# Patient Record
Sex: Female | Born: 1950
Health system: Southern US, Community
[De-identification: ages and names within clinical notes are randomized; demographics above are authoritative.]

## PROBLEM LIST (undated history)

## (undated) DIAGNOSIS — I10 Essential (primary) hypertension: Secondary | ICD-10-CM

## (undated) DIAGNOSIS — G5603 Carpal tunnel syndrome, bilateral upper limbs: Secondary | ICD-10-CM

## (undated) DIAGNOSIS — R112 Nausea with vomiting, unspecified: Secondary | ICD-10-CM

## (undated) DIAGNOSIS — I480 Paroxysmal atrial fibrillation: Secondary | ICD-10-CM

## (undated) DIAGNOSIS — Z95818 Presence of other cardiac implants and grafts: Secondary | ICD-10-CM

## (undated) DIAGNOSIS — K219 Gastro-esophageal reflux disease without esophagitis: Secondary | ICD-10-CM

## (undated) DIAGNOSIS — C801 Malignant (primary) neoplasm, unspecified: Secondary | ICD-10-CM

## (undated) DIAGNOSIS — M1990 Unspecified osteoarthritis, unspecified site: Secondary | ICD-10-CM

## (undated) DIAGNOSIS — I493 Ventricular premature depolarization: Secondary | ICD-10-CM

## (undated) DIAGNOSIS — H409 Unspecified glaucoma: Secondary | ICD-10-CM

## (undated) DIAGNOSIS — T4145XA Adverse effect of unspecified anesthetic, initial encounter: Secondary | ICD-10-CM

## (undated) DIAGNOSIS — E119 Type 2 diabetes mellitus without complications: Secondary | ICD-10-CM

## (undated) DIAGNOSIS — Z9889 Other specified postprocedural states: Secondary | ICD-10-CM

## (undated) HISTORY — PX: BREAST LUMPECTOMY: SHX2

## (undated) HISTORY — PX: RETINAL DETACHMENT SURGERY: SHX105

## (undated) HISTORY — PX: OTHER SURGICAL HISTORY: SHX169

## (undated) HISTORY — DX: Ventricular premature depolarization: I49.3

## (undated) HISTORY — DX: Gastro-esophageal reflux disease without esophagitis: K21.9

## (undated) HISTORY — PX: CATARACT EXTRACTION: SUR2

## (undated) HISTORY — PX: EYE SURGERY: SHX253

## (undated) HISTORY — DX: Paroxysmal atrial fibrillation: I48.0

## (undated) HISTORY — DX: Essential (primary) hypertension: I10

## (undated) HISTORY — PX: TUBAL LIGATION: SHX77

---

## 1991-12-18 HISTORY — PX: BREAST EXCISIONAL BIOPSY: SUR124

## 1994-12-17 HISTORY — PX: BREAST EXCISIONAL BIOPSY: SUR124

## 2003-02-25 ENCOUNTER — Encounter: Admission: RE | Admit: 2003-02-25 | Discharge: 2003-02-25 | Payer: Self-pay | Admitting: *Deleted

## 2003-09-15 ENCOUNTER — Encounter: Admission: RE | Admit: 2003-09-15 | Discharge: 2003-09-15 | Payer: Self-pay | Admitting: Family Medicine

## 2003-09-15 ENCOUNTER — Encounter: Payer: Self-pay | Admitting: Family Medicine

## 2004-02-28 ENCOUNTER — Encounter: Admission: RE | Admit: 2004-02-28 | Discharge: 2004-02-28 | Payer: Self-pay | Admitting: Family Medicine

## 2005-09-12 ENCOUNTER — Encounter: Admission: RE | Admit: 2005-09-12 | Discharge: 2005-09-12 | Payer: Self-pay | Admitting: Family Medicine

## 2006-06-26 ENCOUNTER — Encounter: Admission: RE | Admit: 2006-06-26 | Discharge: 2006-06-26 | Payer: Self-pay | Admitting: Family Medicine

## 2007-07-16 ENCOUNTER — Encounter: Admission: RE | Admit: 2007-07-16 | Discharge: 2007-07-16 | Payer: Self-pay | Admitting: Family Medicine

## 2007-07-25 ENCOUNTER — Encounter: Admission: RE | Admit: 2007-07-25 | Discharge: 2007-07-25 | Payer: Self-pay | Admitting: Family Medicine

## 2007-08-28 ENCOUNTER — Ambulatory Visit: Payer: Self-pay | Admitting: Cardiology

## 2007-09-11 ENCOUNTER — Ambulatory Visit: Payer: Self-pay | Admitting: Cardiology

## 2007-12-15 ENCOUNTER — Ambulatory Visit: Payer: Self-pay | Admitting: Cardiology

## 2008-02-27 ENCOUNTER — Encounter: Payer: Self-pay | Admitting: Cardiology

## 2008-03-16 ENCOUNTER — Ambulatory Visit: Payer: Self-pay | Admitting: Cardiology

## 2008-08-27 ENCOUNTER — Ambulatory Visit: Payer: Self-pay | Admitting: Cardiology

## 2008-08-30 ENCOUNTER — Encounter: Payer: Self-pay | Admitting: Cardiology

## 2008-09-03 ENCOUNTER — Encounter: Payer: Self-pay | Admitting: Cardiology

## 2008-09-30 ENCOUNTER — Ambulatory Visit: Payer: Self-pay | Admitting: Cardiology

## 2008-09-30 ENCOUNTER — Encounter: Payer: Self-pay | Admitting: Cardiology

## 2008-10-25 ENCOUNTER — Encounter: Payer: Self-pay | Admitting: Cardiology

## 2008-10-27 ENCOUNTER — Encounter: Admission: RE | Admit: 2008-10-27 | Discharge: 2008-10-27 | Payer: Self-pay | Admitting: Family Medicine

## 2009-04-20 ENCOUNTER — Encounter: Payer: Self-pay | Admitting: Cardiology

## 2009-05-17 ENCOUNTER — Ambulatory Visit: Payer: Self-pay | Admitting: Cardiology

## 2009-09-29 DIAGNOSIS — I10 Essential (primary) hypertension: Secondary | ICD-10-CM | POA: Insufficient documentation

## 2009-09-29 DIAGNOSIS — I2510 Atherosclerotic heart disease of native coronary artery without angina pectoris: Secondary | ICD-10-CM | POA: Insufficient documentation

## 2009-09-29 DIAGNOSIS — I4891 Unspecified atrial fibrillation: Secondary | ICD-10-CM | POA: Insufficient documentation

## 2009-11-01 ENCOUNTER — Encounter: Admission: RE | Admit: 2009-11-01 | Discharge: 2009-11-01 | Payer: Self-pay | Admitting: Family Medicine

## 2010-01-09 ENCOUNTER — Ambulatory Visit: Payer: Self-pay | Admitting: Cardiology

## 2010-05-01 ENCOUNTER — Encounter: Payer: Self-pay | Admitting: Cardiology

## 2010-08-10 ENCOUNTER — Encounter: Admission: RE | Admit: 2010-08-10 | Discharge: 2010-11-08 | Payer: Self-pay | Admitting: Orthopedic Surgery

## 2010-11-15 ENCOUNTER — Encounter: Admission: RE | Admit: 2010-11-15 | Discharge: 2010-11-15 | Payer: Self-pay | Admitting: Family Medicine

## 2011-01-16 NOTE — Medication Information (Signed)
Summary: RX Folder/ DILTIAZEM  RX Folder/ DILTIAZEM   Imported By: Dorise Hiss 05/02/2010 08:54:33  _____________________________________________________________________  External Attachment:    Type:   Image     Comment:   External Document

## 2011-01-16 NOTE — Assessment & Plan Note (Signed)
Summary: 6 MONTH FU RECV LETTER VS   Visit Type:  Follow-up Primary Provider:  Dimas Aguas  CC:  follow-up visit.  History of Present Illness: the patient is a 60 year old female is referred to split fibrillation.  The patient reports no recurrence.  She did do one occasion have to take Cardizem due to palpitations.  This was under significant stress which had to fly to [place] become her stepson with a myocardial infarction.  She denies otherwise any shortness of breath orthopnea PND shows no palpitations or syncope.  She previously had a normal stress echocardiogram.  Clinical Review Panels:  CXR CXR results Normal heart size and vascularity.  Mild hyperinflation         without focal pneumonia, edema, effusion or pneumothorax.  Trachea         is midline.  Atherosclerosis of the aorta.                   IMPRESSION:         Stable hyperinflation.  No acute chest process. (10/25/2008)  Stress Echocardiogram Stress Echocardiogram Conclusions:         1. With stress there was no chest pain. There was no EKG change. There         was no ectopy.         2. The rest echo images reveal normal global LV function. There is EF         is 60-65% with no focal wall abnormalities. With stress there is         increase in contractility and thickening in all segements. There is         decrease in cavity size in in all views. There is increased EF.         3. This is a normal stress echo. There is no ischemia. (09/30/2008)    Preventive Screening-Counseling & Management  Alcohol-Tobacco     Smoking Status: quit     Year Quit: 1978  Current Medications (verified): 1)  Diltiazem Hcl 30 Mg Tabs (Diltiazem Hcl) .... Take 1 Tablet By Mouth As Needed Palpitations 2)  Metoprolol Tartrate 25 Mg Tabs (Metoprolol Tartrate) .... Take 1 Tablet By Mouth Two Times A Day 3)  Aspir-Trin 325 Mg Tbec (Aspirin) .... Take 1 Tablet By Mouth Once A Day 4)  Centrum Silver  Tabs (Multiple Vitamins-Minerals) ....  Take 1 Tablet By Mouth Once A Day 5)  Caltrate 600+d 600-400 Mg-Unit Tabs (Calcium Carbonate-Vitamin D) .... Take 1 Tablet By Mouth Two Times A Day 6)  Fish Oil 1000 Mg Caps (Omega-3 Fatty Acids) .... Take 2 Tablet By Mouth Two Times A Day 7)  Omeprazole 40 Mg Cpdr (Omeprazole) .... Take 1 Tablet By Mouth Once A Day 8)  Simvastatin 20 Mg Tabs (Simvastatin) .... Take 1 Tablet By Mouth Once A Day 9)  Lisinopril-Hydrochlorothiazide 20-25 Mg Tabs (Lisinopril-Hydrochlorothiazide) .... Take 1 Tablet By Mouth Once A Day 10)  Istalol 0.5 % Soln (Timolol Maleate) .... One Gtt Ou Every Am 11)  Proventil Hfa 108 (90 Base) Mcg/act Aers (Albuterol Sulfate) .... As Needed 12)  Lorazepam 1 Mg Tabs (Lorazepam) .... As Needed 13)  Zolpidem Tartrate 5 Mg Tabs (Zolpidem Tartrate) .... As Needed  Allergies (verified): No Known Drug Allergies  Comments:  Nurse/Medical Assistant: The patient's medications and allergies were reviewed with the patient and were updated in the Medication and Allergy Lists. List reviewed.  Past History:  Past Medical History: Last updated: 09/29/2009 HYPERTENSION, UNSPECIFIED (  ICD-401.9) CAD, NATIVE VESSEL (ICD-414.01) ATRIAL FIBRILLATION (ICD-427.31) GERD Asthma  Past Surgical History: Last updated: 09/29/2009 tubal maxiofacial eye Cataract Extraction Lumpectomy  Family History: Last updated: 09/29/2009 Family History of Cancer:  CHF  Social History: Last updated: 09/29/2009 Retired  Married  Tobacco Use - Former.  Alcohol Use - no Regular Exercise - yes Drug Use - no  Risk Factors: Exercise: yes (09/29/2009)  Risk Factors: Smoking Status: quit (01/09/2010)  Review of Systems       The patient complains of palpitations.  The patient denies fatigue, malaise, fever, weight gain/loss, vision loss, decreased hearing, hoarseness, chest pain, shortness of breath, prolonged cough, wheezing, sleep apnea, coughing up blood, abdominal pain, blood in stool,  nausea, vomiting, diarrhea, heartburn, incontinence, blood in urine, muscle weakness, joint pain, leg swelling, rash, skin lesions, headache, fainting, dizziness, depression, anxiety, enlarged lymph nodes, easy bruising or bleeding, and environmental allergies.    Vital Signs:  Patient profile:   60 year old female Height:      64 inches Weight:      162 pounds BMI:     27.91 Pulse rate:   69 / minute BP sitting:   109 / 72  (left arm) Cuff size:   regular  Vitals Entered By: Carlye Grippe (January 09, 2010 3:02 PM)  Nutrition Counseling: Patient's BMI is greater than 25 and therefore counseled on weight management options. CC: follow-up visit   Physical Exam  Additional Exam:  General: Well-developed, well-nourished in no distress head: Normocephalic and atraumatic eyes PERRLA/EOMI intact, conjunctiva and lids normal nose: No deformity or lesions mouth normal dentition, normal posterior pharynx neck: Supple, no JVD.  No masses, thyromegaly or abnormal cervical nodes lungs: Normal breath sounds bilaterally without wheezing.  Normal percussion heart: regular rate and rhythm with normal S1 and S2, no S3 or S4.  PMI is normal.  No pathological murmurs abdomen: Normal bowel sounds, abdomen is soft and nontender without masses, organomegaly or hernias noted.  No hepatosplenomegaly musculoskeletal: Back normal, normal gait muscle strength and tone normal pulsus: Pulse is normal in all 4 extremities Extremities: No peripheral pitting edema neurologic: Alert and oriented x 3 skin: Intact without lesions or rashes cervical nodes: No significant adenopathy psychologic: Normal affect    Impression & Recommendations:  Problem # 1:  ATRIAL FIBRILLATION (ICD-427.31) patient's EKG today's within normal limits.  She may have had a single recurrence of atrial fibrillation but the p.r.n. Cardizem with resolution.  I made no further medical adjustments. Her updated medication list for this  problem includes:    Metoprolol Tartrate 25 Mg Tabs (Metoprolol tartrate) .Marland Kitchen... Take 1 tablet by mouth two times a day    Aspir-trin 325 Mg Tbec (Aspirin) .Marland Kitchen... Take 1 tablet by mouth once a day  Orders: EKG w/ Interpretation (93000)  Problem # 2:  HYPERTENSION, UNSPECIFIED (ICD-401.9) blood pressure well controlled on her current medical regimen. Her updated medication list for this problem includes:    Diltiazem Hcl 30 Mg Tabs (Diltiazem hcl) .Marland Kitchen... Take 1 tablet by mouth as needed palpitations    Metoprolol Tartrate 25 Mg Tabs (Metoprolol tartrate) .Marland Kitchen... Take 1 tablet by mouth two times a day    Aspir-trin 325 Mg Tbec (Aspirin) .Marland Kitchen... Take 1 tablet by mouth once a day    Lisinopril-hydrochlorothiazide 20-25 Mg Tabs (Lisinopril-hydrochlorothiazide) .Marland Kitchen... Take 1 tablet by mouth once a day  Problem # 3:  CAD, NATIVE VESSEL (ICD-414.01) the patient has a history of nonobstructive coronary artery disease diagnosed previously by  cardiac CT scan in 2007. Her updated medication list for this problem includes:    Diltiazem Hcl 30 Mg Tabs (Diltiazem hcl) .Marland Kitchen... Take 1 tablet by mouth as needed palpitations    Metoprolol Tartrate 25 Mg Tabs (Metoprolol tartrate) .Marland Kitchen... Take 1 tablet by mouth two times a day    Aspir-trin 325 Mg Tbec (Aspirin) .Marland Kitchen... Take 1 tablet by mouth once a day    Lisinopril-hydrochlorothiazide 20-25 Mg Tabs (Lisinopril-hydrochlorothiazide) .Marland Kitchen... Take 1 tablet by mouth once a day  Patient Instructions: 1)  Your physician recommends that you continue on your current medications as directed. Please refer to the Current Medication list given to you today. 2)  Your physician wants you to follow-up in: 1year. You will receive a reminder letter in the mail about two months in advance. If you don't receive a letter, please call our office to schedule the follow-up appointment.

## 2011-01-16 NOTE — Procedures (Signed)
Summary: Holter and Event/ [facility]ARD[facility] END OF SERVICE SUMMARY REPORT  Holter and Event/ CARDIONET END OF SERVICE SUMMARY REPORT   Imported By: Dorise Hiss 01/09/2010 09:24:29  _____________________________________________________________________  External Attachment:    Type:   Image     Comment:   External Document

## 2011-01-16 NOTE — Miscellaneous (Signed)
Summary: Refill:diltiazem  Clinical Lists Changes  Medications: Changed medication from DILTIAZEM HCL 30 MG TABS (DILTIAZEM HCL) Take 1 tablet by mouth to DILTIAZEM HCL 30 MG TABS (DILTIAZEM HCL) Take 1 tablet by mouth as needed palpitations - Signed Rx of DILTIAZEM HCL 30 MG TABS (DILTIAZEM HCL) Take 1 tablet by mouth as needed palpitations;  #30 x 3;  Signed;  Entered by: Cyril Loosen, RN, BSN;  Authorized by: Lewayne Bunting, MD, [facility];  Method used: Electronically to [facility] [address], [place], [place], [place]  [address], Ph: [phone], Fax: [phone]    Prescriptions: DILTIAZEM HCL 30 MG TABS (DILTIAZEM HCL) Take 1 tablet by mouth as needed palpitations  #30 x 3   Entered by:   Cyril Loosen, RN, BSN   Authorized by:   Lewayne Bunting, MD, [facility]   Signed by:   Cyril Loosen, RN, BSN on 05/17/2009   Method used:   Electronically to        [facility] (retail)       [address]       [place], [place]  [address]       Ph: [phone]       Fax: [phone]   RxID:   [id number]

## 2011-05-01 NOTE — Assessment & Plan Note (Signed)
[facility]                          [facility] NOTE   [facility], [facility]                           MRN:          [id number]  DATE:12/15/2007                            DOB:          1951-05-05    REFERRING PHYSICIAN:  Selinda Flavin   HISTORY OF PRESENT ILLNESS:  The patient is a pleasant 60 year old  female with a history of minimal coronary plaquing by cardiac CT in  October of 2007.  The patient was admitted on September 11 with  paroxysmal atrial fibrillation.  She has seen [place] in followup.  In the  meantime, she was placed on metoprolol ER 12.5 mg p.o. daily.  Since  this last office visit, the patient reports on October 7, episodes of  atrial fibrillation, which lasted 2 to 3 months.  Dr. Dimas Aguas saw the  patient in followup on October 8 and increased her metoprolol ER to 25  mg p.o. daily.  She also had a gallbladder ultrasound done in the  meanwhile, which showed a liver lesion, for which she is receiving  further workup at the present time.  However, she has minimal symptoms  as it relates to atrial fibrillation with very rare palpitations.  The  patient states that she is concerned about this.  She, however, has no  chest pain with this or shortness of breath, and it appears that she is  somewhat anxious about her arrhythmia.   MEDICATIONS:  1. Zocor 20 mg p.o. daily.  2. Aspirin 81 mg p.o. daily.  3. Centrum Silver.  4. Calcium.  5. Vitamin D.  6. Fish oil 2000 mg p.o. b.i.d.  7. Metoprolol 25 mg p.o. daily.  8. Omeprazole 40 mg p.o. daily.   PHYSICAL EXAMINATION:  VITAL SIGNS:  Blood pressure 129/76, heart rate  68, weight 155 pounds.  NECK:  Normal carotid upstroke.  No carotid bruits.  LUNGS:  Clear breath sounds bilaterally.  HEART:  Regular rate and rhythm.  Normal S1, S2.  No murmurs, rubs, or  gallops.  ABDOMEN:  Soft and nontender.  No rebound or guarding.  Good bowel  sounds.  EXTREMITIES:  No cyanosis, clubbing,  or edema.  NEURO:  The patient is alert and oriented, grossly nonfocal.   PROBLEM LIST:  1. Paroxysmal atrial fibrillation in normal sinus rhythm.  2. [place] score is 0.  3. Left ventricular function, ejection fraction 55-65%.  4. Treated dyslipidemia.  5. Mild coronary plaquing by cardiac CT.  6. Gastroesophageal reflux disease.  7. Glaucoma.  8. Asthma.  9. Palpitations.  10.Atypical chest pain.   PLAN:  1. The patient's arrhythmia is reasonably well controlled.  She has      very rare palpitations.  2. I told the patient that we could potentially escalate her medical      therapy, particularly if her symptoms are more nocturnal, for which      we can add low-dose Cardizem at night.  If her symptoms are more      frequent and symptomatic, consideration can be given to      antiarrhythmic  drug therapy or catheter radiofrequency ablation.      On the latter issue, I have given the patient some information,      although I told her she is clearly not a candidate for this      procedure yet.  3. The patient can follow up with [place] in the next 6 months to 1 year.     Learta Codding, MD,FACC  Electronically Signed    GED/MedQ  DD: 12/15/2007  DT: 12/15/2007  Job #: [id number]

## 2011-05-01 NOTE — Assessment & Plan Note (Signed)
[facility]                          [facility] NOTE   [facility], [facility]                           MRN:          [id number]  DATE:08/27/2008                            DOB:          [birth year]    PRIMARY CARDIOLOGIST:  Learta Codding, MD, [facility]   REASON FOR VISIT:  Scheduled followup.   [facility] continues to do extremely well, since last seen here in the  clinic in March 2009.  She has not had any interim development of tachy  palpitations.  She continues to exercise on a fairly regular basis, with  no associated chest discomfort or significant dyspnea.   [facility] has been formally evaluated for possible diabetes mellitus and  informs me that this workup was negative.  Thus, her CHADS2 score  remains at 1, secondary to hypertension.   MEDICATIONS:  1. Aspirin 325 daily.  2. Metoprolol 25 mg q.a.m./12.5 mg q.p.m.  3. Diltiazem 30 mg p.r.n.  4. Fish oil 2000 mg b.i.d.  5. Zocor 20 mg nightly.   PHYSICAL EXAMINATION:  VITAL SIGNS:  Blood pressure 122/69, pulse 69 and  regular, and weight 160.  GENERAL:  A 60 year old female, sitting upright, in no distress.  HEENT:  Normocephalic and atraumatic.  NECK:  Palpable bilateral carotid pulses without bruits; no JVD.  LUNGS:  Clear to auscultation in all fields.  HEART:  Regular rate and rhythm.  No significant murmurs.  ABDOMEN:  Soft, nontender.  EXTREMITIES:  Palpable pulses without edema.  NEUROLOGIC:  No focal deficits.   IMPRESSION:  1. History of paroxysmal atrial fibrillation.      a.     CHAD2 score: 1.  2. Normal left ventricular function.  3. Hypertension.  4. History of nonobstructive coronary artery disease.      a.     By cardiac CT scan, October 2007.  5. Gastroesophageal reflux disease.  6. Asthma.   PLAN:  1. Adjust metoprolol to a dosing regimen of 25 mg b.i.d.  I do not      feel that her current manner of taking this as 12.5 mg in the      evening is providing very  much benefit.  Moreover, I am concerned      as to the reliability of such a small dose, given that she breaks      these down from an initial tablet of 50 mg.  2. Return clinic followup with myself and Dr. Andee Lineman in 6 months, or      sooner as needed.      Gene Serpe, PA-C  Electronically Signed      Learta Codding, MD,FACC  Electronically Signed   GS/MedQ  DD: 08/27/2008  DT: 08/28/2008  Job #: [id number]   cc:   [facility]

## 2011-05-01 NOTE — Assessment & Plan Note (Signed)
[facility]                          [facility] NOTE   PARMINDER, TRAPANI                           MRN:          [id number]  DATE:09/11/2007                            DOB:          03-03-1951    CARDIOLOGIST:  Dr. Lewayne Bunting.   PRIMARY CARE PHYSICIAN:  Dr. Selinda Flavin.   REASON FOR VISIT:  Post-hospitalization follow up.   HISTORY OF PRESENT ILLNESS:  [name] is a very pleasant 60 year old  female patient with history of minimal coronary plaquing by cardiac CT  done in October 2007, with less than 25% LAD stenosis and a calcium  score of 57, as well as a negative adequate exercise Cardiolite study in  July 2005 who presented to [facility] recently with complaints  of chest pain and tachy-palpitations. She was found in atrial  fibrillation with RVR of new onset. She converted to normal sinus rhythm  on a Diltiazem drip. She ruled out for myocardial infarction by enzymes.  She returns today for follow up.   The patient notes that shortly after discharge, she was taking Diltiazem  and developed a yellowish skin discoloration. Her primary care physician  told her to stop the medication and she was sent to the hospital for  blood work. Of note, her complete metabolic panel was normal, as well as  her CBC.   Today, she notes that she is doing well. She has not had any recurrent  tachy-palpitations like she did when she presented to the hospital. She  has had occasional palpitations that she describes as a flip-flopping.  She also notes atypical that she has had for years. Dr. Dimas Aguas has  actually set her up for a gallbladder ultrasound which was done today.  He has also placed her on omeprazole. She has noticed some association  with meals and has also noted some increased belching. She denies any  exertional chest heaviness or tightness. She denies any syncope. She  denies any significant dyspnea on exertion. She describes NYHA  class 1  to 2 symptoms.   CURRENT MEDICATIONS:  1. Zocor 20 mg daily.  2. Aspirin 81 mg daily.  3. Centrum Silver.  4. Calcium.  5. Fish oil.  6. Azopt eye drops.  7. Tretinoin cream.  8. Omeprazole 20 mg daily.  9. Ambien p.r.n.  10.Lorazepam p.r.n.  11.Albuterol p.r.n.   ALLERGIES:  Questionable allergy to DILTIAZEM.   PHYSICAL EXAMINATION:  GENERAL:  She is a well developed, well nourished  female in no acute distress.  VITAL SIGNS:  Blood pressure 127/80, pulse 67, weight 153 pounds.  HEENT:  Normal.  NECK:  Without JVD.  CARDIAC:  S1, S2. Regular rate and rhythm without murmurs.  LUNGS:  Clear to auscultation bilaterally without wheezing, rhonchi, or  rales.  ABDOMEN:  Soft and nontender with normal active bowel sounds. No  organomegaly.  EXTREMITIES:  Without edema.   Electrocardiogram reveals sinus rhythm with a heart rate of 65, normal  axis, no acute changes.   IMPRESSION:  1. Paroxysmal atrial fibrillation.      a.  Maintaining normal sinus rhythm.  2. CHADS2 score is 0.  3. Good LV function with an EF of 55% to 65%.  4. Treated dyslipidemia.  5. Minimal coronary plaquing by cardiac CT done in October 2007.      a.     Less than 25% LAD stenosis; calcium score 57; otherwise       normal coronary arteries.  6. GERD.  7. Glaucoma.  8. Asthma.  9. Palpitations.  10.Chest pain, etiology unclear.   PLAN:  The patient presents to the office today for follow up. She is  maintaining normal sinus rhythm. She continues to have atypical chest  pain. Dr. Dimas Aguas has set her up for a gallbladder ultrasound, as well as  placed her on omeprazole. She did have problems with Diltiazem. At this  point in time, we plan to:  1. Initiate Toprol XL 25 mg 1/2 tablet daily to try to maintain normal      sinus rhythm, as well as to treat her palpitations. If she cannot      tolerate this low dose of beta blocker than we will discontinue it.  2. Continue follow up with Dr.  Dimas Aguas for other causes of her chest      pain and risk factor      modification.  3. Follow up with Dr. Andee Lineman in three months or sooner p.r.n.      Tereso Newcomer, PA-C  Electronically Signed      Learta Codding, MD,FACC  Electronically Signed   SW/MedQ  DD: 09/11/2007  DT: 09/12/2007  Job #: [id number]   cc:   Selinda Flavin

## 2011-05-01 NOTE — Assessment & Plan Note (Signed)
[facility]                          [facility] NOTE   Heather [facility], Heather Cunningham                           MRN:          [id number]  DATE:03/16/2008                            DOB:          1951/05/09    PRIMARY CARDIOLOGIST:  Dr. Lewayne Bunting.   REASON FOR VISIT:  Post-hospital followup.   Heather Cunningham is a very pleasant 60 year old female, with history of  nonobstructive coronary artery disease and paroxysmal atrial  fibrillation, who recently presented to [facility] with  recurrent PAF.  This is her second documented episode of PAF, the first  occurring in September 2008 at which time she was referred to [place] in  consultation here at [facility].  Her CHAD2 score at that time  was assessed as 0.  More recently, however, she has been diagnosed with  mild hypertension and is also currently undergoing evaluation for  possible type 2 diabetes mellitus.  She otherwise has no prior history  of congestive heart failure or stroke.   On February 10, she presented to the emergency room with complaint of  sustained palpitations.  She was found to be in PAF at a rate of 130  bpm, which successfully converted to NSR after treatment with a one-time  dose of 5 mg IV Lopressor.  She has since had up titration of her  maintenance metoprolol with an additional 25 milligrams nightly, per Dr.  Dimas Aguas.  This seems to have significantly reduced the number of  palpitations, but not completely eliminated them.   The patient was also referred for an outpatient CardioNet monitor, which  was completed, and which revealed no evidence of recurrent PAF with only  rare PVCs.   Clinically, the patient feels much improved and has not had any  sustained tachy palpitations similar to her recent presentation.   CURRENT MEDICATIONS:  1. Aspirin 81 mg daily.  2. Zocor 20 nightly.  3. Fish oil 2000 b.i.d.  4. Metoprolol 50 mg q. a.m. 50 mg nightly.  5. Omeprazole 40  mg daily.   PHYSICAL EXAM:  Blood pressure 143/87, pulse 62, regular weight 157.  GENERAL:  A 60 year old female sitting upright in no distress.  HEENT:  Normocephalic, atraumatic.  NECK:  Palpable bilateral carotid pulses without bruits; no JVD.  LUNGS:  Clear to auscultation in all fields.  HEART:  Regular rate and rhythm (S1, S2).  No significant murmurs.  No  rubs.  ABDOMEN:  Benign.  EXTREMITIES:  Palpable pulses without edema.  NEURO:  No focal deficit.   IMPRESSION:  1. Recurrent paroxysmal atrial fibrillation with rapid ventricular      response.      a.     Successfully converted to normal sinus rhythm with IV       Lopressor.      b.     CHADS2 score:  at least 1, with possible type 2 diabetes       mellitus.  2. Normal ventricular function.      a.     Ejection fraction 55-60% by 2-D echo, September 2008.  3. Hypertension, new onset.  4. Nonobstructive coronary artery disease.      a.     By cardiac CT, October 2007.  5. Gastroesophageal reflux disease.  6. Asthma.   PLAN:  1. Increase aspirin to full-dose.  2. Add Diltiazem 30 mg p.r.n. for recurrent, sustained a tachy      palpitations.  3. Closely monitor for recurrent paroxysmal atrial fibrillation and,      if the patient is formally diagnosed with type 2 diabetes mellitus,      consider Coumadin anticoagulation.  Her CHAD2 score at that point      would be 2.  4. Schedule return clinic followup with myself and Dr. Andee Lineman in 2      months.      Gene Serpe, PA-C  Electronically Signed      Learta Codding, MD,FACC  Electronically Signed   GS/MedQ  DD: 03/16/2008  DT: 03/16/2008  Job #: [id number]   cc:   Selinda Flavin

## 2011-05-01 NOTE — Assessment & Plan Note (Signed)
[facility]                          [facility]ICE NOTE   Heather Cunningham, Heather Cunningham                           MRN:          [id number]  DATE:05/17/2009                            DOB:          1951/09/17    REFERRING PHYSICIAN:  Selinda Flavin   HISTORY OF PRESENT ILLNESS:  The patient is a 60 year old female with a  history of paroxysmal atrial fibrillation without any recent recurrence.  The patient did have episodes of severe hypertension associated with  headache last year.  She was referred for a CT scan which was normal.  Dr. Dimas Aguas also scheduled her for a stress echocardiogram which  demonstrated normal LV function and no segmental wall motion  abnormalities as the study was negative for ischemia.  The patient has  now been doing well.  She has been placed on combination therapy with  lisinopril and hydrochlorothiazide.  She does state that she feels  fatigued at times and contributes this to metoprolol.  She has had no  recurrent palpitations, syncope, orthopnea, PND, or chest pain.   MEDICATIONS:  1. Aspirin 325 mg p.o. daily.  2. Centrum Silver daily.  3. Calcium and vitamin D 5 mg p.o. b.i.d.  4. Fish oil 2000 mg p.o. b.i.d.  5. Eye drops.  6. Omeprazole 40 mg p.o. daily.  7. Simvastatin 20 mg p.o. nightly.  8. Metoprolol 50 mg p.o. b.i.d.  9. Lisinopril hydrochlorothiazide 20/25 mg p.o. daily.   PHYSICAL EXAMINATION:  VITAL SIGNS:  Blood pressure is 109/69, heart  rate is 56, weight 159 pounds.  NECK:  Normal carotid upstroke and no carotid bruits.  LUNGS:  Clear breath sounds bilaterally.  HEART:  Regular rate and rhythm.  Normal S1 and S2.  No murmurs, rubs,  or gallops.  ABDOMEN:  Soft and nontender.  No rebound or guarding.  Good bowel  sounds.  EXTREMITIES:  No cyanosis, clubbing, or edema.   PROBLEM LIST:  1. History of paroxysmal atrial fibrillation.      a.     CHADS2 score of 1.  2. Normal left ventricular function  with normal stress      echocardiographic study October 2009.  3. Hypertension controlled.  4. History of nonobstructive coronary artery disease by cardiac CT      scan, October 2007.  5. Gastroesophageal reflux disease.  6. Asthma.   PLAN:  1. The patient does complain of some fatigue and we will cut back her      metoprolol to 25 mg p.o. b.i.d.  2. I gave the patient a refill of Cardizem at 30 mg.  I told the      patient that if she feels better with lower dose of metoprolol      and/or she has recurrence of atrial fibrillation, we might switch      her to Cardizem altogether.  3. EKG was reviewed and is essentially within normal limits.  4. The patient to follow up with [place] in 6 months.     Learta Codding, MD,FACC  Electronically Signed    GED/MedQ  DD: 05/17/2009  DT: 05/18/2009  Job #: [id number]   cc:   Selinda Flavin

## 2011-05-31 ENCOUNTER — Encounter: Payer: Self-pay | Admitting: Cardiology

## 2011-07-09 ENCOUNTER — Encounter: Payer: Self-pay | Admitting: Cardiology

## 2011-07-13 ENCOUNTER — Ambulatory Visit: Payer: Self-pay | Admitting: Cardiology

## 2011-09-07 ENCOUNTER — Encounter: Payer: Self-pay | Admitting: Cardiology

## 2011-09-07 ENCOUNTER — Ambulatory Visit (INDEPENDENT_AMBULATORY_CARE_PROVIDER_SITE_OTHER): Payer: 59 | Admitting: Cardiology

## 2011-09-07 VITALS — BP 115/76 | HR 62 | Ht 64.0 in | Wt 165.1 lb

## 2011-09-07 DIAGNOSIS — I10 Essential (primary) hypertension: Secondary | ICD-10-CM

## 2011-09-07 DIAGNOSIS — I4891 Unspecified atrial fibrillation: Secondary | ICD-10-CM

## 2011-09-07 MED ORDER — DILTIAZEM HCL 30 MG PO TABS: 30.0000 mg | ORAL_TABLET | ORAL | Status: DC | PRN

## 2011-09-07 NOTE — Patient Instructions (Signed)
Your physician you to follow up in 1 year. You will receive a reminder letter in the mail one-two months in advance. If you don't receive a letter, please call our office to schedule the follow-up appointment. Your physician recommends that you continue on your current medications as directed. Please refer to the Current Medication list given to you today. 

## 2011-09-10 NOTE — Progress Notes (Signed)
HPI The patient is a 60 year old female with a history of paroxysmal atrial fibrillation. EKG today demonstrates normal sinus rhythm. The patient reports a single episode a month ago of rapid tachycardia palpitations and she took 2 short-acting Cardizem. Symptoms resolved and 45 minutes. Short of her risk factor of hypertension and female sex the patient has no other increased risk factors for thromboembolic disease. She has been screened for coronary arteries the cardiac CTA 2007 which was within normal limits. She also had an echo in the past which showed normal LV function. In addition a stress echocardiogram in 2009 was also within normal limits. From a cardiovascular standpoint the patient is otherwise stable. She denies any chest pain shortness of breath orthopnea or PND.  Not on File  Current Outpatient Prescriptions on File Prior to Visit  Medication Sig Dispense Refill  . albuterol (PROVENTIL HFA) 108 (90 BASE) MCG/ACT inhaler Inhale 2 puffs into the lungs as needed.        Marland Kitchen aspirin 325 MG EC tablet Take 325 mg by mouth daily.        . Calcium Carbonate-Vitamin D (CALTRATE 600+D) 600-400 MG-UNIT per tablet Take 1 tablet by mouth 2 (two) times daily.        . fish oil-omega-3 fatty acids 1000 MG capsule Take 2 g by mouth 2 (two) times daily.        Marland Kitchen lisinopril-hydrochlorothiazide (PRINZIDE,ZESTORETIC) 20-25 MG per tablet Take 1 tablet by mouth daily.        Marland Kitchen LORazepam (ATIVAN) 1 MG tablet Take 1 mg by mouth as needed.        . metoprolol tartrate (LOPRESSOR) 25 MG tablet Take 25 mg by mouth 2 (two) times daily.        . Multiple Vitamins-Minerals (CENTRUM SILVER) tablet Take 1 tablet by mouth daily.        Marland Kitchen omeprazole (PRILOSEC) 40 MG capsule Take 40 mg by mouth daily.        . simvastatin (ZOCOR) 20 MG tablet Take 20 mg by mouth daily.        . Timolol Maleate (ISTALOL) 0.5 % (DAILY) SOLN Apply 1 drop to eye every morning. In both eyes.       Marland Kitchen zolpidem (AMBIEN) 10 MG tablet Take 5  mg by mouth as needed.          Past Medical History  Diagnosis Date  . Hypertension     Unspecified  . Coronary artery disease     Native vessel  . Arrhythmia     Atrial fibrillation  . Asthma   . GERD (gastroesophageal reflux disease)     Past Surgical History  Procedure Date  . Tubal ligation   . Maxiofacial   . Eye surgery   . Cataract extraction   . Breast lumpectomy     Family History  Problem Relation Age of Onset  . Cancer Other   . Heart failure Other     History   Social History  . Marital Status: Married    Spouse Name: [name]    Number of Children: 1  . Years of Education: N/A   Occupational History  . [employer] Other   Social History Main Topics  . Smoking status: Former Smoker    Quit date: 12/17/1976  . Smokeless tobacco: Not on file   Comment: QUIT 35 YEARS AGO  . Alcohol Use: No  . Drug Use: No  . Sexually Active: Not on file   Other  Topics Concern  . Not on file   Social History Narrative   Pt gets regular exercise.   ZOX:[id number] positives as outlined above. The remainder of the 18  point review of systems is negative  PHYSICAL EXAM BP 115/76  Pulse 62  Ht 5\' 4"  (1.626 m)  Wt 165 lb 1.9 oz (74.898 kg)  BMI 28.34 kg/m2  General: Well-developed, well-nourished in no distress Head: Normocephalic and atraumatic Eyes:PERRLA/EOMI intact, conjunctiva and lids normal Ears: No deformity or lesions Mouth:normal dentition, normal posterior pharynx Neck: Supple, no JVD.  No masses, thyromegaly or abnormal cervical nodes Lungs: Normal breath sounds bilaterally without wheezing.  Normal percussion Cardiac: regular rate and rhythm with normal S1 and S2, no S3 or S4.  PMI is normal.  No pathological murmurs Abdomen: Normal bowel sounds, abdomen is soft and nontender without masses, organomegaly or hernias noted.  No hepatosplenomegaly MSK: Back normal, normal gait muscle strength and tone normal Vascular: Pulse is  normal in all 4 extremities Extremities: No peripheral pitting edema Neurologic: Alert and oriented x 3 Skin: Intact without lesions or rashes Lymphatics: No significant adenopathy Psychologic: Normal affect    ECG: Normal sinus rhythm.  ASSESSMENT AND PLAN

## 2011-09-10 NOTE — Assessment & Plan Note (Signed)
Paroxysmal atrial fibrillation: Rare episodes of atrial fibrillation controlled with short-acting Cardizem. We are giving refill of her short-acting Cardizem. At this point the patient has no clear indication for Coumadin anticoagulation or one of the newer anticoagulants. I told the patient however that when she reaches a 50 we may revise his recommendation particular she continues to have documented paroxysmal atrial fibrillation

## 2011-09-10 NOTE — Assessment & Plan Note (Signed)
Hypertension: Blood pressure well controlled on medical therapy

## 2011-11-06 ENCOUNTER — Other Ambulatory Visit: Payer: Self-pay | Admitting: Family Medicine

## 2011-11-06 DIAGNOSIS — Z1231 Encounter for screening mammogram for malignant neoplasm of breast: Secondary | ICD-10-CM

## 2011-11-23 ENCOUNTER — Ambulatory Visit
Admission: RE | Admit: 2011-11-23 | Discharge: 2011-11-23 | Disposition: A | Discharge status: Home / Self Care | Payer: 59 | Source: Ambulatory Visit | Attending: Family Medicine | Admitting: Family Medicine

## 2011-11-23 DIAGNOSIS — Z1231 Encounter for screening mammogram for malignant neoplasm of breast: Secondary | ICD-10-CM

## 2012-08-19 ENCOUNTER — Ambulatory Visit (INDEPENDENT_AMBULATORY_CARE_PROVIDER_SITE_OTHER): Payer: 59 | Admitting: Cardiology

## 2012-08-19 ENCOUNTER — Encounter: Payer: Self-pay | Admitting: Cardiology

## 2012-08-19 VITALS — BP 103/68 | HR 71 | Ht 64.0 in | Wt 165.0 lb

## 2012-08-19 DIAGNOSIS — I2510 Atherosclerotic heart disease of native coronary artery without angina pectoris: Secondary | ICD-10-CM

## 2012-08-19 DIAGNOSIS — I10 Essential (primary) hypertension: Secondary | ICD-10-CM

## 2012-08-19 DIAGNOSIS — I4891 Unspecified atrial fibrillation: Secondary | ICD-10-CM

## 2012-08-19 NOTE — Patient Instructions (Signed)
Continue all current medications

## 2012-08-23 NOTE — Assessment & Plan Note (Signed)
Blood pressure well-controlled.  No further adjustments in medications required.  LV function is also normal.

## 2012-08-23 NOTE — Assessment & Plan Note (Signed)
History of paroxysmal atrial fibrillation.  Patient remains in normal sinus rhythm.  She reports one episode of palpitations and Cardizem did not stop her symptoms.  She went to the emergency room and converted there spontaneously.  she has no other recurrences.risks benefits does not favor Coumadin therapy at this point in time

## 2012-08-23 NOTE — Progress Notes (Signed)
Heather Bottoms, MD, [facility] ABIM Board Certified in Adult Cardiovascular Medicine,Internal Medicine and Critical Care Medicine    CC:   Follow up patient with paroxysmal atrial fibrillation.                                                                               HPI:        The patient is a 61 year old female that is approximately fibrillation.  Since her last visit she reports one episode of palpitations which didn't stop with her usual dose of short-acting Cardizem.  The patient went to the emergency room  However by the time she got there her a defibrillation was resolved.  She has noticed some decrease in exercise tolerance but attributes to weight gain.  Otherwise she is asymptomatic and reports no chest pain or shortness of breath. Patient had prior ischemia workup with cardiac CEA 2007 as well as an echo which showed normal LV function stress echocardiogram in 2009 was also within normal limits.  PMH: reviewed and listed in Problem List in Electronic Records (and see below) Past Medical History  Diagnosis Date  . Hypertension     Unspecified  . Coronary artery disease     Native vessel  . Arrhythmia     Atrial fibrillation  . Asthma   . GERD (gastroesophageal reflux disease)    Past Surgical History  Procedure Date  . Tubal ligation   . Maxiofacial   . Eye surgery   . Cataract extraction   . Breast lumpectomy     Allergies/SH/FHX : available in Electronic Records for review  No Known Allergies History   Social History  . Marital Status: Married    Spouse Name: [name]    Number of Children: 1  . Years of Education: N/A   Occupational History  . [employer] Other   Social History Main Topics  . Smoking status: Former Smoker    Types: Cigarettes    Quit date: 12/17/1976  . Smokeless tobacco: Never Used   Comment: QUIT 35 YEARS AGO  . Alcohol Use: No  . Drug Use: No  . Sexually Active: Not on file   Other Topics Concern  . Not on file     Social History Narrative   Pt gets regular exercise.   Family History  Problem Relation Age of Onset  . Cancer Other   . Heart failure Other     Medications: Current Outpatient Prescriptions  Medication Sig Dispense Refill  . albuterol (PROAIR HFA) 108 (90 BASE) MCG/ACT inhaler Inhale 2 puffs into the lungs every 6 (six) hours as needed.      Marland Kitchen aspirin 325 MG EC tablet Take 325 mg by mouth daily.        . Calcium Carbonate-Vitamin D (CALTRATE 600+D) 600-400 MG-UNIT per tablet Take 1 tablet by mouth 2 (two) times daily.        Marland Kitchen diltiazem (CARDIZEM) 30 MG tablet Take 1 tablet (30 mg total) by mouth as needed. Take as needed for palpitations.  30 tablet  3  . fish oil-omega-3 fatty acids 1000 MG capsule Take 2 g by mouth 2 (two) times daily.        Marland Kitchen  lisinopril-hydrochlorothiazide (PRINZIDE,ZESTORETIC) 20-25 MG per tablet Take 1 tablet by mouth daily.        Marland Kitchen LORazepam (ATIVAN) 1 MG tablet Take 1 mg by mouth as needed.        . metoprolol tartrate (LOPRESSOR) 25 MG tablet Take 25 mg by mouth 2 (two) times daily.        . Multiple Vitamins-Minerals (CENTRUM SILVER) tablet Take 1 tablet by mouth daily.        Marland Kitchen omeprazole (PRILOSEC) 40 MG capsule Take 40 mg by mouth daily.        . simvastatin (ZOCOR) 20 MG tablet Take 20 mg by mouth daily.        . Timolol Maleate (ISTALOL) 0.5 % (DAILY) SOLN Apply 1 drop to eye every morning. In both eyes.       Marland Kitchen zolpidem (AMBIEN) 10 MG tablet Take 5 mg by mouth as needed.          ROS: No nausea or vomiting. No fever or chills.No melena or hematochezia.No bleeding.No claudication  Physical Exam: BP 103/68  Pulse 71  Ht 5\' 4"  (1.626 m)  Wt 165 lb (74.844 kg)  BMI 28.32 kg/m2 General:well-nourished white female in no distress Neck:normal carotid upstroke and no carotid bruits. Lungs:clear breath sounds bilaterally, no wheezing Cardiac:regular rate and rhythm with normal S1-S2, no pathological murmurs Vascular:no edema.  Normal distal  pulses Skin:warm and dry Physcologic:normal affect  12lead WUJ:WJXBJY sinus rhythm no acute ischemic changes. Limited bedside ECHO:N/A No images are attached to the encounter.   I reviewed and summarized the old records. I reviewed ECG and prior blood work.  Assessment and Plan  ATRIAL FIBRILLATION History of paroxysmal atrial fibrillation.  Patient remains in normal sinus rhythm.  She reports one episode of palpitations and Cardizem did not stop her symptoms.  She went to the emergency room and converted there spontaneously.  she has no other recurrences.risks benefits does not favor Coumadin therapy at this point in time  CAD, NATIVE VESSEL Patient reports no chest pain.  She has no orthopnea PND no symptoms to suggest ischemic heart disease and no further workup is required.  HYPERTENSION, UNSPECIFIED Blood pressure well-controlled.  No further adjustments in medications required.  LV function is also normal.    Patient Active Problem List  Diagnosis  . HYPERTENSION, UNSPECIFIED  . CAD, NATIVE VESSEL  . ATRIAL FIBRILLATION

## 2012-08-23 NOTE — Assessment & Plan Note (Signed)
Patient reports no chest pain.  She has no orthopnea PND no symptoms to suggest ischemic heart disease and no further workup is required.

## 2012-10-20 ENCOUNTER — Other Ambulatory Visit: Payer: Self-pay | Admitting: Family Medicine

## 2012-10-20 DIAGNOSIS — Z1231 Encounter for screening mammogram for malignant neoplasm of breast: Secondary | ICD-10-CM

## 2012-11-24 ENCOUNTER — Ambulatory Visit
Admission: RE | Admit: 2012-11-24 | Discharge: 2012-11-24 | Disposition: A | Discharge status: Home / Self Care | Payer: 59 | Source: Ambulatory Visit | Attending: Family Medicine | Admitting: Family Medicine

## 2012-11-24 DIAGNOSIS — Z1231 Encounter for screening mammogram for malignant neoplasm of breast: Secondary | ICD-10-CM

## 2013-07-05 DIAGNOSIS — I499 Cardiac arrhythmia, unspecified: Secondary | ICD-10-CM

## 2013-07-28 ENCOUNTER — Encounter: Payer: Self-pay | Admitting: Cardiovascular Disease

## 2013-07-28 DIAGNOSIS — I499 Cardiac arrhythmia, unspecified: Secondary | ICD-10-CM

## 2013-07-29 ENCOUNTER — Encounter: Payer: Self-pay | Admitting: Cardiovascular Disease

## 2013-08-14 ENCOUNTER — Ambulatory Visit (INDEPENDENT_AMBULATORY_CARE_PROVIDER_SITE_OTHER): Payer: 59 | Admitting: Cardiovascular Disease

## 2013-08-14 ENCOUNTER — Encounter: Payer: Self-pay | Admitting: Cardiovascular Disease

## 2013-08-14 VITALS — BP 123/80 | HR 60 | Ht 64.0 in | Wt 169.0 lb

## 2013-08-14 DIAGNOSIS — I10 Essential (primary) hypertension: Secondary | ICD-10-CM

## 2013-08-14 DIAGNOSIS — M47812 Spondylosis without myelopathy or radiculopathy, cervical region: Secondary | ICD-10-CM

## 2013-08-14 DIAGNOSIS — E876 Hypokalemia: Secondary | ICD-10-CM

## 2013-08-14 DIAGNOSIS — M722 Plantar fascial fibromatosis: Secondary | ICD-10-CM

## 2013-08-14 DIAGNOSIS — I480 Paroxysmal atrial fibrillation: Secondary | ICD-10-CM

## 2013-08-14 DIAGNOSIS — R7309 Other abnormal glucose: Secondary | ICD-10-CM

## 2013-08-14 DIAGNOSIS — I4891 Unspecified atrial fibrillation: Secondary | ICD-10-CM

## 2013-08-14 DIAGNOSIS — R079 Chest pain, unspecified: Secondary | ICD-10-CM

## 2013-08-14 MED ORDER — DILTIAZEM HCL 30 MG PO TABS: 30.0000 mg | ORAL_TABLET | ORAL | Status: DC | PRN

## 2013-08-14 NOTE — Patient Instructions (Addendum)
   Labs for Potassium & HgA1c - do prior to going out of town Office will contact with results via phone or letter.   21 day event heart monitor - call when return from [place] so order can be placed for this, monitor will be mailed to your home Refill sent to pharm for your Cardizem  Continue all current medications. Follow up appointment can be given at time of ordering monitor

## 2013-08-14 NOTE — Progress Notes (Signed)
Patient ID: Heather Cunningham, female   DOB: 10-Oct-1951, 62 y.o.   MRN: [id number]   SUBJECTIVE: Mrs. Jalloh has a h/o paroxysmal atrial fibrillation and HTN. She had a prior ischemia workup with a cardiac CEA in 2007 as well as an echo which showed normal LV function and stress echocardiogram in 2009 which was also within normal limits.   She's been having concomitant chest and back pain, which is made worse by holding her arms up when she's cooking or ironing. She has noticed that one arm may occasionally feel stronger than the other. She's not had xrays up to this point. She cannot have an MRI due to a metal band in her head after retinal surgery.  When she's in atrial fibrillation, she feels it in her throat. It used to occur once a year, but this month it happened three times. She's been instructed to take an extra Diltiazem tablet or two should it occur. She was recently hospitalized for this and her potassium level was 3.2. She eats tomatoes, bananas, and other potassium-rich foods.  She's been bothered by plantar fasciitis and has been unable to exercise like she would like to.   No Known Allergies  Current Outpatient Prescriptions  Medication Sig Dispense Refill  . albuterol (PROAIR HFA) 108 (90 BASE) MCG/ACT inhaler Inhale 2 puffs into the lungs every 6 (six) hours as needed.      Marland Kitchen aspirin 325 MG EC tablet Take 325 mg by mouth daily.        . Calcium Carbonate-Vitamin D (CALTRATE 600+D) 600-400 MG-UNIT per tablet Take 1 tablet by mouth 2 (two) times daily.        Marland Kitchen diltiazem (CARDIZEM) 30 MG tablet Take 1 tablet (30 mg total) by mouth as needed. Take as needed for palpitations.  30 tablet  3  . fish oil-omega-3 fatty acids 1000 MG capsule Take 2 g by mouth 2 (two) times daily.        Marland Kitchen lisinopril-hydrochlorothiazide (PRINZIDE,ZESTORETIC) 20-25 MG per tablet Take 1 tablet by mouth daily.        Marland Kitchen LORazepam (ATIVAN) 1 MG tablet Take 1 mg by mouth as needed.        . metoprolol tartrate  (LOPRESSOR) 25 MG tablet Take 50 mg by mouth 2 (two) times daily.       Marland Kitchen omeprazole (PRILOSEC) 40 MG capsule Take 40 mg by mouth daily.        . simvastatin (ZOCOR) 20 MG tablet Take 20 mg by mouth daily.        . Timolol Maleate (ISTALOL) 0.5 % (DAILY) SOLN Apply 1 drop to eye every morning. In both eyes.       Marland Kitchen zolpidem (AMBIEN) 10 MG tablet Take 5 mg by mouth as needed.         No current facility-administered medications for this visit.    Past Medical History  Diagnosis Date  . Hypertension     Unspecified  . Coronary artery disease     Native vessel  . Arrhythmia     Atrial fibrillation  . Asthma   . GERD (gastroesophageal reflux disease)     Past Surgical History  Procedure Laterality Date  . Tubal ligation    . Maxiofacial    . Eye surgery    . Cataract extraction    . Breast lumpectomy      History   Social History  . Marital Status: Married    Spouse Name: [name]  Number of Children: 1  . Years of Education: N/A   Occupational History  . [employer] Other   Social History Main Topics  . Smoking status: Former Smoker    Types: Cigarettes    Quit date: 12/17/1976  . Smokeless tobacco: Never Used     Comment: QUIT 35 YEARS AGO  . Alcohol Use: No  . Drug Use: No  . Sexual Activity: Not on file   Other Topics Concern  . Not on file   Social History Narrative   Pt gets regular exercise.    @FAMX @  Filed Vitals:   08/14/13 1343  BP: 123/80  Pulse: 60  Height: 5\' 4"  (1.626 m)  Weight: 169 lb (76.658 kg)    PHYSICAL EXAM General: NAD Neck: No JVD, no thyromegaly or thyroid nodule.  Lungs: Clear to auscultation bilaterally with normal respiratory effort. CV: Nondisplaced PMI.  Heart regular S1/S2, no S3/S4, no murmur.  No peripheral edema.  No carotid bruit.  Normal pedal pulses.  Abdomen: Soft, nontender, no hepatosplenomegaly, no distention.  Neurologic: Alert and oriented x 3.  Psych: Normal affect. Extremities: No  clubbing or cyanosis.   ECG: reviewed and available in electronic records.      ASSESSMENT AND PLAN: 1. Paroxysmal atrial fibrillation: it has occurred more frequently in the past month, but she was found to be hypokalemic. She had been on 25 mg bid of Metoprolol but it was recently increased to 50 mg bid. She does get somewhat fatigued with this. She is going to [place] from Sept 15-Oct 1. Afterwards, I will have her wear a 3-week event monitor and then have her f/u with me. I will also check a potassium level shortly before her trip, along with an HbA1C. She tells me that non-fasting blood sugars have been elevated. If she has proven diabetes, anticoagulation will be need to be considered, and she is aware of this.  2. HTN: controlled on current therapy. 3. Chest pain: this appears to be musculoskeletal in etiology, as it is clearly exacerbated by the use of her arms, and she may very well have cervical and thoracic spine arthritis/degenerative joint disease. I would recommend x-rays. I do not recommend stress testing at this time, and will continue to monitor this. 4. Cervical and thoracic spine arthritis: I recommend xrays for further evaluation. 5. Plantar fasciitis: she sees a podiatrist for this.    Prentice Docker, M.D., F.A.C.C.

## 2013-08-18 ENCOUNTER — Ambulatory Visit: Payer: 59 | Admitting: Cardiovascular Disease

## 2013-08-24 ENCOUNTER — Encounter: Payer: Self-pay | Admitting: *Deleted

## 2013-09-23 ENCOUNTER — Other Ambulatory Visit: Payer: 59 | Admitting: *Deleted

## 2013-09-23 DIAGNOSIS — I480 Paroxysmal atrial fibrillation: Secondary | ICD-10-CM

## 2013-09-24 DIAGNOSIS — I4891 Unspecified atrial fibrillation: Secondary | ICD-10-CM

## 2013-10-21 ENCOUNTER — Other Ambulatory Visit: Payer: Self-pay

## 2013-10-21 DIAGNOSIS — Z1231 Encounter for screening mammogram for malignant neoplasm of breast: Secondary | ICD-10-CM

## 2013-10-26 ENCOUNTER — Ambulatory Visit (INDEPENDENT_AMBULATORY_CARE_PROVIDER_SITE_OTHER): Payer: 59 | Admitting: Cardiovascular Disease

## 2013-10-26 ENCOUNTER — Encounter: Payer: Self-pay | Admitting: Cardiovascular Disease

## 2013-10-26 VITALS — BP 102/60 | HR 61 | Ht 64.0 in | Wt 166.0 lb

## 2013-10-26 DIAGNOSIS — I4891 Unspecified atrial fibrillation: Secondary | ICD-10-CM

## 2013-10-26 DIAGNOSIS — I480 Paroxysmal atrial fibrillation: Secondary | ICD-10-CM

## 2013-10-26 DIAGNOSIS — M47812 Spondylosis without myelopathy or radiculopathy, cervical region: Secondary | ICD-10-CM

## 2013-10-26 DIAGNOSIS — M722 Plantar fascial fibromatosis: Secondary | ICD-10-CM

## 2013-10-26 DIAGNOSIS — R079 Chest pain, unspecified: Secondary | ICD-10-CM

## 2013-10-26 DIAGNOSIS — I10 Essential (primary) hypertension: Secondary | ICD-10-CM

## 2013-10-26 NOTE — Patient Instructions (Signed)
Your physician recommends that you schedule a follow-up appointment in: 1 year with Dr. Purvis Sheffield. You should receive a letter in the mail in 10 months. If you do not receive this letter by September 2015 call our office to schedule this appointment.   Your physician recommends that you continue on your current medications as directed. Please refer to the Current Medication list given to you today.

## 2013-10-26 NOTE — Progress Notes (Signed)
Patient ID: Heather Cunningham, female   DOB: 1951-01-28, 62 y.o.   MRN: [id number]      SUBJECTIVE: Heather Cunningham has a h/o paroxysmal atrial fibrillation and HTN. She had a prior ischemia workup with a cardiac CEA in 2007 as well as an echo which showed normal LV function and stress echocardiogram in 2009 which was also within normal limits.  She had been having concomitant chest and back pain, which was made worse by holding her arms up when she's cooking or ironing, which I felt was musculoskeletal in etiology. She had C-spine xrays which I had suggested on 08-21-13 which revealed mild degenerative changes and spurring.  When she's in atrial fibrillation, she feels it in her throat. It used to occur once a year, but over the summer, it happened three times. She's been instructed to take an extra Diltiazem tablet or two should it occur. She was recently hospitalized for this and her potassium level was 3.2. She eats tomatoes, bananas, and other potassium-rich foods.   She's been bothered by plantar fasciitis and has been unable to exercise like she would like to.  I checked her K and it was 3.8 and an HbA1C was 5.9%.  She's recently not noticed any atrial fibrillation per se, and believes it's due to the increase in her metoprolol. She occasionally feels "skipped beats". She's not had to use any extra diltiazem tablets.  She denies lightheadedness.   No Known Allergies  Current Outpatient Prescriptions  Medication Sig Dispense Refill  . albuterol (PROAIR HFA) 108 (90 BASE) MCG/ACT inhaler Inhale 2 puffs into the lungs every 6 (six) hours as needed.      Marland Kitchen aspirin 325 MG EC tablet Take 325 mg by mouth daily.        . Calcium Carbonate-Vitamin D (CALTRATE 600+D) 600-400 MG-UNIT per tablet Take 1 tablet by mouth 2 (two) times daily.        Marland Kitchen diltiazem (CARDIZEM) 30 MG tablet Take 1 tablet (30 mg total) by mouth as needed. Take as needed for palpitations.  30 tablet  3  . fish oil-omega-3 fatty acids 1000  MG capsule Take 2 g by mouth 2 (two) times daily.        Marland Kitchen lisinopril-hydrochlorothiazide (PRINZIDE,ZESTORETIC) 20-25 MG per tablet Take 1 tablet by mouth daily.        Marland Kitchen LORazepam (ATIVAN) 1 MG tablet Take 1 mg by mouth as needed.        . metoprolol tartrate (LOPRESSOR) 25 MG tablet Take 50 mg by mouth 2 (two) times daily.       Marland Kitchen omeprazole (PRILOSEC) 40 MG capsule Take 40 mg by mouth daily.        . simvastatin (ZOCOR) 20 MG tablet Take 20 mg by mouth daily.        . Timolol Maleate (ISTALOL) 0.5 % (DAILY) SOLN Apply 1 drop to eye every morning. In both eyes.       Marland Kitchen zolpidem (AMBIEN) 10 MG tablet Take 5 mg by mouth as needed.         No current facility-administered medications for this visit.    Past Medical History  Diagnosis Date  . Hypertension     Unspecified  . Coronary artery disease     Native vessel  . Arrhythmia     Atrial fibrillation  . Asthma   . GERD (gastroesophageal reflux disease)     Past Surgical History  Procedure Laterality Date  . Tubal ligation    .  Maxiofacial    . Eye surgery    . Cataract extraction    . Breast lumpectomy      History   Social History  . Marital Status: Married    Spouse Name: [name]    Number of Children: 1  . Years of Education: N/A   Occupational History  . [employer] Other   Social History Main Topics  . Smoking status: Former Smoker    Types: Cigarettes    Quit date: 12/17/1976  . Smokeless tobacco: Never Used     Comment: QUIT 35 YEARS AGO  . Alcohol Use: No  . Drug Use: No  . Sexual Activity: Not on file   Other Topics Concern  . Not on file   Social History Narrative   Pt gets regular exercise.     Filed Vitals:   10/26/13 0852  BP: 102/60  Pulse: 61  Height: 5\' 4"  (1.626 m)  Weight: 166 lb (75.297 kg)  SpO2: 96%    PHYSICAL EXAM General: NAD Neck: No JVD, no thyromegaly or thyroid nodule.  Lungs: Clear to auscultation bilaterally with normal respiratory effort. CV:  Nondisplaced PMI.  Heart regular S1/S2, no S3/S4, no murmur.  No peripheral edema.  No carotid bruit.  Normal pedal pulses.  Abdomen: Soft, nontender, no hepatosplenomegaly, no distention.  Neurologic: Alert and oriented x 3.  Psych: Normal affect. Extremities: No clubbing or cyanosis.   ECG: reviewed and available in electronic records.      ASSESSMENT AND PLAN: 1. Paroxysmal atrial fibrillation: her 3-week event monitor showed sinus rhythm with PVC's. She is currently asymptomatic. No changes in metoprolol dose at this time. 2. HTN: controlled on current therapy.  3. Chest pain: this appears to be musculoskeletal in etiology, as it is clearly exacerbated by the use of her arms, and is likely due to cervical and thoracic spine arthritis/degenerative joint disease. It has been stable.  Dispo: f/u one year.   Prentice Docker, M.D., F.A.C.C.

## 2013-11-25 ENCOUNTER — Ambulatory Visit: Payer: 59

## 2013-12-25 ENCOUNTER — Ambulatory Visit
Admission: RE | Admit: 2013-12-25 | Discharge: 2013-12-25 | Disposition: A | Discharge status: Home / Self Care | Payer: Self-pay | Source: Ambulatory Visit

## 2013-12-25 DIAGNOSIS — Z1231 Encounter for screening mammogram for malignant neoplasm of breast: Secondary | ICD-10-CM

## 2014-03-23 DIAGNOSIS — H1045 Other chronic allergic conjunctivitis: Secondary | ICD-10-CM | POA: Diagnosis not present

## 2014-06-17 DIAGNOSIS — E782 Mixed hyperlipidemia: Secondary | ICD-10-CM | POA: Diagnosis not present

## 2014-06-17 DIAGNOSIS — I4891 Unspecified atrial fibrillation: Secondary | ICD-10-CM | POA: Diagnosis not present

## 2014-06-17 DIAGNOSIS — I10 Essential (primary) hypertension: Secondary | ICD-10-CM | POA: Diagnosis not present

## 2014-06-17 DIAGNOSIS — F411 Generalized anxiety disorder: Secondary | ICD-10-CM | POA: Diagnosis not present

## 2014-06-17 DIAGNOSIS — Z23 Encounter for immunization: Secondary | ICD-10-CM | POA: Diagnosis not present

## 2014-06-17 DIAGNOSIS — K219 Gastro-esophageal reflux disease without esophagitis: Secondary | ICD-10-CM | POA: Diagnosis not present

## 2014-07-05 DIAGNOSIS — J3489 Other specified disorders of nose and nasal sinuses: Secondary | ICD-10-CM | POA: Diagnosis not present

## 2014-07-22 DIAGNOSIS — J3489 Other specified disorders of nose and nasal sinuses: Secondary | ICD-10-CM | POA: Diagnosis not present

## 2014-10-19 DIAGNOSIS — H43813 Vitreous degeneration, bilateral: Secondary | ICD-10-CM | POA: Diagnosis not present

## 2014-10-19 DIAGNOSIS — H029 Unspecified disorder of eyelid: Secondary | ICD-10-CM | POA: Diagnosis not present

## 2014-10-19 DIAGNOSIS — H02839 Dermatochalasis of unspecified eye, unspecified eyelid: Secondary | ICD-10-CM | POA: Diagnosis not present

## 2014-10-19 DIAGNOSIS — H31003 Unspecified chorioretinal scars, bilateral: Secondary | ICD-10-CM | POA: Diagnosis not present

## 2014-10-19 DIAGNOSIS — H40053 Ocular hypertension, bilateral: Secondary | ICD-10-CM | POA: Diagnosis not present

## 2014-10-19 DIAGNOSIS — H02834 Dermatochalasis of left upper eyelid: Secondary | ICD-10-CM | POA: Diagnosis not present

## 2014-10-19 DIAGNOSIS — H02831 Dermatochalasis of right upper eyelid: Secondary | ICD-10-CM | POA: Diagnosis not present

## 2014-10-19 DIAGNOSIS — Z961 Presence of intraocular lens: Secondary | ICD-10-CM | POA: Diagnosis not present

## 2014-10-19 DIAGNOSIS — H5213 Myopia, bilateral: Secondary | ICD-10-CM | POA: Diagnosis not present

## 2014-10-27 ENCOUNTER — Encounter: Payer: Self-pay | Admitting: Cardiovascular Disease

## 2014-10-27 ENCOUNTER — Ambulatory Visit (INDEPENDENT_AMBULATORY_CARE_PROVIDER_SITE_OTHER): Payer: Medicare Other | Admitting: Cardiovascular Disease

## 2014-10-27 VITALS — HR 59 | Ht 64.0 in | Wt 166.0 lb

## 2014-10-27 DIAGNOSIS — I4891 Unspecified atrial fibrillation: Secondary | ICD-10-CM

## 2014-10-27 DIAGNOSIS — I10 Essential (primary) hypertension: Secondary | ICD-10-CM | POA: Diagnosis not present

## 2014-10-27 DIAGNOSIS — I480 Paroxysmal atrial fibrillation: Secondary | ICD-10-CM

## 2014-10-27 DIAGNOSIS — M47812 Spondylosis without myelopathy or radiculopathy, cervical region: Secondary | ICD-10-CM

## 2014-10-27 DIAGNOSIS — M4692 Unspecified inflammatory spondylopathy, cervical region: Secondary | ICD-10-CM

## 2014-10-27 MED ORDER — METOPROLOL TARTRATE 25 MG PO TABS: 25.0000 mg | ORAL_TABLET | Freq: Two times a day (BID) | ORAL | Status: DC

## 2014-10-27 NOTE — Patient Instructions (Signed)
Your physician recommends that you schedule a follow-up appointment in: 1 year. You will receive a reminder letter in the mail in about 10 months reminding you to call and schedule your appointment. If you don't receive this letter, please contact our office. Your physician has recommended you make the following change in your medication:  Decrease your metoprolol tartrate to 25 mg twice daily. You may break your 50 mg tablet in half twice daily until they are finished. Continue all other medications the same.

## 2014-10-27 NOTE — Progress Notes (Signed)
Patient ID: Heather Cunningham, female   DOB: 05-29-1951, 63 y.o.   MRN: [id number]      SUBJECTIVE: The patient presents for follow-up of paroxysmal atrial fibrillation and hypertension. She has not had any palpitations similar to prior episodes of atrial fibrillation, which occurred in 2009 and 2014. She has degenerative joint disease of the spine, and experiences bilateral arm discomfort when she is cooking and chopping vegetables for long periods of time. She has been doing water aerobics at the [facility] and has noticed increased energy levels. She denies chest pain, shortness of breath, and leg swelling, as well as dizziness and lightheadedness. Her 1 daughter-in-Agustin who lives in [place], [place], was recently diagnosed with infiltrating ductal carcinoma of the breast at the age of 56.   ECG performed in the office today demonstrates sinus bradycardia, heart rate 58 bpm, with a mild nonspecific ST segment and T wave abnormality.  Review of Systems: As per "subjective", otherwise negative.  No Known Allergies  Current Outpatient Prescriptions  Medication Sig Dispense Refill  . albuterol (PROAIR HFA) 108 (90 BASE) MCG/ACT inhaler Inhale 2 puffs into the lungs every 6 (six) hours as needed.    Marland Kitchen aspirin 325 MG EC tablet Take 325 mg by mouth daily.      . Calcium Carbonate-Vitamin D (CALTRATE 600+D) 600-400 MG-UNIT per tablet Take 1 tablet by mouth 2 (two) times daily.      Marland Kitchen diltiazem (CARDIZEM) 30 MG tablet Take 1 tablet (30 mg total) by mouth as needed. Take as needed for palpitations. 30 tablet 3  . lisinopril-hydrochlorothiazide (PRINZIDE,ZESTORETIC) 20-25 MG per tablet Take 1 tablet by mouth daily.      . metoprolol (LOPRESSOR) 50 MG tablet Take 50 mg by mouth 2 (two) times daily.    Marland Kitchen omeprazole (PRILOSEC) 40 MG capsule Take 40 mg by mouth daily.      . simvastatin (ZOCOR) 20 MG tablet Take 20 mg by mouth daily.      . Timolol Maleate (ISTALOL) 0.5 % (DAILY) SOLN Apply 1 drop to eye every  morning. In both eyes.      No current facility-administered medications for this visit.    Past Medical History  Diagnosis Date  . Hypertension     Unspecified  . Coronary artery disease     Native vessel  . Arrhythmia     Atrial fibrillation  . Asthma   . GERD (gastroesophageal reflux disease)     Past Surgical History  Procedure Laterality Date  . Tubal ligation    . [place] surgery    . Cataract extraction    . Breast lumpectomy      History   Social History  . Marital Status: Married    Spouse Name: [name]    Number of Children: 1  . Years of Education: N/A   Occupational History  . [employer] Other   Social History Main Topics  . Smoking status: Former Smoker    Types: Cigarettes    Quit date: 12/17/1976  . Smokeless tobacco: Never Used     Comment: QUIT 35 YEARS AGO  . Alcohol Use: No  . Drug Use: No  . Sexual Activity: Not on file   Other Topics Concern  . Not on file   Social History Narrative   Pt gets regular exercise.     Filed Vitals:   10/27/14 1058  Pulse: 59  Height: 5\' 4"  (1.626 m)  Weight: 166 lb (75.297  kg)    PHYSICAL EXAM General: NAD HEENT: Normal. Neck: No JVD, no thyromegaly. Lungs: Clear to auscultation bilaterally with normal respiratory effort. CV: Nondisplaced PMI.  Regular rate and rhythm, normal S1/S2, no S3/S4, no murmur. No pretibial or periankle edema.  No carotid bruit.  Normal pedal pulses.  Abdomen: Soft, nontender, no hepatosplenomegaly, no distention.  Neurologic: Alert and oriented x 3.  Psych: Normal affect. Skin: Normal. Musculoskeletal: Normal range of motion, no gross deformities. Extremities: No clubbing or cyanosis.   ECG: Most recent ECG reviewed.      ASSESSMENT AND PLAN: 1. Paroxysmal atrial fibrillation: No documented recurrences. Prior 3-week event monitor showed sinus rhythm with PVC's. She remains asymptomatic. As per her request, I will reduce  metoprolol to 25 mg bid and continue diltiazem prn for palpitations. I had a long discussion with her regarding anticoagulation and the annual risk of stroke with paroxysmal atrial fibrillation. She will consider starting therapy. 2. Essential HTN: Well controlled on current therapy. As I am reducing metoprolol, this will need continued monitoring. 3. Cervical spine arthritis: Very mild tenderness to palpation. I encouraged continued water aerobics.  Dispo: f/u 1 year.  Kate Sable, M.D., F.A.C.C.

## 2014-11-23 ENCOUNTER — Other Ambulatory Visit: Payer: Self-pay

## 2014-11-23 DIAGNOSIS — Z1231 Encounter for screening mammogram for malignant neoplasm of breast: Secondary | ICD-10-CM

## 2014-12-20 DIAGNOSIS — Z1389 Encounter for screening for other disorder: Secondary | ICD-10-CM | POA: Diagnosis not present

## 2014-12-20 DIAGNOSIS — I480 Paroxysmal atrial fibrillation: Secondary | ICD-10-CM | POA: Diagnosis not present

## 2014-12-20 DIAGNOSIS — I10 Essential (primary) hypertension: Secondary | ICD-10-CM | POA: Diagnosis not present

## 2014-12-28 ENCOUNTER — Ambulatory Visit
Admission: RE | Admit: 2014-12-28 | Discharge: 2014-12-28 | Disposition: A | Discharge status: Home / Self Care | Payer: Medicare Other | Source: Ambulatory Visit

## 2014-12-28 DIAGNOSIS — Z1231 Encounter for screening mammogram for malignant neoplasm of breast: Secondary | ICD-10-CM

## 2014-12-29 ENCOUNTER — Other Ambulatory Visit: Payer: Self-pay | Admitting: Family Medicine

## 2014-12-29 DIAGNOSIS — R928 Other abnormal and inconclusive findings on diagnostic imaging of breast: Secondary | ICD-10-CM

## 2015-01-07 ENCOUNTER — Other Ambulatory Visit: Payer: Medicare Other

## 2015-01-14 ENCOUNTER — Ambulatory Visit
Admission: RE | Admit: 2015-01-14 | Discharge: 2015-01-14 | Disposition: A | Discharge status: Home / Self Care | Payer: Medicare Other | Source: Ambulatory Visit | Attending: Family Medicine | Admitting: Family Medicine

## 2015-01-14 DIAGNOSIS — N6002 Solitary cyst of left breast: Secondary | ICD-10-CM | POA: Diagnosis not present

## 2015-01-14 DIAGNOSIS — R928 Other abnormal and inconclusive findings on diagnostic imaging of breast: Secondary | ICD-10-CM

## 2015-02-02 DIAGNOSIS — H5213 Myopia, bilateral: Secondary | ICD-10-CM | POA: Diagnosis not present

## 2015-02-02 DIAGNOSIS — Z961 Presence of intraocular lens: Secondary | ICD-10-CM | POA: Diagnosis not present

## 2015-02-02 DIAGNOSIS — H02831 Dermatochalasis of right upper eyelid: Secondary | ICD-10-CM | POA: Diagnosis not present

## 2015-02-02 DIAGNOSIS — H02835 Dermatochalasis of left lower eyelid: Secondary | ICD-10-CM | POA: Diagnosis not present

## 2015-02-02 DIAGNOSIS — H26493 Other secondary cataract, bilateral: Secondary | ICD-10-CM | POA: Diagnosis not present

## 2015-02-02 DIAGNOSIS — H02834 Dermatochalasis of left upper eyelid: Secondary | ICD-10-CM | POA: Diagnosis not present

## 2015-02-02 DIAGNOSIS — H40053 Ocular hypertension, bilateral: Secondary | ICD-10-CM | POA: Diagnosis not present

## 2015-02-02 DIAGNOSIS — H43813 Vitreous degeneration, bilateral: Secondary | ICD-10-CM | POA: Diagnosis not present

## 2015-02-02 DIAGNOSIS — H02832 Dermatochalasis of right lower eyelid: Secondary | ICD-10-CM | POA: Diagnosis not present

## 2015-04-01 DIAGNOSIS — H02834 Dermatochalasis of left upper eyelid: Secondary | ICD-10-CM | POA: Diagnosis not present

## 2015-04-01 DIAGNOSIS — H0289 Other specified disorders of eyelid: Secondary | ICD-10-CM | POA: Diagnosis not present

## 2015-04-01 DIAGNOSIS — Z961 Presence of intraocular lens: Secondary | ICD-10-CM | POA: Diagnosis not present

## 2015-04-01 DIAGNOSIS — H02831 Dermatochalasis of right upper eyelid: Secondary | ICD-10-CM | POA: Diagnosis not present

## 2015-04-01 DIAGNOSIS — H02835 Dermatochalasis of left lower eyelid: Secondary | ICD-10-CM | POA: Diagnosis not present

## 2015-04-01 DIAGNOSIS — H40053 Ocular hypertension, bilateral: Secondary | ICD-10-CM | POA: Diagnosis not present

## 2015-04-01 DIAGNOSIS — H02832 Dermatochalasis of right lower eyelid: Secondary | ICD-10-CM | POA: Diagnosis not present

## 2015-04-14 DIAGNOSIS — M5090 Cervical disc disorder, unspecified, unspecified cervical region: Secondary | ICD-10-CM | POA: Diagnosis not present

## 2015-04-15 ENCOUNTER — Encounter: Payer: Self-pay | Admitting: Physician Assistant

## 2015-04-15 ENCOUNTER — Ambulatory Visit (INDEPENDENT_AMBULATORY_CARE_PROVIDER_SITE_OTHER): Payer: Medicare Other | Admitting: Physician Assistant

## 2015-04-15 VITALS — BP 122/77 | HR 68 | Temp 97.3°F | Ht 64.0 in | Wt 169.0 lb

## 2015-04-15 DIAGNOSIS — L820 Inflamed seborrheic keratosis: Secondary | ICD-10-CM | POA: Diagnosis not present

## 2015-04-15 DIAGNOSIS — L821 Other seborrheic keratosis: Secondary | ICD-10-CM

## 2015-04-15 NOTE — Progress Notes (Signed)
   Subjective:    Patient ID: Heather Cunningham, female    DOB: 1951-03-18, 64 y.o.   MRN: [id number]  HPI 64 y/o female presents with c/o new appearing lesions on face.     Review of Systems  Skin:       Raised lesions on both sides of face and under right eye No bleeding Mild itch        Objective:   Physical Exam  Skin:  Hyperkeratotic lesions on bilateral sides of face and inferior to right eye, resembling seborrheic keratosis          Assessment & Plan:  1. Seborrheic keratoses Areas on face treated with cryosurgery x 5. Benign in appearance. Advised patient to use vaseline for healing and otc hydrocortisone for itch if needed.      A. Benjamin Stain PA-C

## 2015-04-26 DIAGNOSIS — M5410 Radiculopathy, site unspecified: Secondary | ICD-10-CM | POA: Diagnosis not present

## 2015-04-26 DIAGNOSIS — M47812 Spondylosis without myelopathy or radiculopathy, cervical region: Secondary | ICD-10-CM | POA: Diagnosis not present

## 2015-04-26 DIAGNOSIS — M5022 Other cervical disc displacement, mid-cervical region: Secondary | ICD-10-CM | POA: Diagnosis not present

## 2015-04-26 DIAGNOSIS — M542 Cervicalgia: Secondary | ICD-10-CM | POA: Diagnosis not present

## 2015-05-03 DIAGNOSIS — M6281 Muscle weakness (generalized): Secondary | ICD-10-CM | POA: Diagnosis not present

## 2015-05-03 DIAGNOSIS — R202 Paresthesia of skin: Secondary | ICD-10-CM | POA: Diagnosis not present

## 2015-05-03 DIAGNOSIS — R293 Abnormal posture: Secondary | ICD-10-CM | POA: Diagnosis not present

## 2015-05-03 DIAGNOSIS — M542 Cervicalgia: Secondary | ICD-10-CM | POA: Diagnosis not present

## 2015-05-05 DIAGNOSIS — R202 Paresthesia of skin: Secondary | ICD-10-CM | POA: Diagnosis not present

## 2015-05-05 DIAGNOSIS — M6281 Muscle weakness (generalized): Secondary | ICD-10-CM | POA: Diagnosis not present

## 2015-05-05 DIAGNOSIS — M542 Cervicalgia: Secondary | ICD-10-CM | POA: Diagnosis not present

## 2015-05-05 DIAGNOSIS — R293 Abnormal posture: Secondary | ICD-10-CM | POA: Diagnosis not present

## 2015-05-11 DIAGNOSIS — R293 Abnormal posture: Secondary | ICD-10-CM | POA: Diagnosis not present

## 2015-05-11 DIAGNOSIS — R202 Paresthesia of skin: Secondary | ICD-10-CM | POA: Diagnosis not present

## 2015-05-11 DIAGNOSIS — M542 Cervicalgia: Secondary | ICD-10-CM | POA: Diagnosis not present

## 2015-05-11 DIAGNOSIS — M6281 Muscle weakness (generalized): Secondary | ICD-10-CM | POA: Diagnosis not present

## 2015-05-17 DIAGNOSIS — M6281 Muscle weakness (generalized): Secondary | ICD-10-CM | POA: Diagnosis not present

## 2015-05-17 DIAGNOSIS — R202 Paresthesia of skin: Secondary | ICD-10-CM | POA: Diagnosis not present

## 2015-05-17 DIAGNOSIS — M542 Cervicalgia: Secondary | ICD-10-CM | POA: Diagnosis not present

## 2015-05-17 DIAGNOSIS — R293 Abnormal posture: Secondary | ICD-10-CM | POA: Diagnosis not present

## 2015-05-19 DIAGNOSIS — R202 Paresthesia of skin: Secondary | ICD-10-CM | POA: Diagnosis not present

## 2015-05-19 DIAGNOSIS — M542 Cervicalgia: Secondary | ICD-10-CM | POA: Diagnosis not present

## 2015-05-19 DIAGNOSIS — M6281 Muscle weakness (generalized): Secondary | ICD-10-CM | POA: Diagnosis not present

## 2015-05-19 DIAGNOSIS — R293 Abnormal posture: Secondary | ICD-10-CM | POA: Diagnosis not present

## 2015-05-26 DIAGNOSIS — D485 Neoplasm of uncertain behavior of skin: Secondary | ICD-10-CM | POA: Diagnosis not present

## 2015-05-26 DIAGNOSIS — C44319 Basal cell carcinoma of skin of other parts of face: Secondary | ICD-10-CM | POA: Diagnosis not present

## 2015-05-26 DIAGNOSIS — L821 Other seborrheic keratosis: Secondary | ICD-10-CM | POA: Diagnosis not present

## 2015-05-26 DIAGNOSIS — L570 Actinic keratosis: Secondary | ICD-10-CM | POA: Diagnosis not present

## 2015-05-26 DIAGNOSIS — L820 Inflamed seborrheic keratosis: Secondary | ICD-10-CM | POA: Diagnosis not present

## 2015-05-27 DIAGNOSIS — I10 Essential (primary) hypertension: Secondary | ICD-10-CM | POA: Diagnosis not present

## 2015-05-27 DIAGNOSIS — I480 Paroxysmal atrial fibrillation: Secondary | ICD-10-CM | POA: Diagnosis not present

## 2015-05-27 DIAGNOSIS — M5090 Cervical disc disorder, unspecified, unspecified cervical region: Secondary | ICD-10-CM | POA: Diagnosis not present

## 2015-06-22 ENCOUNTER — Encounter: Payer: Medicare Other | Admitting: Neurology

## 2015-06-22 ENCOUNTER — Encounter: Payer: Self-pay | Admitting: Neurology

## 2015-06-22 ENCOUNTER — Ambulatory Visit (INDEPENDENT_AMBULATORY_CARE_PROVIDER_SITE_OTHER): Payer: Medicare Other | Admitting: Neurology

## 2015-06-22 ENCOUNTER — Ambulatory Visit (INDEPENDENT_AMBULATORY_CARE_PROVIDER_SITE_OTHER): Payer: Self-pay | Admitting: Neurology

## 2015-06-22 DIAGNOSIS — G5601 Carpal tunnel syndrome, right upper limb: Secondary | ICD-10-CM

## 2015-06-22 DIAGNOSIS — G5600 Carpal tunnel syndrome, unspecified upper limb: Secondary | ICD-10-CM | POA: Insufficient documentation

## 2015-06-22 DIAGNOSIS — Z0289 Encounter for other administrative examinations: Secondary | ICD-10-CM

## 2015-06-22 DIAGNOSIS — G5602 Carpal tunnel syndrome, left upper limb: Secondary | ICD-10-CM | POA: Diagnosis not present

## 2015-06-22 DIAGNOSIS — M5412 Radiculopathy, cervical region: Secondary | ICD-10-CM

## 2015-06-22 DIAGNOSIS — G5603 Carpal tunnel syndrome, bilateral upper limbs: Secondary | ICD-10-CM

## 2015-06-22 NOTE — Progress Notes (Signed)
   [name] 04/26/1951  Date:  June 22, 2015 Referred by:  [name]  HISTORY:  Heather Cunningham is a 64 year old woman who reports greater than left hand pain and numbness and right neck and arm pain. Pain in the hands often awakens her at night, especially on the right. The neck pain is mild most of the time but when it becomes worse she will have pain that radiates down the right arm. She denies any significant weakness.  NERVE CONDUCTION STUDIES:  The median motor responses were slowed across the wrists, worse on the right and amplitudes were mildly reduced on both sides.  The median sensory responses were slowed across both wrists, worse on the right. The right median sensory amplitudes were reduced.  Ulnar motor and sensory responses were normal on both sides.  Though the distal latency of the motor response on the left was more than 1 ms slower than the right.  EMG STUDIES:  Needle EMG of selected muscles of the right arm and the left hand were performed. The right deltoid, triceps, ADM and APB were normal. The right triceps and EDC muscles showed some polyphasic motor units recruited with a reduced interference pattern.  Needle EMG of the left APB and ADM hand muscles was normal.   IMPRESSION:  This is an abnormal EMG/NCV study showing the following: 1.  Bilateral moderate median neuropathies at the wrist (carpal tunnel syndromes) worse on the right. 2.  Mild chronic right C7 radiculopathy without active features.    A. Felecia Shelling, MD, PhD Certified in Neurology, [place] [facility], Sleep Medicine, Pain Medicine and Neuroimaging  [facility] [address] [place], [place] [address] [phone]

## 2015-06-23 DIAGNOSIS — C44310 Basal cell carcinoma of skin of unspecified parts of face: Secondary | ICD-10-CM | POA: Diagnosis not present

## 2015-07-01 DIAGNOSIS — E782 Mixed hyperlipidemia: Secondary | ICD-10-CM | POA: Diagnosis not present

## 2015-07-01 DIAGNOSIS — I480 Paroxysmal atrial fibrillation: Secondary | ICD-10-CM | POA: Diagnosis not present

## 2015-07-01 DIAGNOSIS — K219 Gastro-esophageal reflux disease without esophagitis: Secondary | ICD-10-CM | POA: Diagnosis not present

## 2015-07-01 DIAGNOSIS — I10 Essential (primary) hypertension: Secondary | ICD-10-CM | POA: Diagnosis not present

## 2015-07-06 DIAGNOSIS — G5601 Carpal tunnel syndrome, right upper limb: Secondary | ICD-10-CM | POA: Diagnosis not present

## 2015-07-06 DIAGNOSIS — G5602 Carpal tunnel syndrome, left upper limb: Secondary | ICD-10-CM | POA: Diagnosis not present

## 2015-07-06 DIAGNOSIS — M5412 Radiculopathy, cervical region: Secondary | ICD-10-CM | POA: Diagnosis not present

## 2015-07-08 DIAGNOSIS — I10 Essential (primary) hypertension: Secondary | ICD-10-CM | POA: Diagnosis not present

## 2015-07-08 DIAGNOSIS — M5090 Cervical disc disorder, unspecified, unspecified cervical region: Secondary | ICD-10-CM | POA: Diagnosis not present

## 2015-07-08 DIAGNOSIS — E782 Mixed hyperlipidemia: Secondary | ICD-10-CM | POA: Diagnosis not present

## 2015-07-08 DIAGNOSIS — K219 Gastro-esophageal reflux disease without esophagitis: Secondary | ICD-10-CM | POA: Diagnosis not present

## 2015-07-08 DIAGNOSIS — G5600 Carpal tunnel syndrome, unspecified upper limb: Secondary | ICD-10-CM | POA: Diagnosis not present

## 2015-07-08 DIAGNOSIS — Z0000 Encounter for general adult medical examination without abnormal findings: Secondary | ICD-10-CM | POA: Diagnosis not present

## 2015-07-08 DIAGNOSIS — I480 Paroxysmal atrial fibrillation: Secondary | ICD-10-CM | POA: Diagnosis not present

## 2015-07-11 ENCOUNTER — Encounter: Payer: Self-pay | Admitting: Internal Medicine

## 2015-07-15 DIAGNOSIS — Z9889 Other specified postprocedural states: Secondary | ICD-10-CM | POA: Diagnosis not present

## 2015-07-15 DIAGNOSIS — H40053 Ocular hypertension, bilateral: Secondary | ICD-10-CM | POA: Diagnosis not present

## 2015-07-15 DIAGNOSIS — H527 Unspecified disorder of refraction: Secondary | ICD-10-CM | POA: Diagnosis not present

## 2015-07-15 DIAGNOSIS — Z9841 Cataract extraction status, right eye: Secondary | ICD-10-CM | POA: Diagnosis not present

## 2015-07-15 DIAGNOSIS — H02831 Dermatochalasis of right upper eyelid: Secondary | ICD-10-CM | POA: Diagnosis not present

## 2015-07-15 DIAGNOSIS — Z79899 Other long term (current) drug therapy: Secondary | ICD-10-CM | POA: Diagnosis not present

## 2015-07-15 DIAGNOSIS — Z9842 Cataract extraction status, left eye: Secondary | ICD-10-CM | POA: Diagnosis not present

## 2015-07-15 DIAGNOSIS — I10 Essential (primary) hypertension: Secondary | ICD-10-CM | POA: Diagnosis not present

## 2015-07-15 DIAGNOSIS — Z961 Presence of intraocular lens: Secondary | ICD-10-CM | POA: Diagnosis not present

## 2015-07-15 DIAGNOSIS — Z7982 Long term (current) use of aspirin: Secondary | ICD-10-CM | POA: Diagnosis not present

## 2015-07-15 DIAGNOSIS — H02832 Dermatochalasis of right lower eyelid: Secondary | ICD-10-CM | POA: Diagnosis not present

## 2015-07-15 DIAGNOSIS — Z87891 Personal history of nicotine dependence: Secondary | ICD-10-CM | POA: Diagnosis not present

## 2015-07-15 DIAGNOSIS — H02839 Dermatochalasis of unspecified eye, unspecified eyelid: Secondary | ICD-10-CM | POA: Diagnosis not present

## 2015-07-15 DIAGNOSIS — H029 Unspecified disorder of eyelid: Secondary | ICD-10-CM | POA: Diagnosis not present

## 2015-07-15 DIAGNOSIS — H43813 Vitreous degeneration, bilateral: Secondary | ICD-10-CM | POA: Diagnosis not present

## 2015-07-15 DIAGNOSIS — H5213 Myopia, bilateral: Secondary | ICD-10-CM | POA: Diagnosis not present

## 2015-07-15 DIAGNOSIS — H31003 Unspecified chorioretinal scars, bilateral: Secondary | ICD-10-CM | POA: Diagnosis not present

## 2015-08-04 ENCOUNTER — Ambulatory Visit: Payer: Medicare Other | Admitting: Nurse Practitioner

## 2015-08-18 ENCOUNTER — Other Ambulatory Visit: Payer: Self-pay

## 2015-08-18 ENCOUNTER — Ambulatory Visit (INDEPENDENT_AMBULATORY_CARE_PROVIDER_SITE_OTHER): Payer: Medicare Other | Admitting: Gastroenterology

## 2015-08-18 ENCOUNTER — Encounter: Payer: Self-pay | Admitting: Gastroenterology

## 2015-08-18 VITALS — BP 117/78 | HR 62 | Temp 97.0°F | Ht 65.0 in | Wt 170.8 lb

## 2015-08-18 DIAGNOSIS — K625 Hemorrhage of anus and rectum: Secondary | ICD-10-CM

## 2015-08-18 DIAGNOSIS — K648 Other hemorrhoids: Secondary | ICD-10-CM | POA: Insufficient documentation

## 2015-08-18 MED ORDER — SOD PICOSULFATE-MAG OX-CIT ACD 10-3.5-12 MG-GM-GM PO PACK
1.0000 | PACK | ORAL | Status: DC
Start: 2015-08-18 — End: 2015-09-05

## 2015-08-18 NOTE — Progress Notes (Signed)
CC'ED TO PCP 

## 2015-08-18 NOTE — Patient Instructions (Addendum)
FULL LIQUID DIET SEP 18. SEE  INFO BELOW.  COLONOSCOPY SEP 19.  HOLD PRINIZIDE SEP 19.  YOUR FOLLOW UP WILL BE SCHEDULED AFTER COLONOSCOPY.   HEMORRHOIDAL BANDING COMPLICATIONS:  COMMON: 1. MINOR PAIN  UNCOMMON: 1. ABSCESS 2. BAND FALLS OFF 3. PROLAPSE OF HEMORRHOIDS AND PAIN 4. ULCER BLEEDING  A. USUALLY SELF-LIMITED: MAY LAST 3-5 DAYS  B. MAY REQUIRE INTERVENTION: 1-2 WEEKS AFTER INTERACTIONS 5. NECROTIZING PELVIC SEPSIS  A. SYMPTOMS: FEVER, PAIN, DIFFICULTY URINATING    Full Liquid Diet A high-calorie, high-protein supplement should be used to meet your nutritional requirements when the full liquid diet is continued for more than 2 or 3 days. If this diet is to be used for an extended period of time (more than 7 days), a multivitamin should be considered.  Breads and Starches  Allowed: None are allowed except crackers WHOLE OR pureed (made into a thick, smooth soup) in soup.   Avoid: Any others.    Potatoes/Pasta/Rice  Allowed: ANY ITEM AS A SOUP OR SMALL PLATE OF MASHED POTATOES.       Vegetables  Allowed: Strained tomato or vegetable juice. Vegetables pureed in soup.   Avoid: Any others.    Fruit  Allowed: Any strained fruit juices and fruit drinks. Include 1 serving of citrus or vitamin C-enriched fruit juice daily.   Avoid: Any others.  Meat and Meat Substitutes  Allowed: Egg  Avoid: Any meat, fish, or fowl. All cheese.  Milk  Allowed: Milk beverages, including milk shakes and instant breakfast mixes. Smooth yogurt.   Avoid: Any others. Avoid dairy products if not tolerated.    Soups and Combination Foods  Allowed: Broth, strained cream soups. Strained, broth-based soups.   Avoid: Any others.    Desserts and Sweets  Allowed: flavored gelatin,plain ice cream, sherbet, smooth pudding, junket, fruit ices, frozen ice pops, pudding pops,, frozen fudge pops, chocolate syrup. Sugar, honey, jelly, syrup.   Avoid: Any others.  Fats and  Oils  Allowed: Margarine, butter, cream, sour cream, oils.   Avoid: Any others.  Beverages  Allowed: All.   Avoid: None.  Condiments  Allowed: Iodized salt, pepper, spices, flavorings. Cocoa powder.   Avoid: Any others.    SAMPLE MEAL PLAN Breakfast   cup orange juice.   1 OR 2 EGGS   1 cup  milk.   1 cup beverage (coffee or tea).   Cream or sugar, if desired.    Midmorning Snack  2 SCRAMBLED OR HARD BOILED EGG   Lunch  1 cup cream soup.    cup fruit juice.   1 cup milk.    cup custard.   1 cup beverage (coffee or tea).   Cream or sugar, if desired.    Midafternoon Snack  1 cup milk shake.  Dinner  1 cup cream soup.    cup fruit juice.   1 cup milk.    cup pudding.   1 cup beverage (coffee or tea).   Cream or sugar, if desired.  Evening Snack  1 cup supplement.  To increase calories, add sugar, cream, butter, or margarine if possible. Nutritional supplements will also increase the total calories.

## 2015-08-18 NOTE — Progress Notes (Signed)
Subjective:    Patient ID: Heather Cunningham, female    DOB: 1951/02/22, 64 y.o.   MRN: [id number] [name], MD  HPI HAS HAD TCS-BLEEDING BY DR. Braulio Bosch THEN TCS: SCREENING DR. Anthony Sar (~5 YEARS AGO). NEVER HAD POLYPS. MOTHER HAD COLON CANCER AFTER AGE 59 AND GRANDMOTHER AFTER AGE 24. BLEEDING ONCE A MO ASSOCIATED WITH CONSTIPATION. ON TISSUE AND WHEN SHE WIPES. NO RECTAL PRESSURE, OR PAIN. HAS RECTAL ITCHING, BURNING, AND SOILING WHEN SHE HAS A FLARE. BMs: USU. DAILY.   FOR PAST FEW YEARS, CONSTIPATION ONCE OR TWICE A MO AND MAY BE WORSE WITH AGE. RARE EPISODE OF WATERY STOOLS(ONCE A MO.) RARE GAS RUMBLING AROUND WHEN SHE HAS DIARRHEA.  Problems with sedation-TROUBLE WAKING UP. IF EATS SPICY FOOD SHE HAS HEARTBURN, OTHERWISE THE PRILOSEC WORKS.  PT DENIES FEVER, CHILLS, nausea, vomiting, melena, CHEST PAIN, SHORTNESS OF BREATH, problems swallowing,OR heartburn or indigestion.   Past Medical History  Diagnosis Date  . Hypertension     Unspecified  . Coronary artery disease     Native vessel  . Arrhythmia     Atrial fibrillation  . Asthma   . GERD (gastroesophageal reflux disease)    Past Surgical History  Procedure Laterality Date  . Tubal ligation    . [place] surgery    . Cataract extraction    . Breast lumpectomy     No Known Allergies  Current Outpatient Prescriptions  Medication Sig Dispense Refill  . albuterol (PROAIR HFA) 108 (90 BASE) MCG/ACT inhaler Inhale 2 puffs into the lungs every 6 (six) hours as needed.    Marland Kitchen aspirin 325 MG EC tablet Take 325 mg by mouth daily.      . Calcium Carbonate-Vitamin D (CALTRATE 600+D) 600-400 MG-UNIT per tablet Take 1 tablet by mouth 2 (two) times daily.      Marland Kitchen diltiazem (CARDIZEM) 30 MG tablet Take 1 tablet (30 mg total) by mouth as needed. Take as needed for palpitations. WITH ACUTE EPISODES   . latanoprost (XALATAN) 0.005 % ophthalmic solution Place 1 drop into both eyes at bedtime.    Marland Kitchen lisinopril-hydrochlorothiazide  (PRINZIDE,ZESTORETIC) 20-25 MG per tablet Take 1 tablet by mouth daily.      . metoprolol tartrate (LOPRESSOR) 25 MG tablet Take 1 tablet (25 mg total) by mouth 2 (two) times daily.    Marland Kitchen omeprazole (PRILOSEC) 40 MG capsule Take 40 mg by mouth daily.      . simvastatin (ZOCOR) 20 MG tablet Take 20 mg by mouth daily.       Family History  Problem Relation Age of Onset  . Cancer Other   . Heart failure Other   . Colon cancer Mother   . Colon cancer Maternal Grandmother   . Colon polyps Neg Hx     Social History   Social History  . Marital Status: Married    Spouse Name: [name]  . Number of Children: 1  . Years of Education: N/A   Occupational History  . [employer] Other   Social History Main Topics  . Smoking status: Former Smoker    Types: Cigarettes    Quit date: 12/17/1976  . Smokeless tobacco: Never Used     Comment: QUIT 35 YEARS AGO  . Alcohol Use: No  . Drug Use: No  . Sexual Activity: Not Asked   Other Topics Concern  . None   Social History Narrative   Pt gets regular exercise. 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 WORKED FOR [employer] [place]. KIDS: [place], [place] WAS IN THE [employer].   Review of Systems PER HPI OTHERWISE ALL SYSTEMS ARE NEGATIVE.     Objective:   Physical Exam  Constitutional: She is oriented to person, place, and time. She appears well-developed and well-nourished. No distress.  HENT:  Head: Normocephalic and atraumatic.  Mouth/Throat: Oropharynx is clear and moist. No oropharyngeal exudate.  Eyes: Pupils are equal, round, and reactive to light. No scleral icterus.  Neck: Normal range of motion. Neck supple.  Cardiovascular: Normal rate, regular rhythm and normal heart sounds.   Pulmonary/Chest: Effort normal and breath sounds normal. No respiratory distress.  Abdominal: Soft. Bowel sounds are normal. She exhibits no distension. There is no tenderness.  Musculoskeletal: She exhibits no edema.    Lymphadenopathy:    She has no cervical adenopathy.  Neurological: She is alert and oriented to person, place, and time.  NO FOCAL DEFICITS   Psychiatric: She has a normal mood and affect.  Vitals reviewed.         Assessment & Plan:

## 2015-08-18 NOTE — Assessment & Plan Note (Addendum)
CHRONIC RECTAL BLEEDING SYMPTOMS NOT CONTROLLED.-DIFFERENTIAL DIAGNOSIS INCLUDES HEMORRHOIDS, COLON POLYPS, AVMs, & LESS LIKELY COLON CA.   FULL LIQUID DIET SEP 18.  HANDOUT GIVEN. COLONOSCOPY/IH BANDING SEP 19. DISCUSSED PROCEDURE, BENEFITS, & RISKS: < 1% chance of medication reaction, bleeding, perforation, PELVIC VEIN SEPSIS, or rupture of spleen/liver. HOLD PRINIZIDE SEP 19. FOLLOW UP WILL BE SCHEDULED AFTER COLONOSCOPY.   GREATER THAN 50% WAS SPENT IN COUNSELING & COORDINATION OF CARE WITH THE PATIENT: DISCUSSED DIFFERENTIAL DIAGNOSIS, PROCEDURE, BENEFITS, RISKS, AND MANAGEMENT OF RECTAL BLEEDING/HEMORRHOIDS. TOTAL ENCOUNTER TIME: 56 MINS.

## 2015-09-02 ENCOUNTER — Telehealth: Payer: Self-pay | Admitting: *Deleted

## 2015-09-02 NOTE — Telephone Encounter (Signed)
Pt agreeable to contact primary doctor and call us back if symptoms change

## 2015-09-02 NOTE — Telephone Encounter (Signed)
Pt says HR 58 this AM c/o weakness/fatigue "just don't feel right" for 2 nights. Pt is having colonoscopy and going to band hemorrhoid. Pt is concerned about up coming procedure with the way she feel and requesting EKG. Dr. Bronson Ing is out of office, will forward to Dr. Harl Bowie

## 2015-09-02 NOTE — Telephone Encounter (Signed)
Reviewed her chart, heart rate is stable since her last visit with Dr Raliegh Ip. A heart rate of 58 would not be significant enough to cause significant symptoms. I would suggest she start with an evaluation by her pcp for her fatigue, if felt to be heart related we could have her come in to be seen by [place]   Zandra Abts MD

## 2015-09-05 ENCOUNTER — Ambulatory Visit (HOSPITAL_COMMUNITY)
Admission: RE | Admit: 2015-09-05 | Discharge: 2015-09-05 | Disposition: A | Discharge status: Home / Self Care | Payer: Medicare Other | Source: Ambulatory Visit | Attending: Gastroenterology | Admitting: Gastroenterology

## 2015-09-05 ENCOUNTER — Encounter (HOSPITAL_COMMUNITY): Payer: Self-pay | Admitting: *Deleted

## 2015-09-05 ENCOUNTER — Encounter (HOSPITAL_COMMUNITY)
Admission: RE | Disposition: A | Discharge status: Home / Self Care | Payer: Self-pay | Source: Ambulatory Visit | Attending: Gastroenterology

## 2015-09-05 ENCOUNTER — Telehealth: Payer: Self-pay

## 2015-09-05 DIAGNOSIS — K648 Other hemorrhoids: Secondary | ICD-10-CM | POA: Insufficient documentation

## 2015-09-05 DIAGNOSIS — Z7982 Long term (current) use of aspirin: Secondary | ICD-10-CM | POA: Diagnosis not present

## 2015-09-05 DIAGNOSIS — K649 Unspecified hemorrhoids: Secondary | ICD-10-CM | POA: Diagnosis not present

## 2015-09-05 DIAGNOSIS — K5900 Constipation, unspecified: Secondary | ICD-10-CM | POA: Insufficient documentation

## 2015-09-05 DIAGNOSIS — Q438 Other specified congenital malformations of intestine: Secondary | ICD-10-CM | POA: Insufficient documentation

## 2015-09-05 DIAGNOSIS — K219 Gastro-esophageal reflux disease without esophagitis: Secondary | ICD-10-CM | POA: Insufficient documentation

## 2015-09-05 DIAGNOSIS — Z800 Family history of malignant neoplasm of digestive organs: Secondary | ICD-10-CM | POA: Insufficient documentation

## 2015-09-05 DIAGNOSIS — I2510 Atherosclerotic heart disease of native coronary artery without angina pectoris: Secondary | ICD-10-CM | POA: Insufficient documentation

## 2015-09-05 DIAGNOSIS — K921 Melena: Secondary | ICD-10-CM | POA: Insufficient documentation

## 2015-09-05 DIAGNOSIS — Z87891 Personal history of nicotine dependence: Secondary | ICD-10-CM | POA: Insufficient documentation

## 2015-09-05 DIAGNOSIS — I10 Essential (primary) hypertension: Secondary | ICD-10-CM | POA: Diagnosis not present

## 2015-09-05 DIAGNOSIS — Z538 Procedure and treatment not carried out for other reasons: Secondary | ICD-10-CM | POA: Diagnosis not present

## 2015-09-05 DIAGNOSIS — K625 Hemorrhage of anus and rectum: Secondary | ICD-10-CM | POA: Diagnosis not present

## 2015-09-05 DIAGNOSIS — K644 Residual hemorrhoidal skin tags: Secondary | ICD-10-CM | POA: Diagnosis not present

## 2015-09-05 HISTORY — PX: COLONOSCOPY: SHX5424

## 2015-09-05 SURGERY — COLONOSCOPY
Anesthesia: Moderate Sedation

## 2015-09-05 MED ORDER — MIDAZOLAM HCL 5 MG/5ML IJ SOLN
INTRAMUSCULAR | Status: DC | PRN
  Administered 2015-09-05: 1 mg via INTRAVENOUS
  Administered 2015-09-05 (×3): 2 mg via INTRAVENOUS

## 2015-09-05 MED ORDER — PROMETHAZINE HCL 25 MG/ML IJ SOLN
INTRAMUSCULAR | Status: AC
  Filled 2015-09-05: qty 1

## 2015-09-05 MED ORDER — MEPERIDINE HCL 100 MG/ML IJ SOLN
INTRAMUSCULAR | Status: DC | PRN
  Administered 2015-09-05 (×2): 25 mg via INTRAVENOUS

## 2015-09-05 MED ORDER — MEPERIDINE HCL 100 MG/ML IJ SOLN
INTRAMUSCULAR | Status: DC
Start: 2015-09-05 — End: 2015-09-05
  Filled 2015-09-05: qty 2

## 2015-09-05 MED ORDER — SODIUM CHLORIDE 0.9 % IJ SOLN
INTRAMUSCULAR | Status: AC
  Filled 2015-09-05: qty 3

## 2015-09-05 MED ORDER — MIDAZOLAM HCL 5 MG/5ML IJ SOLN
INTRAMUSCULAR | Status: AC
  Filled 2015-09-05: qty 10

## 2015-09-05 MED ORDER — PROMETHAZINE HCL 25 MG/ML IJ SOLN
INTRAMUSCULAR | Status: DC | PRN
  Administered 2015-09-05: 12.5 mg via INTRAVENOUS

## 2015-09-05 MED ORDER — SIMETHICONE 40 MG/0.6ML PO SUSP
ORAL | Status: DC | PRN
Start: 2015-09-05 — End: 2015-09-05
  Administered 2015-09-05: 12:00:00

## 2015-09-05 MED ORDER — SODIUM CHLORIDE 0.9 % IV SOLN
INTRAVENOUS | Status: DC
  Administered 2015-09-05: 10:00:00 via INTRAVENOUS

## 2015-09-05 NOTE — Telephone Encounter (Signed)
-----   Message from [name], MD sent at 09/05/2015 12:45 PM EDT ----- Next colonoscopy WITHIN THE NEXT MONTH with MAC AND PEDS SCOPE, Dx: RECTAL BLEEDING. PT FAILED CONSCIOUS SEDATION.

## 2015-09-05 NOTE — Discharge Instructions (Signed)
Your rectal bleeding is most likely due to internal hemorrhoids. THEY ARE NOT BIG ENOUGH TO BAND.  I COULD NOT COMPLETE YOUR COLONOSCOPY. YOUR BLOOD PRESSURE WAS LOW AND I WAS UNABLE TO GIVE YOU ANY MORE SEDATION TO MAKE YOU COMFORTABLE. YOU DID NOT HAVE ANY POLYPS.   FOLLOW A HIGH FIBER DIET. AVOID ITEMS THAT CAUSE BLOATING. SEE INFO BELOW.  USE PREPARATION H CREAM OR SUPPOSITORIES AS NEEDED FOR RECTAL BLEEDING OR PAIN. CALL FOR PRESCRIPTION CREAM/SUPPOSITORIES IF YOU NEED THEM.  Next colonoscopy WITHIN THE NEXT MONTH.        Colonoscopy Care After Read the instructions outlined below and refer to this sheet in the next week. These discharge instructions provide you with general information on caring for yourself after you leave the hospital. While your treatment has been planned according to the most current medical practices available, unavoidable complications occasionally occur. If you have any problems or questions after discharge, call DR. , [phone].  ACTIVITY  You may resume your regular activity, but move at a slower pace for the next 24 hours.   Take frequent rest periods for the next 24 hours.   Walking will help get rid of the air and reduce the bloated feeling in your belly (abdomen).   No driving for 24 hours (because of the medicine (anesthesia) used during the test).   You may shower.   Do not sign any important legal documents or operate any machinery for 24 hours (because of the anesthesia used during the test).    NUTRITION  Drink plenty of fluids.   You may resume your normal diet as instructed by your doctor.   Begin with a light meal and progress to your normal diet. Heavy or fried foods are harder to digest and may make you feel sick to your stomach (nauseated).   Avoid alcoholic beverages for 24 hours or as instructed.    MEDICATIONS  You may resume your normal medications.   WHAT YOU CAN EXPECT TODAY  Some feelings of bloating  in the abdomen.   Passage of more gas than usual.   Spotting of blood in your stool or on the toilet paper  .  IF YOU HAD POLYPS REMOVED DURING THE COLONOSCOPY:  Eat a soft diet IF YOU HAVE NAUSEA, BLOATING, ABDOMINAL PAIN, OR VOMITING.    FINDING OUT THE RESULTS OF YOUR TEST Not all test results are available during your visit. DR. Oneida Alar WILL CALL YOU WITHIN 7 DAYS OF YOUR PROCEDUE WITH YOUR RESULTS. Do not assume everything is normal if you have not heard from DR.  IN ONE WEEK, CALL HER OFFICE AT [phone].  SEEK IMMEDIATE MEDICAL ATTENTION AND CALL THE OFFICE: [phone] IF:  You have more than a spotting of blood in your stool.   Your belly is swollen (abdominal distention).   You are nauseated or vomiting.   You have a temperature over 101F.   You have abdominal pain or discomfort that is severe or gets worse throughout the day.  High-Fiber Diet A high-fiber diet changes your normal diet to include more whole grains, legumes, fruits, and vegetables. Changes in the diet involve replacing refined carbohydrates with unrefined foods. The calorie level of the diet is essentially unchanged. The Dietary Reference Intake (recommended amount) for adult males is 38 grams per day. For adult females, it is 25 grams per day. Pregnant and lactating women should consume 28 grams of fiber per day. Fiber is the intact part of a plant that is not  broken down during digestion. Functional fiber is fiber that has been isolated from the plant to provide a beneficial effect in the body. PURPOSE  Increase stool bulk.   Ease and regulate bowel movements.   Lower cholesterol.  REDUCE RISK OF COLON CANCER  INDICATIONS THAT YOU NEED MORE FIBER  Constipation and hemorrhoids.   Uncomplicated diverticulosis (intestine condition) and irritable bowel syndrome.   Weight management.   As a protective measure against hardening of the arteries (atherosclerosis), diabetes, and cancer.    GUIDELINES FOR INCREASING FIBER IN THE DIET  Start adding fiber to the diet slowly. A gradual increase of about 5 more grams (2 slices of whole-wheat bread, 2 servings of most fruits or vegetables, or 1 bowl of high-fiber cereal) per day is best. Too rapid an increase in fiber may result in constipation, flatulence, and bloating.   Drink enough water and fluids to keep your urine clear or pale yellow. Water, juice, or caffeine-free drinks are recommended. Not drinking enough fluid may cause constipation.   Eat a variety of high-fiber foods rather than one type of fiber.   Try to increase your intake of fiber through using high-fiber foods rather than fiber pills or supplements that contain small amounts of fiber.   The goal is to change the types of food eaten. Do not supplement your present diet with high-fiber foods, but replace foods in your present diet.   INCLUDE A VARIETY OF FIBER SOURCES  Replace refined and processed grains with whole grains, canned fruits with fresh fruits, and incorporate other fiber sources. White rice, white breads, and most bakery goods contain little or no fiber.   Brown whole-grain rice, buckwheat oats, and many fruits and vegetables are all good sources of fiber. These include: broccoli, Brussels sprouts, cabbage, cauliflower, beets, sweet potatoes, white potatoes (skin on), carrots, tomatoes, eggplant, squash, berries, fresh fruits, and dried fruits.   Cereals appear to be the richest source of fiber. Cereal fiber is found in whole grains and bran. Bran is the fiber-rich outer coat of cereal grain, which is largely removed in refining. In whole-grain cereals, the bran remains. In breakfast cereals, the largest amount of fiber is found in those with "bran" in their names. The fiber content is sometimes indicated on the label.   You may need to include additional fruits and vegetables each day.   In baking, for 1 cup white flour, you may use the following  substitutions:   1 cup whole-wheat flour minus 2 tablespoons.   1/2 cup white flour plus 1/2 cup whole-wheat flour.   Hemorrhoids Hemorrhoids are dilated (enlarged) veins around the rectum. Sometimes clots will form in the veins. This makes them swollen and painful. These are called thrombosed hemorrhoids. Causes of hemorrhoids include:  Constipation.   Straining to have a bowel movement.   HEAVY LIFTING HOME CARE INSTRUCTIONS  Eat a well balanced diet and drink 6 to 8 glasses of water every day to avoid constipation. You may also use a bulk laxative.   Avoid straining to have bowel movements.   Keep anal area dry and clean.   Do not use a donut shaped pillow or sit on the toilet for long periods. This increases blood pooling and pain.   Move your bowels when your body has the urge; this will require less straining and will decrease pain and pressure.

## 2015-09-05 NOTE — Telephone Encounter (Signed)
Called and spoke with a family member and he states that the patient is asleep and that it would be better to contact the patient tomorrow about scheduling.

## 2015-09-05 NOTE — H&P (View-Only) (Signed)
Subjective:    Patient ID: Heather Cunningham, female    DOB: 08/19/1951, 64 y.o.   MRN: [id number] Heather Percy, MD  HPI HAS HAD TCS-BLEEDING BY Heather Cunningham THEN TCS: SCREENING Heather Cunningham (~5 YEARS AGO). NEVER HAD POLYPS. MOTHER HAD COLON CANCER AFTER AGE 89 AND GRANDMOTHER AFTER AGE 27. BLEEDING ONCE A MO ASSOCIATED WITH CONSTIPATION. ON TISSUE AND WHEN SHE WIPES. NO RECTAL PRESSURE, OR PAIN. HAS RECTAL ITCHING, BURNING, AND SOILING WHEN SHE HAS A FLARE. BMs: USU. DAILY.   FOR PAST FEW YEARS, CONSTIPATION ONCE OR TWICE A MO AND MAY BE WORSE WITH AGE. RARE EPISODE OF WATERY STOOLS(ONCE A MO.) RARE GAS RUMBLING AROUND WHEN SHE HAS DIARRHEA.  Problems with sedation-TROUBLE WAKING UP. IF EATS SPICY FOOD SHE HAS HEARTBURN, OTHERWISE THE PRILOSEC WORKS.  PT DENIES FEVER, CHILLS, nausea, vomiting, melena, CHEST PAIN, SHORTNESS OF BREATH, problems swallowing,OR heartburn or indigestion.   Past Medical History  Diagnosis Date  . Hypertension     Unspecified  . Coronary artery disease     Native vessel  . Arrhythmia     Atrial fibrillation  . Asthma   . GERD (gastroesophageal reflux disease)    Past Surgical History  Procedure Laterality Date  . Tubal ligation    . [place] surgery    . Cataract extraction    . Breast lumpectomy     No Known Allergies  Current Outpatient Prescriptions  Medication Sig Dispense Refill  . albuterol (PROAIR HFA) 108 (90 BASE) MCG/ACT inhaler Inhale 2 puffs into the lungs every 6 (six) hours as needed.    Marland Kitchen aspirin 325 MG EC tablet Take 325 mg by mouth daily.      . Calcium Carbonate-Vitamin D (CALTRATE 600+D) 600-400 MG-UNIT per tablet Take 1 tablet by mouth 2 (two) times daily.      Marland Kitchen diltiazem (CARDIZEM) 30 MG tablet Take 1 tablet (30 mg total) by mouth as needed. Take as needed for palpitations. WITH ACUTE EPISODES   . latanoprost (XALATAN) 0.005 % ophthalmic solution Place 1 drop into both eyes at bedtime.    Marland Kitchen lisinopril-hydrochlorothiazide  (PRINZIDE,ZESTORETIC) 20-25 MG per tablet Take 1 tablet by mouth daily.      . metoprolol tartrate (LOPRESSOR) 25 MG tablet Take 1 tablet (25 mg total) by mouth 2 (two) times daily.    Marland Kitchen omeprazole (PRILOSEC) 40 MG capsule Take 40 mg by mouth daily.      . simvastatin (ZOCOR) 20 MG tablet Take 20 mg by mouth daily.       Family History  Problem Relation Age of Onset  . Cancer Other   . Heart failure Other   . Colon cancer Mother   . Colon cancer Maternal Grandmother   . Colon polyps Neg Hx     Social History   Social History  . Marital Status: Married    Spouse Name: Heather Cunningham  . Number of Children: 1  . Years of Education: N/A   Occupational History  . Heather Cunningham Other   Social History Main Topics  . Smoking status: Former Smoker    Types: Cigarettes    Quit date: 12/17/1976  . Smokeless tobacco: Never Used     Comment: QUIT 35 YEARS AGO  . Alcohol Use: No  . Drug Use: No  . Sexual Activity: Not Asked   Other Topics Concern  . None   Social History Narrative   Pt gets regular exercise. 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 KIDS: 1 Heather Cunningham, [place] WAS IN THE [employer].   Review of Systems PER HPI OTHERWISE ALL SYSTEMS ARE NEGATIVE.     Objective:   Physical Exam  Constitutional: She is oriented to person, place, and time. She appears well-developed and well-nourished. No distress.  HENT:  Head: Normocephalic and atraumatic.  Mouth/Throat: Oropharynx is clear and moist. No oropharyngeal exudate.  Eyes: Pupils are equal, round, and reactive to light. No scleral icterus.  Neck: Normal range of motion. Neck supple.  Cardiovascular: Normal rate, regular rhythm and normal heart sounds.   Pulmonary/Chest: Effort normal and breath sounds normal. No respiratory distress.  Abdominal: Soft. Bowel sounds are normal. She exhibits no distension. There is no tenderness.  Musculoskeletal: She exhibits no edema.    Lymphadenopathy:    She has no cervical adenopathy.  Neurological: She is alert and oriented to person, place, and time.  NO FOCAL DEFICITS   Psychiatric: She has a normal mood and affect.  Vitals reviewed.         Assessment & Plan:

## 2015-09-05 NOTE — Interval H&P Note (Signed)
History and Physical Interval Note:  09/05/2015 11:25 AM  Heather Cunningham  has presented today for surgery, with the diagnosis of rectal bleeding  The various methods of treatment have been discussed with the patient and family. After consideration of risks, benefits and other options for treatment, the patient has consented to  Procedure(s) with comments: COLONOSCOPY (N/A) - 100 - moved to 10:30 - office to notify HEMORRHOID BANDING (N/A) as a surgical intervention .  The patient's history has been reviewed, patient examined, no change in status, stable for surgery.  I have reviewed the patient's chart and labs.  Questions were answered to the patient's satisfaction.     [employer]

## 2015-09-05 NOTE — Op Note (Addendum)
[facility] [address] [place], [address]   COLONOSCOPY PROCEDURE REPORT  PATIENT: Heather Cunningham, Heather Cunningham  MR#: [id number] BIRTHDATE: Jun 12, 1951 , 64  yrs. old GENDER: female ENDOSCOPIST: [name], MD REFERRED HU:TMLYY Howard, M.D. PROCEDURE DATE:  09/05/2015 PROCEDURE:   Colonoscopy, diagnostic INDICATIONS:hematochezia. MEDICATIONS: Demerol 50 mg IV and Versed 8 mg IV  DESCRIPTION OF PROCEDURE:    Physical exam was performed.  Informed consent was obtained from the patient after explaining the benefits, risks, and alternatives to procedure.  The patient was connected to monitor and placed in left lateral position. Continuous oxygen was provided by nasal cannula and IV medicine administered through an indwelling cannula.  After administration of sedation and rectal exam, the patients rectum was intubated and the EC-[id number] ([id number])  colonoscope was advanced under direct visualization to the MID-TRANSVERSE COLON.  The scope was removed slowly by carefully examining the color, texture, anatomy, and integrity mucosa on the way out.  The patient was recovered in endoscopy and discharged home in satisfactory condition. Estimated blood loss is zero unless otherwise noted in this procedure report.      COLON FINDINGS: The colon was redundant.  Manual abdominal counter-pressure was used to reach the MID-TRANSVERSE COLON. INCOMPLETE EXAM TO THE MID-TRANSVERSE COLON. PT AGITATED AND REQUESTED TCS BE STOPPED.  NO POLYPS OR DIVERTICULA SEEN, Small internal hemorrhoids were found.  , and Moderate sized external hemorrhoids were found.  PREP QUALITY: excellent. CECAL W/D TIME: NONE       minutes  COMPLICATIONS: None  ENDOSCOPIC IMPRESSION: 1.   The LEFT colon IS redundant 2.   INCOMPLETE EXAM TO THE MID-TRANSVERSE COLON. 3.   RECTAL BLEEDING DUE TO Small internal hemorrhoids 4.   Moderate sized external hemorrhoids  RECOMMENDATIONS: FOLLOW A HIGH FIBER DIET. USE  PREPARATION H CREAM OR SUPPOSITORIES AS NEEDED FOR RECTAL BLEEDING OR PAIN. Next colonoscopy WITHIN THE NEXT MONTH WITH MAC AND PEDS SCOPE. CONSIDER OVERTUBE.  _______________________________ Lorrin MaisDanie Binder, MD Oct 13, 2015 7:36 AM Revised: 2015-10-13 7:36 AM   CPT CODES: ICD CODES:  The ICD and CPT codes recommended by this software are interpretations from the data that the clinical staff has captured with the software.  The verification of the translation of this report to the ICD and CPT codes and modifiers is the sole responsibility of the health care institution and practicing physician where this report was generated.  [place]. will not be held responsible for the validity of the ICD and CPT codes included on this report.  AMA assumes no liability for data contained or not contained herein. CPT is a Designer, television/film set of the [employer].

## 2015-09-06 NOTE — Telephone Encounter (Signed)
Pt called office and doesn't want to reschedule her repeat TCS until she sees Dr. Oneida Alar first.  Pt request appt on 09/27/2015 with [facility]

## 2015-09-07 ENCOUNTER — Encounter (HOSPITAL_COMMUNITY): Payer: Self-pay | Admitting: Gastroenterology

## 2015-09-27 ENCOUNTER — Ambulatory Visit (INDEPENDENT_AMBULATORY_CARE_PROVIDER_SITE_OTHER): Payer: Medicare Other | Admitting: Gastroenterology

## 2015-09-27 ENCOUNTER — Other Ambulatory Visit: Payer: Self-pay

## 2015-09-27 ENCOUNTER — Encounter: Payer: Self-pay | Admitting: Gastroenterology

## 2015-09-27 VITALS — BP 115/73 | HR 69 | Temp 97.0°F | Ht 65.0 in | Wt 171.2 lb

## 2015-09-27 DIAGNOSIS — K625 Hemorrhage of anus and rectum: Secondary | ICD-10-CM

## 2015-09-27 MED ORDER — SOD PICOSULFATE-MAG OX-CIT ACD 10-3.5-12 MG-GM-GM PO PACK
1.0000 | PACK | ORAL | Status: DC
Start: 2015-09-27 — End: 2015-09-30

## 2015-09-27 NOTE — Assessment & Plan Note (Addendum)
CHRONIC INTERMITTENT SYMPTOMS NOT CONTROLLED AND EVALUATION INCOMPLETE DUET TCS INCOMPLETE SEP 2016 DUE TO AGITATION IN SPITE OF ADEQUATE SEDATION.  TCS W/ MAC NOV 14 AT 0830-PREPOPIK SAMPLE. DISCUSSED PROCEDURE, BENEFITS, & RISKS: < 1% chance of medication reaction, bleeding, perforation, or rupture of spleen/liver. FOLLOW UP IN 6 MOS.

## 2015-09-27 NOTE — Patient Instructions (Addendum)
FULL LIQUID DIET NOV 13 WITH BREAKFAST. SEE INFO BELOW.  COLONOSCOPY TUES NOV 14.  FOLLOW UP IN 6 MOS.    Full Liquid Diet A high-calorie, high-protein supplement should be used to meet your nutritional requirements when the full liquid diet is continued for more than 2 or 3 days. If this diet is to be used for an extended period of time (more than 7 days), a multivitamin should be considered.  Breads and Starches  Allowed: None are allowed except crackersWHOLE OR pureed (made into a thick, smooth soup) in soup.   Avoid: Any others.    Potatoes/Pasta/Rice  Allowed: ANY ITEM AS A SOUP OR SMALL PLATE OF MASHED POTATOES OR SCRAMBLED EGGS.       Vegetables  Allowed: Strained tomato or vegetable juice. Vegetables pureed in soup.   Avoid: Any others.    Fruit  Allowed: Any strained fruit juices and fruit drinks. Include 1 serving of citrus or vitamin C-enriched fruit juice daily.   Avoid: Any others.  Meat and Meat Substitutes  Allowed: Egg  Avoid: Any meat, fish, or fowl. All cheese.  Milk  Allowed: SOY Milk beverages, including milk shakes and instant breakfast mixes. Smooth yogurt.   Avoid: Any others. Avoid dairy products if not tolerated.    Soups and Combination Foods  Allowed: Broth, strained cream soups. Strained, broth-based soups.   Avoid: Any others.    Desserts and Sweets  Allowed: flavored gelatin, tapioca, ice cream, sherbet, smooth pudding, junket, fruit ices, frozen ice pops, pudding pops, frozen fudge pops, chocolate syrup. Sugar, honey, jelly, syrup.   Avoid: Any others.  Fats and Oils  Allowed: Margarine, butter, cream, sour cream, oils.   Avoid: Any others.  Beverages  Allowed: All.   Avoid: None.  Condiments  Allowed: Iodized salt, pepper, spices, flavorings. Cocoa powder.   Avoid: Any others.    SAMPLE MEAL PLAN Breakfast   cup orange juice.   1 OR 2 EGGS  1 cup milk.   1 cup beverage (coffee or tea).   Cream or  sugar, if desired.    Midmorning Snack  2 SCRAMBLED OR HARD BOILED EGG   Lunch  1 cup cream soup.    cup fruit juice.   1 cup milk.    cup custard.   1 cup beverage (coffee or tea).   Cream or sugar, if desired.    Midafternoon Snack  1 cup milk shake.  Dinner  1 cup cream soup.    cup fruit juice.   1 cup MILK    cup pudding.   1 cup beverage (coffee or tea).   Cream or sugar, if desired.  Evening Snack  1 cup supplement.  To increase calories, add sugar, cream, butter, or margarine if possible. Nutritional supplements will also increase the total calories.

## 2015-09-27 NOTE — Progress Notes (Signed)
ON RECALL  °

## 2015-09-27 NOTE — Progress Notes (Signed)
   Subjective:    Patient ID: Heather Cunningham, female    DOB: 01/17/1951, 64 y.o.   MRN: [id number]   [name], MD  HPI Didn't want to go through another TCS UNLESS SHE HAD TO. HAD PREPOPIK. FELT ANXIOUS ABOUT TCS. WANTED TO KNOW IF SHE NEEDED ANOTHER ONE. SEEING CARDIOLOGIST FRI ABOUT HER HEART. RARE BRBPR. BMs: DAILY WITH COLACE(#3-4). HAD ABDOMINAL PAIN YESTERDAY AND EXPLOSIVE DIARRHEA. Problems with sedation-AGITATED DURING TCS IN SPITE OF ADEQUATE SEDATION.   PT DENIES FEVER, CHILLS, HEMATEMESIS, nausea, vomiting, melena, CHEST PAIN, SHORTNESS OF BREATH,  CHANGE IN BOWEL IN HABITS, constipation,  problems swallowing,OR heartburn or indigestion.  Past Medical History  Diagnosis Date  . Hypertension     Unspecified  . Coronary artery disease     Native vessel  . Arrhythmia     Atrial fibrillation  . Asthma   . GERD (gastroesophageal reflux disease)     Past Surgical History  Procedure Laterality Date  . Tubal ligation    . [place] surgery    . Cataract extraction    . Breast lumpectomy    . Colonoscopy N/A 09/05/2015    Procedure: COLONOSCOPY;  Surgeon: [name], MD;  Location: [facility];  Service: Endoscopy;  Laterality: N/A;  incomplete colonoscopy   No Known Allergies  Current Outpatient Prescriptions  Medication Sig Dispense Refill  . albuterol (PROAIR HFA) 108 (90 BASE) MCG/ACT inhaler Inhale 2 puffs into the lungs every 6 (six) hours as needed.    Marland Kitchen aspirin 325 MG EC tablet Take 325 mg by mouth daily.      . Calcium Carbonate-Vitamin D (CALTRATE 600+D) 600-400 MG-UNIT per tablet Take 1 tablet by mouth 2 (two) times daily.      Marland Kitchen diltiazem (CARDIZEM) 30 MG tablet Take 1 tablet (30 mg total) by mouth as needed. Take as needed for palpitations.    Marland Kitchen latanoprost (XALATAN) 0.005 % ophthalmic solution Place 1 drop into both eyes at bedtime.    Marland Kitchen lisinopril-hydrochlorothiazide (PRINZIDE,ZESTORETIC) 20-25 MG per tablet Take 1 tablet by mouth daily.        . metoprolol tartrate (LOPRESSOR) 25 MG tablet Take 1 tablet (25 mg total) by mouth 2 (two) times daily.    Marland Kitchen omeprazole (PRILOSEC) 40 MG capsule Take 40 mg by mouth daily.      . simvastatin (ZOCOR) 20 MG tablet Take 20 mg by mouth daily.       Review of Systems PER HPI OTHERWISE ALL SYSTEMS ARE NEGATIVE.    Objective:   Physical Exam  Constitutional: She is oriented to person, place, and time. She appears well-developed and well-nourished. No distress.  HENT:  Head: Normocephalic and atraumatic.  Mouth/Throat: Oropharynx is clear and moist. No oropharyngeal exudate.  Eyes: Pupils are equal, round, and reactive to light. No scleral icterus.  Neck: Normal range of motion. Neck supple.  Cardiovascular: Normal rate, regular rhythm and normal heart sounds.   Pulmonary/Chest: Effort normal and breath sounds normal. No respiratory distress.  Abdominal: Soft. Bowel sounds are normal. She exhibits no distension. There is no tenderness.  Musculoskeletal: She exhibits no edema.  Lymphadenopathy:    She has no cervical adenopathy.  Neurological: She is alert and oriented to person, place, and time.  Psychiatric: She has a normal mood and affect.  Vitals reviewed.     Assessment & Plan:

## 2015-09-27 NOTE — Progress Notes (Signed)
cc'ed to pcp °

## 2015-09-30 ENCOUNTER — Ambulatory Visit (INDEPENDENT_AMBULATORY_CARE_PROVIDER_SITE_OTHER): Payer: Medicare Other | Admitting: Cardiovascular Disease

## 2015-09-30 ENCOUNTER — Encounter: Payer: Self-pay | Admitting: Cardiovascular Disease

## 2015-09-30 VITALS — BP 110/76 | HR 66 | Ht 65.0 in | Wt 170.0 lb

## 2015-09-30 DIAGNOSIS — R5383 Other fatigue: Secondary | ICD-10-CM

## 2015-09-30 DIAGNOSIS — I480 Paroxysmal atrial fibrillation: Secondary | ICD-10-CM | POA: Diagnosis not present

## 2015-09-30 DIAGNOSIS — R001 Bradycardia, unspecified: Secondary | ICD-10-CM

## 2015-09-30 DIAGNOSIS — R002 Palpitations: Secondary | ICD-10-CM

## 2015-09-30 DIAGNOSIS — I10 Essential (primary) hypertension: Secondary | ICD-10-CM

## 2015-09-30 DIAGNOSIS — Z7189 Other specified counseling: Secondary | ICD-10-CM

## 2015-09-30 MED ORDER — RIVAROXABAN 20 MG PO TABS: 20.0000 mg | ORAL_TABLET | Freq: Every day | ORAL | Status: DC

## 2015-09-30 NOTE — Progress Notes (Signed)
Patient ID: Heather Cunningham, female   DOB: [birth year], 64 y.o.   MRN: [id number]      SUBJECTIVE: The patient presents for follow-up of paroxysmal atrial fibrillation and hypertension.  She denies chest pain and shortness of breath. She has been feeling more fatigued lately. She has had significant episodes of palpitations but says her "heart rate is slow and irregular". She measured her heart rate at 50 bpm on one occasion.   She was unable to be sufficiently sedated to proceed with a colonoscopy because her blood pressure was too low, and this has been rescheduled for November 15. She was going to have hemorrhoid banding but was told her hemorrhoids no longer need banding. She is also dealing with bilateral carpal tunnel syndrome and is using splints, and also has some cervical spine arthritic issues.  She wanted to talk about anticoagulation, which we discussed at her last appointment nearly 1 year ago.  ECG performed in the office today demonstrates normal sinus rhythm with no ischemic ST segment or T-wave abnormalities, nor any arrhythmias.   Review of Systems: As per "subjective", otherwise negative.  No Known Allergies  Current Outpatient Prescriptions  Medication Sig Dispense Refill  . albuterol (PROAIR HFA) 108 (90 BASE) MCG/ACT inhaler Inhale 2 puffs into the lungs every 6 (six) hours as needed.    Marland Kitchen aspirin 325 MG EC tablet Take 325 mg by mouth daily.      . Calcium Carbonate-Vitamin D (CALTRATE 600+D) 600-400 MG-UNIT per tablet Take 1 tablet by mouth 2 (two) times daily.      Marland Kitchen diltiazem (CARDIZEM) 30 MG tablet Take 1 tablet (30 mg total) by mouth as needed. Take as needed for palpitations. 30 tablet 3  . docusate sodium (COLACE) 100 MG capsule Take 100 mg by mouth daily.    . Inulin (PHILLIPS FIBER GOOD PO) Take by mouth daily.    Marland Kitchen latanoprost (XALATAN) 0.005 % ophthalmic solution Place 1 drop into both eyes at bedtime.    Marland Kitchen lisinopril-hydrochlorothiazide (PRINZIDE,ZESTORETIC)  20-25 MG per tablet Take 1 tablet by mouth daily.      . metoprolol tartrate (LOPRESSOR) 25 MG tablet Take 1 tablet (25 mg total) by mouth 2 (two) times daily. 180 tablet 3  . omeprazole (PRILOSEC) 40 MG capsule Take 40 mg by mouth daily.      . simvastatin (ZOCOR) 20 MG tablet Take 20 mg by mouth daily.       No current facility-administered medications for this visit.    Past Medical History  Diagnosis Date  . Hypertension     Unspecified  . Coronary artery disease     Native vessel  . Arrhythmia     Atrial fibrillation  . Asthma   . GERD (gastroesophageal reflux disease)     Past Surgical History  Procedure Laterality Date  . Tubal ligation    . [place] surgery    . Cataract extraction    . Breast lumpectomy    . Colonoscopy N/A 09/05/2015    Procedure: COLONOSCOPY;  Surgeon: [name], MD;  [facility];  Service: Endoscopy;  Laterality: N/A;  incomplete colonoscopy    Social History   Social History  . Marital Status: Married    Spouse Name: [name]  . Number of Children: 1  . Years of Education: N/A   Occupational History  . [employer] Other   Social History Main Topics  . Smoking status: Former Smoker  Types: Cigarettes    Quit date: 12/17/1976  . Smokeless tobacco: Never Used     Comment: QUIT 35 YEARS AGO  . Alcohol Use: No  . Drug Use: No  . Sexual Activity: Not on file   Other Topics Concern  . Not on file   Social History Narrative   Pt gets regular exercise. 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 WORKED FOR [employer]. [employer]. KIDS: [place], [place] WAS IN [employer].     Filed Vitals:   09/30/15 1300  BP: 110/76  Pulse: 66  Height: 5\' 5"  (1.651 m)  Weight: 170 lb (77.111 kg)  SpO2: 96%    PHYSICAL EXAM General: NAD HEENT: Normal. Neck: No JVD, no thyromegaly. Lungs: Clear to auscultation bilaterally with normal respiratory effort. CV: Nondisplaced PMI.  Regular  rate and rhythm, normal S1/S2, no S3/S4, no murmur. No pretibial or periankle edema.  No carotid bruit.  Normal pedal pulses.  Abdomen: Soft, nontender, no hepatosplenomegaly, no distention.  Neurologic: Alert and oriented x 3.  Psych: Normal affect. Skin: Normal. Musculoskeletal: Normal range of motion, no gross deformities. Extremities: No clubbing or cyanosis.   ECG: Most recent ECG reviewed.      ASSESSMENT AND PLAN: 1. Palpitations, bradycardia, and fatigue in context of paroxysmal atrial fibrillation: Given complaints of fatigue and bradycardia, will order a 30-day event monitor. Prior 3-week event monitor showed sinus rhythm with PVC's.  Continue metoprolol 25 mg bid. Has not had to use diltiazem in over a year. I again had a long discussion with her regarding anticoagulation and the annual risk of stroke with paroxysmal atrial fibrillation. CHA2DS2VASC score is 3 (age, HTN, gender). Will start Xarelto 20 mg daily.  2. Essential HTN: Well controlled on current therapy. No changes.  Dispo: f/u 2 months.  Kate Sable, M.D., F.A.C.C.

## 2015-09-30 NOTE — Patient Instructions (Addendum)
Your physician has recommended you make the following change in your medication:  Start xarelto 20 mg daily. Please use the first 30 day free coupon and the samples given to you today. Stop aspirin. Continue all other medications the same. Your physician has recommended that you wear an event monitor for 30 days. Event monitors are medical devices that record the heart's electrical activity. Doctors most often [place] these monitors to diagnose arrhythmias. Arrhythmias are problems with the speed or rhythm of the heartbeat. The monitor is a small, portable device. You can wear one while you do your normal daily activities. This is usually used to diagnose what is causing palpitations/syncope (passing out). [facility] will contact you directly about this monitor. Your physician recommends that you schedule a follow-up appointment in: 2 months.

## 2015-10-03 ENCOUNTER — Ambulatory Visit (INDEPENDENT_AMBULATORY_CARE_PROVIDER_SITE_OTHER): Payer: Medicare Other

## 2015-10-03 DIAGNOSIS — I480 Paroxysmal atrial fibrillation: Secondary | ICD-10-CM

## 2015-10-03 DIAGNOSIS — R002 Palpitations: Secondary | ICD-10-CM

## 2015-10-06 ENCOUNTER — Telehealth: Payer: Self-pay

## 2015-10-06 NOTE — Telephone Encounter (Signed)
Pt has already started the blood thinner. She would like to wait until after everything has been straighten out.

## 2015-10-06 NOTE — Telephone Encounter (Signed)
Pt called to cancel her TCS because she is having some heart trouble. She was also put on a blood thinner.

## 2015-10-06 NOTE — Telephone Encounter (Signed)
REVIEWED-NO ADDITIONAL RECOMMENDATIONS. 

## 2015-10-06 NOTE — Telephone Encounter (Addendum)
REVIEWED-PLEASE CALL PT. SHE SHOULD HAVE A COMPLETE TCS BEFORE STARTING BLOOD THINNER. SHE HAS AN INCOMPLETE EVLAUATION OF HER COLON.

## 2015-10-07 ENCOUNTER — Telehealth: Payer: Self-pay

## 2015-10-07 NOTE — Telephone Encounter (Signed)
Pt called this morning to go ahead with the TCS. She is on the schedule for 11/01/15

## 2015-10-07 NOTE — Telephone Encounter (Signed)
REVIEWED-NO ADDITIONAL RECOMMENDATIONS. 

## 2015-10-07 NOTE — Telephone Encounter (Signed)
Message     Cathey,    Dr. Oneida Alar prefers Mrs. Heather Cunningham to hold off on Xarelto until she has had a complete evaluation of her colon. According to patient during her visit with me, her hemorrhoids did not need banding.    Please speak with patient as she appears to have cancelled an appt with GI?        Dr. Raliegh Ip      I spoke with patient and she will STOP Xarelto and make an apt with GI

## 2015-10-13 DIAGNOSIS — Z961 Presence of intraocular lens: Secondary | ICD-10-CM | POA: Diagnosis not present

## 2015-10-13 DIAGNOSIS — H40053 Ocular hypertension, bilateral: Secondary | ICD-10-CM | POA: Diagnosis not present

## 2015-10-13 DIAGNOSIS — H5213 Myopia, bilateral: Secondary | ICD-10-CM | POA: Diagnosis not present

## 2015-10-13 DIAGNOSIS — H59813 Chorioretinal scars after surgery for detachment, bilateral: Secondary | ICD-10-CM | POA: Diagnosis not present

## 2015-10-13 DIAGNOSIS — H43813 Vitreous degeneration, bilateral: Secondary | ICD-10-CM | POA: Diagnosis not present

## 2015-10-13 DIAGNOSIS — H029 Unspecified disorder of eyelid: Secondary | ICD-10-CM | POA: Diagnosis not present

## 2015-10-13 DIAGNOSIS — H02833 Dermatochalasis of right eye, unspecified eyelid: Secondary | ICD-10-CM | POA: Diagnosis not present

## 2015-10-13 DIAGNOSIS — H02836 Dermatochalasis of left eye, unspecified eyelid: Secondary | ICD-10-CM | POA: Diagnosis not present

## 2015-10-26 NOTE — Patient Instructions (Signed)
Heather Cunningham  10/26/2015     @PREFPERIOPPHARMACY @   Your procedure is scheduled on 11/01/2015.  Report to [facility] at 6:15 A.M.  Call this number if you have problems the morning of surgery:  [phone]   Remember:  Do not eat food or drink liquids after midnight.  Take these medicines the morning of surgery with A SIP OF WATER Albuterol (bring with you to hospital, as well), Cardizem,  Metoprolol, Prilosec   Do not wear jewelry, make-up or nail polish.  Do not wear lotions, powders, or perfumes.  You may wear deodorant.  Do not shave 48 hours prior to surgery.  Men may shave face and neck.  Do not bring valuables to the hospital.  [facility] is not responsible for any belongings or valuables.  Contacts, dentures or bridgework may not be worn into surgery.  Leave your suitcase in the car.  After surgery it may be brought to your room.  For patients admitted to the hospital, discharge time will be determined by your treatment team.  Patients discharged the day of surgery will not be allowed to drive home.    Please read over the following fact sheets that you were given. Anesthesia Post-op Instructions    PATIENT INSTRUCTIONS POST-ANESTHESIA  IMMEDIATELY FOLLOWING SURGERY:  Do not drive or operate machinery for the first twenty four hours after surgery.  Do not make any important decisions for twenty four hours after surgery or while taking narcotic pain medications or sedatives.  If you develop intractable nausea and vomiting or a severe headache please notify your doctor immediately.  FOLLOW-UP:  Please make an appointment with your surgeon as instructed. You do not need to follow up with anesthesia unless specifically instructed to do so.  WOUND CARE INSTRUCTIONS (if applicable):  Keep a dry clean dressing on the anesthesia/puncture wound site if there is drainage.  Once the wound has quit draining you may leave it open to air.  Generally you should leave the bandage  intact for twenty four hours unless there is drainage.  If the epidural site drains for more than 36-48 hours please call the anesthesia department.  QUESTIONS?:  Please feel free to call your physician or the hospital operator if you have any questions, and they will be happy to assist you.      Colonoscopy A colonoscopy is an exam to look at the entire large intestine (colon). This exam can help find problems such as tumors, polyps, inflammation, and areas of bleeding. The exam takes about 1 hour.  LET [facility] CARE PROVIDER KNOW ABOUT:   Any allergies you have.  All medicines you are taking, including vitamins, herbs, eye drops, creams, and over-the-counter medicines.  Previous problems you or members of your family have had with the use of anesthetics.  Any blood disorders you have.  Previous surgeries you have had.  Medical conditions you have. RISKS AND COMPLICATIONS  Generally, this is a safe procedure. However, as with any procedure, complications can occur. Possible complications include:  Bleeding.  Tearing or rupture of the colon wall.  Reaction to medicines given during the exam.  Infection (rare). BEFORE THE PROCEDURE   Ask your health care provider about changing or stopping your regular medicines.  You may be prescribed an oral bowel prep. This involves drinking a large amount of medicated liquid, starting the day before your procedure. The liquid will cause you to have multiple loose stools until your stool is almost clear or  light green. This cleans out your colon in preparation for the procedure.  Do not eat or drink anything else once you have started the bowel prep, unless your health care provider tells you it is safe to do so.  Arrange for someone to drive you home after the procedure. PROCEDURE   You will be given medicine to help you relax (sedative).  You will lie on your side with your knees bent.  A long, flexible tube with a light and camera  on the end (colonoscope) will be inserted through the rectum and into the colon. The camera sends video back to a computer screen as it moves through the colon. The colonoscope also releases carbon dioxide gas to inflate the colon. This helps your health care provider see the area better.  During the exam, your health care provider may take a small tissue sample (biopsy) to be examined under a microscope if any abnormalities are found.  The exam is finished when the entire colon has been viewed. AFTER THE PROCEDURE   Do not drive for 24 hours after the exam.  You may have a small amount of blood in your stool.  You may pass moderate amounts of gas and have mild abdominal cramping or bloating. This is caused by the gas used to inflate your colon during the exam.  Ask when your test results will be ready and how you will get your results. Make sure you get your test results.   This information is not intended to replace advice given to you by your health care provider. Make sure you discuss any questions you have with your health care provider.   Document Released: 11/30/2000 Document Revised: 09/23/2013 Document Reviewed: 08/10/2013 Elsevier Interactive Patient Education [employer].

## 2015-10-27 ENCOUNTER — Encounter (HOSPITAL_COMMUNITY)
Admission: RE | Admit: 2015-10-27 | Discharge: 2015-10-27 | Disposition: A | Discharge status: Home / Self Care | Payer: Medicare Other | Source: Ambulatory Visit | Attending: Gastroenterology | Admitting: Gastroenterology

## 2015-10-27 ENCOUNTER — Encounter (HOSPITAL_COMMUNITY): Payer: Self-pay

## 2015-10-27 DIAGNOSIS — Z01818 Encounter for other preprocedural examination: Secondary | ICD-10-CM | POA: Insufficient documentation

## 2015-10-27 DIAGNOSIS — K625 Hemorrhage of anus and rectum: Secondary | ICD-10-CM | POA: Diagnosis not present

## 2015-10-27 HISTORY — DX: Other specified postprocedural states: Z98.890

## 2015-10-27 HISTORY — DX: Nausea with vomiting, unspecified: R11.2

## 2015-10-27 HISTORY — DX: Unspecified glaucoma: H40.9

## 2015-10-27 HISTORY — DX: Other specified postprocedural states: R11.2

## 2015-10-27 HISTORY — DX: Adverse effect of unspecified anesthetic, initial encounter: T41.45XA

## 2015-10-27 LAB — CBC
HCT: 41.4 % (ref 36.0–46.0)
Hemoglobin: 13.9 g/dL (ref 12.0–15.0)
MCH: 30.8 pg (ref 26.0–34.0)
MCHC: 33.6 g/dL (ref 30.0–36.0)
MCV: 91.8 fL (ref 78.0–100.0)
Platelets: 277 10*3/uL (ref 150–400)
RBC: 4.51 MIL/uL (ref 3.87–5.11)
RDW: 12.8 % (ref 11.5–15.5)
WBC: 8.1 10*3/uL (ref 4.0–10.5)

## 2015-10-27 LAB — BASIC METABOLIC PANEL
Anion gap: 9 (ref 5–15)
BUN: 14 mg/dL (ref 6–20)
CO2: 32 mmol/L (ref 22–32)
Calcium: 9.6 mg/dL (ref 8.9–10.3)
Chloride: 98 mmol/L — ABNORMAL LOW (ref 101–111)
Creatinine, Ser: 0.56 mg/dL (ref 0.44–1.00)
GFR calc Af Amer: >60 mL/min (ref >60)
GFR calc non Af Amer: >60 mL/min (ref >60)
Glucose, Bld: 115 mg/dL — ABNORMAL HIGH (ref 65–99)
Potassium: 3.7 mmol/L (ref 3.5–5.1)
Sodium: 139 mmol/L (ref 135–145)

## 2015-10-29 ENCOUNTER — Other Ambulatory Visit: Payer: Self-pay

## 2015-10-29 ENCOUNTER — Telehealth: Payer: Self-pay | Admitting: Internal Medicine

## 2015-10-29 ENCOUNTER — Emergency Department (HOSPITAL_COMMUNITY)
Admission: EM | Admit: 2015-10-29 | Discharge: 2015-10-29 | Disposition: A | Discharge status: Home / Self Care | Payer: Medicare Other | Attending: Emergency Medicine | Admitting: Emergency Medicine

## 2015-10-29 ENCOUNTER — Encounter (HOSPITAL_COMMUNITY): Payer: Self-pay | Admitting: Emergency Medicine

## 2015-10-29 ENCOUNTER — Emergency Department (HOSPITAL_COMMUNITY): Payer: Medicare Other

## 2015-10-29 DIAGNOSIS — I10 Essential (primary) hypertension: Secondary | ICD-10-CM | POA: Diagnosis not present

## 2015-10-29 DIAGNOSIS — Z87891 Personal history of nicotine dependence: Secondary | ICD-10-CM | POA: Insufficient documentation

## 2015-10-29 DIAGNOSIS — J45909 Unspecified asthma, uncomplicated: Secondary | ICD-10-CM | POA: Diagnosis not present

## 2015-10-29 DIAGNOSIS — I2510 Atherosclerotic heart disease of native coronary artery without angina pectoris: Secondary | ICD-10-CM | POA: Insufficient documentation

## 2015-10-29 DIAGNOSIS — H409 Unspecified glaucoma: Secondary | ICD-10-CM | POA: Insufficient documentation

## 2015-10-29 DIAGNOSIS — K219 Gastro-esophageal reflux disease without esophagitis: Secondary | ICD-10-CM | POA: Diagnosis not present

## 2015-10-29 DIAGNOSIS — I499 Cardiac arrhythmia, unspecified: Secondary | ICD-10-CM | POA: Diagnosis not present

## 2015-10-29 DIAGNOSIS — Z79899 Other long term (current) drug therapy: Secondary | ICD-10-CM | POA: Diagnosis not present

## 2015-10-29 DIAGNOSIS — Z7982 Long term (current) use of aspirin: Secondary | ICD-10-CM | POA: Diagnosis not present

## 2015-10-29 DIAGNOSIS — R0989 Other specified symptoms and signs involving the circulatory and respiratory systems: Secondary | ICD-10-CM | POA: Diagnosis not present

## 2015-10-29 DIAGNOSIS — R009 Unspecified abnormalities of heart beat: Secondary | ICD-10-CM

## 2015-10-29 DIAGNOSIS — R008 Other abnormalities of heart beat: Secondary | ICD-10-CM | POA: Insufficient documentation

## 2015-10-29 DIAGNOSIS — Z794 Long term (current) use of insulin: Secondary | ICD-10-CM | POA: Diagnosis not present

## 2015-10-29 LAB — BASIC METABOLIC PANEL
Anion gap: 9 (ref 5–15)
BUN: 22 mg/dL — ABNORMAL HIGH (ref 6–20)
CO2: 28 mmol/L (ref 22–32)
Calcium: 9.2 mg/dL (ref 8.9–10.3)
Chloride: 104 mmol/L (ref 101–111)
Creatinine, Ser: 0.69 mg/dL (ref 0.44–1.00)
GFR calc Af Amer: >60 mL/min (ref >60)
GFR calc non Af Amer: >60 mL/min (ref >60)
Glucose, Bld: 110 mg/dL — ABNORMAL HIGH (ref 65–99)
Potassium: 3.4 mmol/L — ABNORMAL LOW (ref 3.5–5.1)
Sodium: 141 mmol/L (ref 135–145)

## 2015-10-29 LAB — CBC
HCT: 39.6 % (ref 36.0–46.0)
Hemoglobin: 13.3 g/dL (ref 12.0–15.0)
MCH: 30.6 pg (ref 26.0–34.0)
MCHC: 33.6 g/dL (ref 30.0–36.0)
MCV: 91 fL (ref 78.0–100.0)
Platelets: 266 10*3/uL (ref 150–400)
RBC: 4.35 MIL/uL (ref 3.87–5.11)
RDW: 12.9 % (ref 11.5–15.5)
WBC: 10 10*3/uL (ref 4.0–10.5)

## 2015-10-29 LAB — TSH: TSH: 1.835 u[IU]/mL (ref 0.350–4.500)

## 2015-10-29 LAB — T4, FREE: Free T4: 0.9 ng/dL (ref 0.61–1.12)

## 2015-10-29 LAB — TROPONIN I: Troponin I: <0.03 ng/mL (ref <0.031)

## 2015-10-29 LAB — MAGNESIUM: Magnesium: 1.9 mg/dL (ref 1.7–2.4)

## 2015-10-29 MED ORDER — POTASSIUM CHLORIDE CRYS ER 20 MEQ PO TBCR
20.0000 meq | EXTENDED_RELEASE_TABLET | Freq: Once | ORAL | Status: AC
  Administered 2015-10-29: 20 meq via ORAL
  Filled 2015-10-29: qty 1

## 2015-10-29 MED ORDER — POTASSIUM CHLORIDE 20 MEQ PO PACK: 20.0000 meq | PACK | Freq: Once | ORAL | Status: DC

## 2015-10-29 NOTE — Discharge Instructions (Signed)
Please follow-up with your cardiologist as an outpatient. Continue your event monitoring. Return without fail for worsening symptoms including passing out, chest pain, difficulty breathing, or any other symptoms concerning to you.   it was recommended by the cardiologist today that you decrease her dose of metoprolol to 12.5 mg 2 times a day instead of 25 mg 2 times a day.   Palpitations A palpitation is the feeling that your heartbeat is irregular. It may feel like your heart is fluttering or skipping a beat. It may also feel like your heart is beating faster than normal. This is usually not a serious problem. In some cases, you may need more medical tests. HOME CARE  Avoid:  Caffeine in coffee, tea, soft drinks, diet pills, and energy drinks.  Chocolate.  Alcohol.  Stop smoking if you smoke.  Reduce your stress and anxiety. Try:  A method that measures bodily functions so you can learn to control them (biofeedback).  Yoga.  Meditation.  Physical activity such as swimming, jogging, or walking.  Get plenty of rest and sleep. GET HELP IF:  Your fast or irregular heartbeat continues after 24 hours.  Your palpitations occur more often. GET HELP RIGHT AWAY IF:   You have chest pain.  You feel short of breath.  You have a very bad headache.  You feel dizzy or pass out (faint). MAKE SURE YOU:   Understand these instructions.  Will watch your condition.  Will get help right away if you are not doing well or get worse.   This information is not intended to replace advice given to you by your health care provider. Make sure you discuss any questions you have with your health care provider.   Document Released: 09/11/2008 Document Revised: 12/24/2014 Document Reviewed: 02/01/2012 Elsevier Interactive Patient Education 2016 Elsevier Inc.  Holter Monitoring A Holter monitor is a small device that is used to detect abnormal heart rhythms. It clips to your clothing and  is connected by wires to flat, sticky disks (electrodes) that attach to your chest. It is worn continuously for 24-48 hours. HOME CARE INSTRUCTIONS  Wear your Holter monitor at all times, even while exercising and sleeping, for as long as directed by your health care provider.  Make sure that the Holter monitor is safely clipped to your clothing or close to your body as recommended by your health care provider.  Do not get the monitor or wires wet.  Do not put body lotion or moisturizer on your chest.  Keep your skin clean.  Keep a diary of your daily activities, such as walking and doing chores. If you feel that your heartbeat is abnormal or that your heart is fluttering or skipping a beat:  Record what you are doing when it happens.  Record what time of day the symptoms occur.  Return your Holter monitor as directed by your health care provider.  Keep all follow-up visits as directed by your health care provider. This is important. SEEK IMMEDIATE MEDICAL CARE IF:  You feel lightheaded or you faint.  You have trouble breathing.  You feel pain in your chest, upper arm, or jaw.  You feel sick to your stomach and your skin is pale, cool, or damp.  You heartbeat feels unusual or abnormal.   This information is not intended to replace advice given to you by your health care provider. Make sure you discuss any questions you have with your health care provider.   Document Released: 08/31/2004 Document Revised: 12/24/2014  Document Reviewed: 07/12/2014 Elsevier Interactive Patient Education [employer].

## 2015-10-29 NOTE — Telephone Encounter (Signed)
On Call Cardiology   Dr. Doroteo Bradford from [place] called. Patient reported slow and irregular pulse rate associated with generalized weakness. No syncope or angina. ECG in the ER normal sinus rhythm with normal AV conduction and narrow QRS. K 3.4. She is being followed by Dr. Bronson Ing (cardio) whoc saw her on 10/14 /16 and placed a 30 day event monitor for similar symptoms. She is on Xarelto for PAF and Metoprolol 25 mg po bid. Apparently Diltiazem is a prn medication.  BP in ER 144/79.   ECG in 2009 and 2014 had AF, other ECGs sinus rhythm.    Recs:  - I do not see any reason for inpatient evaluation. She already has a 30 day event monitor with 3 days remaining. She may have tachy-brady syndrome (sick sinus syndrome).  - Reduce Metoprolol from 25 bid to 12.5 mg po bid  - Continue follow up with Dr. Bronson Ing as planned.    Wandra Mannan, MD

## 2015-10-29 NOTE — ED Notes (Signed)
MD at bedside. 

## 2015-10-29 NOTE — ED Provider Notes (Signed)
CSN: [id number]     Arrival date & time 10/29/15  1534 History   First MD Initiated Contact with Patient 10/29/15 1613     Chief Complaint  Patient presents with  . Irregular Heart Beat     (Consider location/radiation/quality/duration/timing/severity/associated sxs/prior Treatment) HPI   64 year old female who presents with irregular heart beat. History of paroxysmal atrial fibrillation and PVCs, on Xarelto. States she has been having increased frequently of irregular heart beats that are very slow. Seeing her cardiologist about this and recently placed on event monitor one month ago. States this weekend had multiple recurrent episodes. Feels very weak and a little lightheaded during these episodes. Measured heart rate to be in the 50s. Occurs at rest and not brought on by exertion. No edema, chest pain, sob, DOE, orthopnea, PND. No recent illness, N/V/D, fevers, cough, urinary symptoms. Currently asymptomatic.  Past Medical History  Diagnosis Date  . Hypertension     Unspecified  . Asthma   . GERD (gastroesophageal reflux disease)   . Coronary artery disease     Native vessel  . Arrhythmia     Atrial fibrillation  . Glaucoma   . Complication of anesthesia   . PONV (postoperative nausea and vomiting)   . Holter monitor, abnormal 10/01/2015   Past Surgical History  Procedure Laterality Date  . Tubal ligation    . [place] surgery    . Cataract extraction    . Colonoscopy N/A 09/05/2015    Procedure: COLONOSCOPY;  Surgeon: [name], MD;  Location[facility];  Service: Endoscopy;  Laterality: N/A;  incomplete colonoscopy  . Retinal detachment surgery  1997  1999  . Breast lumpectomy     Family History  Problem Relation Age of Onset  . Cancer Other   . Heart failure Other   . Colon cancer Mother   . Colon cancer Maternal Grandmother   . Colon polyps Neg Hx    Social History  Substance Use Topics  . Smoking status: Former Smoker    Types:  Cigarettes    Quit date: 12/17/1976  . Smokeless tobacco: Never Used     Comment: QUIT 35 YEARS AGO  . Alcohol Use: No   OB History    No data available     Review of Systems 10/14 systems reviewed and are negative other than those stated in the HPI    Allergies  Other  Home Medications   Prior to Admission medications   Medication Sig Start Date End Date Taking? Authorizing Provider  albuterol (PROAIR HFA) 108 (90 BASE) MCG/ACT inhaler Inhale 1-2 puffs into the lungs every 6 (six) hours as needed for wheezing or shortness of breath.    Yes Historical Provider, MD  aspirin 325 MG tablet Take 325 mg by mouth every evening.    Yes Historical Provider, MD  Calcium Carbonate-Vitamin D (CALTRATE 600+D) 600-400 MG-UNIT per tablet Take 1 tablet by mouth daily.    Yes Historical Provider, MD  diltiazem (CARDIZEM) 30 MG tablet Take 1 tablet (30 mg total) by mouth as needed. Take as needed for palpitations. 08/14/13  Yes Herminio Commons, MD  docusate sodium (COLACE) 100 MG capsule Take 100 mg by mouth daily.   Yes Historical Provider, MD  Inulin (PHILLIPS FIBER GOOD PO) Take by mouth daily.   Yes Historical Provider, MD  latanoprost (XALATAN) 0.005 % ophthalmic solution Place 1 drop into both eyes at bedtime.   Yes Historical Provider, MD  lisinopril-hydrochlorothiazide (PRINZIDE,ZESTORETIC) 20-25 MG per tablet Take 1 tablet by mouth daily.     Yes Historical Provider, MD  metoprolol tartrate (LOPRESSOR) 25 MG tablet Take 1 tablet (25 mg total) by mouth 2 (two) times daily. 10/27/14  Yes Herminio Commons, MD  omeprazole (PRILOSEC) 40 MG capsule Take 40 mg by mouth daily.     Yes Historical Provider, MD  simvastatin (ZOCOR) 20 MG tablet Take 20 mg by mouth daily.     Yes Historical Provider, MD   BP 129/86 mmHg  Pulse 69  Temp(Src) 98.4 F (36.9 C) (Oral)  Resp 14  Ht 5\' 5"  (1.651 m)  Wt 169 lb (76.658 kg)  BMI 28.12 kg/m2  SpO2 95% Physical Exam Physical Exam  Nursing note  and vitals reviewed. Constitutional: Well developed, well nourished, non-toxic, and in no acute distress Head: Normocephalic and atraumatic.  Mouth/Throat: Oropharynx is clear and moist.  Neck: Normal range of motion. Neck supple.  Cardiovascular: Normal rate and regular rhythm.   Pulmonary/Chest: Effort normal and breath sounds normal.  Abdominal: Soft. There is no tenderness. There is no rebound and no guarding.  Musculoskeletal: Normal range of motion.  Neurological: Alert, no facial droop, fluent speech, moves all extremities symmetrically Skin: Skin is warm and dry.  Psychiatric: Cooperative' ED Course  Procedures (including critical care time) Labs Review Labs Reviewed  BASIC METABOLIC PANEL - Abnormal; Notable for the following:    Potassium 3.4 (*)    Glucose, Bld 110 (*)    BUN 22 (*)    All other components within normal limits  CBC  TROPONIN I  MAGNESIUM  TSH  T4, FREE    Imaging Review Dg Chest 2 View  10/29/2015  CLINICAL DATA:  History of AFib with current irregular heart rate and sensation in throat. EXAM: CHEST  2 VIEW COMPARISON:  None. FINDINGS: Lungs are adequately inflated and otherwise clear. Cardiomediastinal silhouette is within normal. Mild calcified plaque over the thoracic aorta. Mild degenerative change of the spine. IMPRESSION: No active cardiopulmonary disease. Electronically Signed   By: Marin Olp M.D.   On: 10/29/2015 16:12   I have personally reviewed and evaluated these images and lab results as part of my medical decision-making.  EKG, 10/29/2015 17:28:07  Heart rate 66 PR 179  QTc is 472 Normal sinus rhythm    MDM   Final diagnoses:  Abnormal heart rate    In short, this is a 64 year old female who presents with irregular heartbeat. She presentation is asymptomatic, well-appearing, in no acute distress. Vital signs are within normal limits. She is mentating and perfusing well. EKG was showing evidence of normal sinus rhythm  rhythm without stigmata of arrhythmia. Blood work showing no major electrolyte or metabolic derangements. No recurrence of bradycardia or symptoms while in the ED.  Discuss this patient with cardiology, Dr. Velva Harman. Recommended continued outpatient evaluation of this with her cardiologist. Discussed with patient to return if with chest pain, dyspnea, or syncope or worsening symptoms. Strict return and follow-up instructions reviewed. She expressed understanding of all discharge instructions and felt comfortable with the plan of care.    Forde Dandy, MD 10/29/15 [phone]

## 2015-10-29 NOTE — ED Notes (Signed)
EKG completed at 1544

## 2015-10-29 NOTE — ED Notes (Signed)
History AFib.  Irregular HR started.  Placed on heart monitor on 10/01/15 to 11/01/15 but pt has monitor off now.  Took off her monitor at 1515 just before coming to ER.  Denies any pain at this time.  When heart is fluttering, she feels sensation in throat.

## 2015-10-29 NOTE — ED Notes (Signed)
Pt alert & oriented x4, stable gait. Patient given discharge instructions, paperwork & prescription(s). Patient  instructed to stop at the registration desk to finish any additional paperwork. Patient verbalized understanding. Pt left department w/ no further questions. 

## 2015-10-31 ENCOUNTER — Telehealth: Payer: Self-pay | Admitting: Cardiovascular Disease

## 2015-10-31 DIAGNOSIS — I4891 Unspecified atrial fibrillation: Secondary | ICD-10-CM

## 2015-10-31 NOTE — Telephone Encounter (Signed)
Pt aware and agreeable to see EP. Will place referral and forward to schedulers. Pt also asking when to start taking Xarelto again, this was held for colonoscopy. Will forward to Dr. Bronson Ing

## 2015-10-31 NOTE — Telephone Encounter (Signed)
Will forward to Dr. Koneswaran  

## 2015-10-31 NOTE — Telephone Encounter (Signed)
I reviewed ED notes. I think she can proceed with colonoscopy. Have her see EP for a fib consult and postpone her appt with me.

## 2015-10-31 NOTE — Telephone Encounter (Signed)
Heather Cunningham called today stating that she went to the ER at [facility] on Saturday for irregular heartbeats.  She is scheduled for a colonoscopy on Tuesday. Wants to know if she should postpone the colonoscopy for tomorrow.

## 2015-11-01 ENCOUNTER — Ambulatory Visit (HOSPITAL_COMMUNITY): Payer: Medicare Other | Admitting: Anesthesiology

## 2015-11-01 ENCOUNTER — Encounter (HOSPITAL_COMMUNITY): Payer: Self-pay | Admitting: *Deleted

## 2015-11-01 ENCOUNTER — Ambulatory Visit (HOSPITAL_COMMUNITY)
Admission: RE | Admit: 2015-11-01 | Discharge: 2015-11-01 | Disposition: A | Discharge status: Home / Self Care | Payer: Medicare Other | Source: Ambulatory Visit | Attending: Gastroenterology | Admitting: Gastroenterology

## 2015-11-01 ENCOUNTER — Encounter (HOSPITAL_COMMUNITY)
Admission: RE | Disposition: A | Discharge status: Home / Self Care | Payer: Self-pay | Source: Ambulatory Visit | Attending: Gastroenterology

## 2015-11-01 DIAGNOSIS — K921 Melena: Secondary | ICD-10-CM | POA: Diagnosis not present

## 2015-11-01 DIAGNOSIS — Z7982 Long term (current) use of aspirin: Secondary | ICD-10-CM | POA: Insufficient documentation

## 2015-11-01 DIAGNOSIS — D124 Benign neoplasm of descending colon: Secondary | ICD-10-CM | POA: Insufficient documentation

## 2015-11-01 DIAGNOSIS — I10 Essential (primary) hypertension: Secondary | ICD-10-CM | POA: Insufficient documentation

## 2015-11-01 DIAGNOSIS — D122 Benign neoplasm of ascending colon: Secondary | ICD-10-CM

## 2015-11-01 DIAGNOSIS — I2510 Atherosclerotic heart disease of native coronary artery without angina pectoris: Secondary | ICD-10-CM | POA: Insufficient documentation

## 2015-11-01 DIAGNOSIS — Z800 Family history of malignant neoplasm of digestive organs: Secondary | ICD-10-CM | POA: Insufficient documentation

## 2015-11-01 DIAGNOSIS — Z87891 Personal history of nicotine dependence: Secondary | ICD-10-CM | POA: Insufficient documentation

## 2015-11-01 DIAGNOSIS — K219 Gastro-esophageal reflux disease without esophagitis: Secondary | ICD-10-CM | POA: Insufficient documentation

## 2015-11-01 DIAGNOSIS — J45909 Unspecified asthma, uncomplicated: Secondary | ICD-10-CM | POA: Diagnosis not present

## 2015-11-01 DIAGNOSIS — K5730 Diverticulosis of large intestine without perforation or abscess without bleeding: Secondary | ICD-10-CM | POA: Diagnosis not present

## 2015-11-01 DIAGNOSIS — Z79899 Other long term (current) drug therapy: Secondary | ICD-10-CM | POA: Insufficient documentation

## 2015-11-01 DIAGNOSIS — D123 Benign neoplasm of transverse colon: Secondary | ICD-10-CM | POA: Diagnosis not present

## 2015-11-01 DIAGNOSIS — Z1211 Encounter for screening for malignant neoplasm of colon: Secondary | ICD-10-CM | POA: Diagnosis not present

## 2015-11-01 DIAGNOSIS — K635 Polyp of colon: Secondary | ICD-10-CM | POA: Diagnosis not present

## 2015-11-01 DIAGNOSIS — M1991 Primary osteoarthritis, unspecified site: Secondary | ICD-10-CM | POA: Diagnosis not present

## 2015-11-01 DIAGNOSIS — K625 Hemorrhage of anus and rectum: Secondary | ICD-10-CM | POA: Diagnosis not present

## 2015-11-01 DIAGNOSIS — K648 Other hemorrhoids: Secondary | ICD-10-CM | POA: Diagnosis not present

## 2015-11-01 DIAGNOSIS — I4891 Unspecified atrial fibrillation: Secondary | ICD-10-CM | POA: Insufficient documentation

## 2015-11-01 HISTORY — PX: POLYPECTOMY: SHX5525

## 2015-11-01 HISTORY — PX: COLONOSCOPY WITH PROPOFOL: SHX5780

## 2015-11-01 SURGERY — COLONOSCOPY WITH PROPOFOL
Anesthesia: Monitor Anesthesia Care

## 2015-11-01 MED ORDER — PROPOFOL 500 MG/50ML IV EMUL
INTRAVENOUS | Status: DC | PRN
  Administered 2015-11-01: 08:00:00 via INTRAVENOUS
  Administered 2015-11-01: 150 ug/kg/min via INTRAVENOUS

## 2015-11-01 MED ORDER — MIDAZOLAM HCL 2 MG/2ML IJ SOLN
INTRAMUSCULAR | Status: AC
  Filled 2015-11-01: qty 2

## 2015-11-01 MED ORDER — SIMETHICONE 40 MG/0.6ML PO SUSP
ORAL | Status: DC | PRN
  Administered 2015-11-01: 1000 mL

## 2015-11-01 MED ORDER — MIDAZOLAM HCL 5 MG/5ML IJ SOLN
INTRAMUSCULAR | Status: DC | PRN
  Administered 2015-11-01: 2 mg via INTRAVENOUS

## 2015-11-01 MED ORDER — PROPOFOL 10 MG/ML IV BOLUS
INTRAVENOUS | Status: AC
  Filled 2015-11-01: qty 20

## 2015-11-01 MED ORDER — FENTANYL CITRATE (PF) 100 MCG/2ML IJ SOLN: 25.0000 ug | INTRAMUSCULAR | Status: DC | PRN

## 2015-11-01 MED ORDER — MIDAZOLAM HCL 2 MG/2ML IJ SOLN
1.0000 mg | INTRAMUSCULAR | Status: DC | PRN
  Administered 2015-11-01: 2 mg via INTRAVENOUS

## 2015-11-01 MED ORDER — LACTATED RINGERS IV SOLN
INTRAVENOUS | Status: DC | PRN
  Administered 2015-11-01: 07:00:00 via INTRAVENOUS

## 2015-11-01 MED ORDER — MIDAZOLAM HCL 2 MG/2ML IJ SOLN
INTRAMUSCULAR | Status: AC
  Filled 2015-11-01: qty 4

## 2015-11-01 MED ORDER — SCOPOLAMINE 1 MG/3DAYS TD PT72
1.0000 | MEDICATED_PATCH | Freq: Once | TRANSDERMAL | Status: DC
  Administered 2015-11-01: 1.5 mg via TRANSDERMAL

## 2015-11-01 MED ORDER — EPHEDRINE SULFATE 50 MG/ML IJ SOLN
INTRAMUSCULAR | Status: AC
  Filled 2015-11-01: qty 1

## 2015-11-01 MED ORDER — ONDANSETRON HCL 4 MG/2ML IJ SOLN: 4.0000 mg | Freq: Once | INTRAMUSCULAR | Status: DC | PRN

## 2015-11-01 MED ORDER — SODIUM CHLORIDE 0.9 % IJ SOLN
INTRAMUSCULAR | Status: AC
  Filled 2015-11-01: qty 10

## 2015-11-01 MED ORDER — LIDOCAINE HCL (PF) 1 % IJ SOLN
INTRAMUSCULAR | Status: AC
  Filled 2015-11-01: qty 5

## 2015-11-01 MED ORDER — SUCCINYLCHOLINE CHLORIDE 20 MG/ML IJ SOLN
INTRAMUSCULAR | Status: AC
  Filled 2015-11-01: qty 1

## 2015-11-01 MED ORDER — SCOPOLAMINE 1 MG/3DAYS TD PT72
MEDICATED_PATCH | TRANSDERMAL | Status: AC
  Filled 2015-11-01: qty 1

## 2015-11-01 MED ORDER — LACTATED RINGERS IV SOLN
INTRAVENOUS | Status: DC
  Administered 2015-11-01: 07:00:00 via INTRAVENOUS

## 2015-11-01 MED ORDER — LIDOCAINE HCL (CARDIAC) 10 MG/ML IV SOLN
INTRAVENOUS | Status: DC | PRN
  Administered 2015-11-01: 50 mg via INTRAVENOUS

## 2015-11-01 SURGICAL SUPPLY — 15 items
AMPLIFEYE LARGE BLUE (MISCELLANEOUS) ×8 IMPLANT
ELECT REM PT RETURN 9FT ADLT (ELECTROSURGICAL) ×4
ELECTRODE REM PT RTRN 9FT ADLT (ELECTROSURGICAL) ×2 IMPLANT
FLOOR PAD 36X40 (MISCELLANEOUS) ×4
FORCEPS BIOP RAD 4 LRG CAP 4 (CUTTING FORCEPS) ×4 IMPLANT
FORMALIN 10 PREFIL 20ML (MISCELLANEOUS) ×12 IMPLANT
KIT ENDO PROCEDURE PEN (KITS) ×4 IMPLANT
MANIFOLD NEPTUNE II (INSTRUMENTS) ×4 IMPLANT
PAD FLOOR 36X40 (MISCELLANEOUS) ×2 IMPLANT
SNARE ROTATE MED OVAL 20MM (MISCELLANEOUS) ×4 IMPLANT
SYR 50ML LL SCALE MARK (SYRINGE) ×4 IMPLANT
TRAP SPECIMEN MUCOUS 40CC (MISCELLANEOUS) ×4 IMPLANT
TUBING INSUFFLATOR CO2MPACT (TUBING) ×4 IMPLANT
TUBING IRRIGATION ENDOGATOR (MISCELLANEOUS) ×4 IMPLANT
WATER STERILE IRR 1000ML POUR (IV SOLUTION) ×4 IMPLANT

## 2015-11-01 NOTE — Progress Notes (Signed)
Awake. Denies pain. Sprite given to drink. Tolerated well.

## 2015-11-01 NOTE — Anesthesia Postprocedure Evaluation (Signed)
  Anesthesia Post-op Note  Patient: Heather Cunningham  Procedure(s) Performed: Procedure(s): COLONOSCOPY WITH PROPOFOL at cecum at [phone]; withdrawal time=25 minutes (N/A) POLYPECTOMY  Patient Location: PACU  Anesthesia Type:MAC  Level of Consciousness: awake, alert , oriented and patient cooperative  Airway and Oxygen Therapy: Patient Spontanous Breathing and Patient connected to face mask oxygen  Post-op Pain: none  Post-op Assessment: Post-op Vital signs reviewed, Patient's Cardiovascular Status Stable, Respiratory Function Stable, Patent Airway, No signs of Nausea or vomiting and Pain level controlled              Post-op Vital Signs: Reviewed and stable  Last Vitals:  Filed Vitals:   11/01/15 0710  BP: 124/75  Temp:   Resp: 14    Complications: No apparent anesthesia complications

## 2015-11-01 NOTE — Transfer of Care (Signed)
Immediate Anesthesia Transfer of Care Note  Patient: Heather Cunningham  Procedure(s) Performed: Procedure(s): COLONOSCOPY WITH PROPOFOL at cecum at 0748; withdrawal time=25 minutes (N/A) POLYPECTOMY  Patient Location: PACU  Anesthesia Type:MAC  Level of Consciousness: awake, alert , oriented and patient cooperative  Airway & Oxygen Therapy: Patient Spontanous Breathing and Patient connected to face mask oxygen  Post-op Assessment: Report given to RN and Post -op Vital signs reviewed and stable  Post vital signs: Reviewed and stable  Last Vitals:  Filed Vitals:   11/01/15 0710  BP: 124/75  Temp:   Resp: 14    Complications: No apparent anesthesia complications

## 2015-11-01 NOTE — Anesthesia Procedure Notes (Signed)
Procedure Name: MAC Date/Time: 11/01/2015 7:31 AM Performed by: Andree Elk,  A Pre-anesthesia Checklist: Patient identified, Timeout performed, Emergency Drugs available, Suction available and Patient being monitored Oxygen Delivery Method: Simple face mask

## 2015-11-01 NOTE — H&P (Signed)
Primary Care Physician:  [name], MD Primary Gastroenterologist:  Dr. Oneida Alar  Pre-Procedure History & Physical: HPI:  Heather Cunningham is a 64 y.o. female here for  BRBPR.  Past Medical History  Diagnosis Date  . Hypertension     Unspecified  . Asthma   . GERD (gastroesophageal reflux disease)   . Coronary artery disease     Native vessel  . Arrhythmia     Atrial fibrillation  . Glaucoma   . Complication of anesthesia   . PONV (postoperative nausea and vomiting)   . Holter monitor, abnormal 10/01/2015    Past Surgical History  Procedure Laterality Date  . Tubal ligation    . [place] surgery    . Cataract extraction    . Colonoscopy N/A 09/05/2015    Procedure: COLONOSCOPY;  Surgeon: [name], MD;  Location: [facility];  Service: Endoscopy;  Laterality: N/A;  incomplete colonoscopy  . Retinal detachment surgery  1997  1999  . Breast lumpectomy      Prior to Admission medications   Medication Sig Start Date End Date Taking? Authorizing Provider  albuterol (PROAIR HFA) 108 (90 BASE) MCG/ACT inhaler Inhale 1-2 puffs into the lungs every 6 (six) hours as needed for wheezing or shortness of breath.    Yes Historical Provider, MD  aspirin 325 MG tablet Take 325 mg by mouth every evening.    Yes Historical Provider, MD  Calcium Carbonate-Vitamin D (CALTRATE 600+D) 600-400 MG-UNIT per tablet Take 1 tablet by mouth daily.    Yes Historical Provider, MD  diltiazem (CARDIZEM) 30 MG tablet Take 1 tablet (30 mg total) by mouth as needed. Take as needed for palpitations. 08/14/13  Yes Herminio Commons, MD  docusate sodium (COLACE) 100 MG capsule Take 100 mg by mouth daily.   Yes Historical Provider, MD  Inulin (PHILLIPS FIBER GOOD PO) Take by mouth daily.   Yes Historical Provider, MD  latanoprost (XALATAN) 0.005 % ophthalmic solution Place 1 drop into both eyes at bedtime.   Yes Historical Provider, MD  lisinopril-hydrochlorothiazide (PRINZIDE,ZESTORETIC) 20-25  MG per tablet Take 1 tablet by mouth daily.     Yes Historical Provider, MD  metoprolol tartrate (LOPRESSOR) 25 MG tablet Take 1 tablet (25 mg total) by mouth 2 (two) times daily. 10/27/14  Yes Herminio Commons, MD  omeprazole (PRILOSEC) 40 MG capsule Take 40 mg by mouth daily.     Yes Historical Provider, MD  simvastatin (ZOCOR) 20 MG tablet Take 20 mg by mouth daily.     Yes Historical Provider, MD    Allergies as of 09/27/2015  . (No Known Allergies)    Family History  Problem Relation Age of Onset  . Cancer Other   . Heart failure Other   . Colon cancer Mother   . Colon cancer Maternal Grandmother   . Colon polyps Neg Hx     Social History   Social History  . Marital Status: Married    Spouse Name: [name]  . Number of Children: 1  . Years of Education: N/A   Occupational History  . [employer] Other   Social History Main Topics  . Smoking status: Former Smoker    Types: Cigarettes    Quit date: 12/17/1976  . Smokeless tobacco: Never Used     Comment: QUIT 35 YEARS AGO  . Alcohol Use: No  . Drug Use: No  . Sexual Activity: Not on file   Other  Topics Concern  . Not on file   Social History Narrative   Pt gets regular exercise. 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 WORKED FOR [employer]. KIDS: [place], [place] WAS IN THE [employer].    Review of Systems: See HPI, otherwise negative ROS   Physical Exam: BP 124/75 mmHg  Temp(Src) 97 F (36.1 C) (Oral)  Resp 14  SpO2 99% General:   Alert,  pleasant and cooperative in NAD Head:  Normocephalic and atraumatic. Neck:  Supple; Lungs:  Clear throughout to auscultation.    Heart:  Regular rate and rhythm. Abdomen:  Soft, nontender and nondistended. Normal bowel sounds, without guarding, and without rebound.   Neurologic:  Alert and  oriented x4;  grossly normal neurologically.  Impression/Plan:    BRBPR  PLAN: TCS TODAY

## 2015-11-01 NOTE — Telephone Encounter (Signed)
As soon as GI deems feasible.   LM for pt. Colonoscopy was done today.

## 2015-11-01 NOTE — Anesthesia Preprocedure Evaluation (Signed)
Anesthesia Evaluation  Patient identified by MRN, date of birth, ID band Patient awake    Reviewed: Allergy & Precautions, NPO status , Patient's Chart, lab work & pertinent test results, reviewed documented beta blocker date and time   History of Anesthesia Complications (+) PONV  Airway Mallampati: III  TM Distance: <3 FB     Dental  (+) Teeth Intact,    Pulmonary asthma , former smoker,    Pulmonary exam normal        Cardiovascular hypertension, Pt. on medications and Pt. on home beta blockers + CAD  Normal cardiovascular exam+ dysrhythmias Atrial Fibrillation      Neuro/Psych  Neuromuscular disease    GI/Hepatic GERD  Medicated and Controlled,  Endo/Other    Renal/GU      Musculoskeletal  (+) Arthritis , Osteoarthritis,    Abdominal Normal abdominal exam  (+)   Peds  Hematology   Anesthesia Other Findings   Reproductive/Obstetrics                             Anesthesia Physical Anesthesia Plan  ASA: III  Anesthesia Plan: MAC   Post-op Pain Management:    Induction: Intravenous  Airway Management Planned: Nasal Cannula  Additional Equipment:   Intra-op Plan:   Post-operative Plan:   Informed Consent: I have reviewed the patients History and Physical, chart, labs and discussed the procedure including the risks, benefits and alternatives for the proposed anesthesia with the patient or authorized representative who has indicated his/her understanding and acceptance.   Dental advisory given  Plan Discussed with: CRNA  Anesthesia Plan Comments:         Anesthesia Quick Evaluation

## 2015-11-02 ENCOUNTER — Encounter (HOSPITAL_COMMUNITY): Payer: Self-pay | Admitting: Gastroenterology

## 2015-11-02 NOTE — Op Note (Signed)
[facility] [address] [place], [address]   COLONOSCOPY PROCEDURE REPORT  PATIENT: Heather Cunningham, Heather Cunningham  MR#: [id number] BIRTHDATE: 1950-12-31 , 22  yrs. old GENDER: female ENDOSCOPIST: [name], MD REFERRED [id number] Howard, M.D. DR. Bronson Ing PROCEDURE DATE:  11/01/2015 PROCEDURE:   Colonoscopy with cold biopsy  and  with snare polypectomy INDICATIONS:hematochezia. MEDICATIONS: Monitored anesthesia care  DESCRIPTION OF PROCEDURE:    Physical exam was performed.  Informed consent was obtained from the patient after explaining the benefits, risks, and alternatives to procedure.  The patient was connected to monitor and placed in left lateral position. Continuous oxygen was provided by nasal cannula and IV medicine administered through an indwelling cannula.  After administration of sedation and rectal exam, the patients rectum was intubated and the     colonoscope was advanced under direct visualization to the ileum.  The scope was removed slowly by carefully examining the color, texture, anatomy, and integrity mucosa on the way out.  The patient was recovered in endoscopy and discharged home in satisfactory condition. Estimated blood loss is zero unless otherwise noted in this procedure report.    COLON FINDINGS: The examined terminal ileum appeared to be normal. , Three sessile polyps ranging from 5 to 52mm in size were found in the descending colon, transverse colon, and ascending colon.  A polypectomy was performed using snare cautery.  , Two sessile polyps measuring 3 mm in size were found in the transverse colon and at the hepatic flexure.  A polypectomy was performed with cold forceps.  , There was mild diverticulosis noted in the sigmoid colon with associated muscular hypertrophy and tortuosity.  , and Small internal hemorrhoids were found.  PREP QUALITY: excellent.  CECAL W/D TIME: 25       minutes COMPLICATIONS: None  ENDOSCOPIC IMPRESSION: 1.    RECTAL BLEEDING DUE TO INTERNAL HEMORRHOIDS 2.   FIVE COLON POLYPS REMOVED 3.   Mild diverticulosis in the sigmoid colon  RECOMMENDATIONS: FOLLOW A HIGH FIBER DIET. MAY START BLOOD THINNER IN 7 DAYS. PREPARATION H 2 TO 4 TIMES A DAY AS NEEDED TO RELIEVE RECTAL PRESSURE/ITCHING/BLEEDING. AWAIT BIOPSY RESULTS. Next colonoscopy in 1-3 years WITH AN OVERTUBE.    _______________________________ eSignedDanie Binder, MD Nov 24, 2015 9:07 AM   CPT CODES: ICD CODES:  The ICD and CPT codes recommended by this software are interpretations from the data that the clinical staff has captured with the software.  The verification of the translation of this report to the ICD and CPT codes and modifiers is the sole responsibility of the health care institution and practicing physician where this report was generated.  [facility] will not be held responsible for the validity of the ICD and CPT codes included on this report.  AMA assumes no liability for data contained or not contained herein. [employer] is a Designer, television/film set of the [employer].

## 2015-11-02 NOTE — Discharge Instructions (Signed)
You had 5 polyps removed. You have internal hemorrhoids and a VERY MILD CASE OF DIVERTICULOSIS IN YOUR LEFT COLON..   FOLLOW A HIGH FIBER DIET. AVOID ITEMS THAT CAUSE BLOATING & GAS. SEE INFO BELOW.  YOU CAN START BLOOD THINNER IN 7 DAYS.  USE PREPARATION H 2 TO 4 TIMES A DAY AS NEEDED TO RELIEVE RECTAL PRESSURE/ITCHING/BLEEDING.  YOUR BIOPSY RESULTS WILL BE AVAILABLE IN MY CHART AFTER Nov 17 and MY OFFICE WILL CONTACT YOU IN 10-14 DAYS WITH YOUR RESULTS.   Next colonoscopy in 1-3 years.    Colonoscopy Care After Read the instructions outlined below and refer to this sheet in the next week. These discharge instructions provide you with general information on caring for yourself after you leave the hospital. While your treatment has been planned according to the most current medical practices available, unavoidable complications occasionally occur. If you have any problems or questions after discharge, call DR. , [phone].  ACTIVITY  You may resume your regular activity, but move at a slower pace for the next 24 hours.   Take frequent rest periods for the next 24 hours.   Walking will help get rid of the air and reduce the bloated feeling in your belly (abdomen).   No driving for 24 hours (because of the medicine (anesthesia) used during the test).   You may shower.   Do not sign any important legal documents or operate any machinery for 24 hours (because of the anesthesia used during the test).    NUTRITION  Drink plenty of fluids.   You may resume your normal diet as instructed by your doctor.   Begin with a light meal and progress to your normal diet. Heavy or fried foods are harder to digest and may make you feel sick to your stomach (nauseated).   Avoid alcoholic beverages for 24 hours or as instructed.    MEDICATIONS  You may resume your normal medications.   WHAT YOU CAN EXPECT TODAY  Some feelings of bloating in the abdomen.   Passage of more gas  than usual.   Spotting of blood in your stool or on the toilet paper  .  IF YOU HAD POLYPS REMOVED DURING THE COLONOSCOPY:  Eat a soft diet IF YOU HAVE NAUSEA, BLOATING, ABDOMINAL PAIN, OR VOMITING.    FINDING OUT THE RESULTS OF YOUR TEST Not all test results are available during your visit. DR. Oneida Alar WILL CALL YOU WITHIN 14 DAYS OF YOUR PROCEDUE WITH YOUR RESULTS. Do not assume everything is normal if you have not heard from DR. , CALL HER OFFICE AT [phone].  SEEK IMMEDIATE MEDICAL ATTENTION AND CALL THE OFFICE: [phone] IF:  You have more than a spotting of blood in your stool.   Your belly is swollen (abdominal distention).   You are nauseated or vomiting.   You have a temperature over 101F.   You have abdominal pain or discomfort that is severe or gets worse throughout the day.   High-Fiber Diet A high-fiber diet changes your normal diet to include more whole grains, legumes, fruits, and vegetables. Changes in the diet involve replacing refined carbohydrates with unrefined foods. The calorie level of the diet is essentially unchanged. The Dietary Reference Intake (recommended amount) for adult males is 38 grams per day. For adult females, it is 25 grams per day. Pregnant and lactating women should consume 28 grams of fiber per day. Fiber is the intact part of a plant that is not broken down during digestion. Functional  fiber is fiber that has been isolated from the plant to provide a beneficial effect in the body. PURPOSE  Increase stool bulk.   Ease and regulate bowel movements.   Lower cholesterol.  INDICATIONS THAT YOU NEED MORE FIBER  Constipation and hemorrhoids.   Uncomplicated diverticulosis (intestine condition) and irritable bowel syndrome.   Weight management.   As a protective measure against hardening of the arteries (atherosclerosis), diabetes, and cancer.   GUIDELINES FOR INCREASING FIBER IN THE DIET  Start adding fiber to the diet  slowly. A gradual increase of about 5 more grams (2 slices of whole-wheat bread, 2 servings of most fruits or vegetables, or 1 bowl of high-fiber cereal) per day is best. Too rapid an increase in fiber may result in constipation, flatulence, and bloating.   Drink enough water and fluids to keep your urine clear or pale yellow. Water, juice, or caffeine-free drinks are recommended. Not drinking enough fluid may cause constipation.   Eat a variety of high-fiber foods rather than one type of fiber.   Try to increase your intake of fiber through using high-fiber foods rather than fiber pills or supplements that contain small amounts of fiber.   The goal is to change the types of food eaten. Do not supplement your present diet with high-fiber foods, but replace foods in your present diet.  INCLUDE A VARIETY OF FIBER SOURCES  Replace refined and processed grains with whole grains, canned fruits with fresh fruits, and incorporate other fiber sources. White rice, white breads, and most bakery goods contain little or no fiber.   Brown whole-grain rice, buckwheat oats, and many fruits and vegetables are all good sources of fiber. These include: broccoli, Brussels sprouts, cabbage, cauliflower, beets, sweet potatoes, white potatoes (skin on), carrots, tomatoes, eggplant, squash, berries, fresh fruits, and dried fruits.   Cereals appear to be the richest source of fiber. Cereal fiber is found in whole grains and bran. Bran is the fiber-rich outer coat of cereal grain, which is largely removed in refining. In whole-grain cereals, the bran remains. In breakfast cereals, the largest amount of fiber is found in those with "bran" in their names. The fiber content is sometimes indicated on the label.   You may need to include additional fruits and vegetables each day.   In baking, for 1 cup white flour, you may use the following substitutions:   1 cup whole-wheat flour minus 2 tablespoons.   1/2 cup white flour  plus 1/2 cup whole-wheat flour.   Polyps, Colon  A polyp is extra tissue that grows inside your body. Colon polyps grow in the large intestine. The large intestine, also called the colon, is part of your digestive system. It is a long, hollow tube at the end of your digestive tract where your body makes and stores stool. Most polyps are not dangerous. They are benign. This means they are not cancerous. But over time, some types of polyps can turn into cancer. Polyps that are smaller than a pea are usually not harmful. But larger polyps could someday become or may already be cancerous. To be safe, doctors remove all polyps and test them.   WHO GETS POLYPS? Anyone can get polyps, but certain people are more likely than others. You may have a greater chance of getting polyps if:  You are over 50.   You have had polyps before.   Someone in your family has had polyps.   Someone in your family has had cancer of the large  intestine.   Find out if someone in your family has had polyps. You may also be more likely to get polyps if you:   Eat a lot of fatty foods   Smoke   Drink alcohol   Do not exercise  Eat too much   TREATMENT  The caregiver will remove the polyp during sigmoidoscopy or colonoscopy.    PREVENTION There is not one sure way to prevent polyps. You might be able to lower your risk of getting them if you:  Eat more fruits and vegetables and less fatty food.   Do not smoke.   Avoid alcohol.   Exercise every day.   Lose weight if you are overweight.   Eating more calcium and folate can also lower your risk of getting polyps. Some foods that are rich in calcium are milk, cheese, and broccoli. Some foods that are rich in folate are chickpeas, kidney beans, and spinach.   Hemorrhoids Hemorrhoids are dilated (enlarged) veins around the rectum. Sometimes clots will form in the veins. This makes them swollen and painful. These are called thrombosed hemorrhoids. Causes of  hemorrhoids include:  Constipation.   Straining to have a bowel movement.   HEAVY LIFTING HOME CARE INSTRUCTIONS  Eat a well balanced diet and drink 6 to 8 glasses of water every day to avoid constipation. You may also use a bulk laxative.   Avoid straining to have bowel movements.   Keep anal area dry and clean.   Do not use a donut shaped pillow or sit on the toilet for long periods. This increases blood pooling and pain.   Move your bowels when your body has the urge; this will require less straining and will decrease pain and pressure.

## 2015-11-03 ENCOUNTER — Telehealth: Payer: Self-pay | Admitting: *Deleted

## 2015-11-03 NOTE — Telephone Encounter (Signed)
-----   Message from Herminio Commons, MD sent at 10/31/2015  5:10 PM EST ----- As soon as GI deems feasible.  ----- Message -----    From: Massie Maroon, CMA    Sent: 10/31/2015   4:48 PM      To: Herminio Commons, MD  When do you want this pt to resume Xarelto?

## 2015-11-03 NOTE — Telephone Encounter (Signed)
Pt says GI is holding Xarelto pending biopsy report. Pt also says she mailed back monitor yesterday.

## 2015-11-04 ENCOUNTER — Telehealth: Payer: Self-pay | Admitting: Gastroenterology

## 2015-11-04 NOTE — Telephone Encounter (Signed)
Pt is aware of results. 

## 2015-11-04 NOTE — Telephone Encounter (Signed)
REMINDER IN EPIC °

## 2015-11-04 NOTE — Telephone Encounter (Signed)
Please call pt. She had simple adenomas removed from her colon.    FOLLOW A HIGH FIBER DIET. AVOID ITEMS THAT CAUSE BLOATING & GAS.   MAY START BLOOD THINNER AFTER WED NOV 23.  USE PREPARATION H 2 TO 4 TIMES A DAY AS NEEDED TO RELIEVE RECTAL PRESSURE/ITCHING/BLEEDING.  Next colonoscopy in 3 years.

## 2015-11-14 ENCOUNTER — Telehealth: Payer: Self-pay | Admitting: Cardiovascular Disease

## 2015-11-14 NOTE — Telephone Encounter (Signed)
Notes Recorded by Laurine Blazer, LPN on [id number] at 2:03 PM Patient notified. Has Dr. Rayann Heman appt for 11/16/15 & Dr. Bronson Ing follow up already scheduled for 12/01/15. Copy fwd to pmd.  Notes Recorded by Herminio Commons, MD on 11/09/2015 at 5:08 PM Only extra beats (PAC's, PVC's) seen, no atrial fibrillation. Symptoms correlated with this.

## 2015-11-14 NOTE — Telephone Encounter (Signed)
Mrs. Schoenborn is calling to see if we have test results back on her 30 day monitor.

## 2015-11-16 ENCOUNTER — Ambulatory Visit (INDEPENDENT_AMBULATORY_CARE_PROVIDER_SITE_OTHER): Payer: Medicare Other | Admitting: Internal Medicine

## 2015-11-16 ENCOUNTER — Encounter: Payer: Self-pay | Admitting: Internal Medicine

## 2015-11-16 VITALS — BP 126/74 | HR 70 | Ht 65.0 in | Wt 169.8 lb

## 2015-11-16 DIAGNOSIS — I480 Paroxysmal atrial fibrillation: Secondary | ICD-10-CM

## 2015-11-16 DIAGNOSIS — R079 Chest pain, unspecified: Secondary | ICD-10-CM | POA: Diagnosis not present

## 2015-11-16 DIAGNOSIS — I493 Ventricular premature depolarization: Secondary | ICD-10-CM

## 2015-11-16 DIAGNOSIS — I10 Essential (primary) hypertension: Secondary | ICD-10-CM | POA: Diagnosis not present

## 2015-11-16 DIAGNOSIS — R002 Palpitations: Secondary | ICD-10-CM | POA: Diagnosis not present

## 2015-11-16 MED ORDER — LISINOPRIL 20 MG PO TABS: 20.0000 mg | ORAL_TABLET | Freq: Every day | ORAL | Status: DC

## 2015-11-16 NOTE — Patient Instructions (Addendum)
Medication Instructions:  Your physician has recommended you make the following change in your medication:  1) Stop Lisinopri/ HCTZ 2) Start Lisinopril 20 mg daily    Labwork: None ordered   Testing/Procedures: Your physician has requested that you have an echocardiogram. Echocardiography is a painless test that uses sound waves to create images of your heart. It provides your doctor with information about the size and shape of your heart and how well your heart's chambers and valves are working. This procedure takes approximately one hour. There are no restrictions for this procedure.  Your physician has requested that you have a lexiscan myoview. For further information please visit [email or web address]. Please follow instruction sheet, as given.    Follow-Up: Your physician recommends that you schedule a follow-up appointment as needed with Dr Rayann Heman and as scheduled with Dr Bronson Ing   Any Other Special Instructions Will Be Listed Below (If Applicable).     If you need a refill on your cardiac medications before your next appointment, please call your pharmacy.

## 2015-11-17 DIAGNOSIS — I493 Ventricular premature depolarization: Secondary | ICD-10-CM | POA: Insufficient documentation

## 2015-11-17 DIAGNOSIS — R002 Palpitations: Secondary | ICD-10-CM | POA: Insufficient documentation

## 2015-11-17 NOTE — Progress Notes (Signed)
Electrophysiology Office Note   Date:  11/17/2015   ID:  Heather Cunningham, DOB 04-12-1951, MRN [id number]  PCP:  [name], MD  Cardiologist:  Dr Bronson Ing Primary Electrophysiologist: Thompson Grayer, MD    Chief Complaint  Patient presents with  . PAF     History of Present Illness: Heather Cunningham is a 64 y.o. female who presents today for electrophysiology evaluation.   The patient has occasional palpitations.Though she has a h/o afib, she feels that this has actually been well controlled.  She recently presented to Dr Bronson Ing with complaints of palpitations.  She wore an event monitor which revealed PACs and PVCs but no afib or sustained arrhythmias.  She denies sustained palpitations but seems to be having "waves" of pacs/pvcs.  She has also been having concomitant chest and back pain, which is made worse by holding her arms up when she's cooking or ironing.  She has occasional SOB.  She also has rare dizziness.  Today, she denies symptoms of orthopnea, PND, lower extremity edema, claudication, presyncope, syncope, bleeding, or neurologic sequela. The patient is tolerating medications without difficulties and is otherwise without complaint today.    Past Medical History  Diagnosis Date  . Hypertension   . Asthma   . GERD (gastroesophageal reflux disease)   . Glaucoma   . Complication of anesthesia   . PONV (postoperative nausea and vomiting)   . Paroxysmal atrial fibrillation (HCC)   . Premature ventricular contraction    Past Surgical History  Procedure Laterality Date  . Tubal ligation    . [place] surgery    . Cataract extraction    . Colonoscopy N/A 09/05/2015    Procedure: COLONOSCOPY;  Surgeon: [name], MD;  [facility];  Service: Endoscopy;  Laterality: N/A;  incomplete colonoscopy  . Retinal detachment surgery  1997  1999  . Breast lumpectomy    . Colonoscopy with propofol N/A 11/01/2015    Procedure: COLONOSCOPY WITH PROPOFOL at  cecum at 0748; withdrawal time=25 minutes;  Surgeon: [name], MD;  Location: [facility];  Service: Endoscopy;  Laterality: N/A;  . Polypectomy  11/01/2015    Procedure: POLYPECTOMY;  Surgeon: [name], MD;  Location: [facility];  Service: Endoscopy;;     Current Outpatient Prescriptions  Medication Sig Dispense Refill  . albuterol (PROAIR HFA) 108 (90 BASE) MCG/ACT inhaler Inhale 1-2 puffs into the lungs every 6 (six) hours as needed for wheezing or shortness of breath.     . Calcium Carbonate-Vitamin D (CALTRATE 600+D) 600-400 MG-UNIT per tablet Take 1 tablet by mouth daily.     Marland Kitchen diltiazem (CARDIZEM) 30 MG tablet Take 30 mg by mouth daily as needed (Palpitations).    . docusate sodium (COLACE) 100 MG capsule Take 100 mg by mouth daily.    . Inulin (PHILLIPS FIBER GOOD PO) Take 1 capsule by mouth daily.     Marland Kitchen latanoprost (XALATAN) 0.005 % ophthalmic solution Place 1 drop into both eyes at bedtime.    . metoprolol tartrate (LOPRESSOR) 25 MG tablet Take 1 tablet (25 mg total) by mouth 2 (two) times daily. 180 tablet 3  . omeprazole (PRILOSEC) 40 MG capsule Take 40 mg by mouth every other day.     . rivaroxaban (XARELTO) 20 MG TABS tablet Take 20 mg by mouth daily with supper.    . simvastatin (ZOCOR) 20 MG tablet Take 20 mg by mouth daily.      Marland Kitchen  lisinopril (PRINIVIL,ZESTRIL) 20 MG tablet Take 1 tablet (20 mg total) by mouth daily. 90 tablet 3   No current facility-administered medications for this visit.    Allergies:   Other   Social History:  The patient  reports that she quit smoking about 38 years ago. Her smoking use included Cigarettes. She has never used smokeless tobacco. She reports that she does not drink alcohol or use illicit drugs.   Family History:  The patient's  family history includes Cancer in her other; Colon cancer in her maternal grandmother and mother; Heart failure in her other. There is no history of Colon polyps.    ROS:  Please see the history of present  illness.   All other systems are reviewed and negative.    PHYSICAL EXAM: VS:  BP 126/74 mmHg  Pulse 70  Ht 5\' 5"  (1.651 m)  Wt 169 lb 12.8 oz (77.021 kg)  BMI 28.26 kg/m2 , BMI Body mass index is 28.26 kg/(m^2). GEN: Well nourished, well developed, in no acute distress HEENT: normal Neck: no JVD, carotid bruits, or masses Cardiac: RRR; no murmurs, rubs, or gallops,no edema  Respiratory:  clear to auscultation bilaterally, normal work of breathing GI: soft, nontender, nondistended, + BS MS: no deformity or atrophy Skin: warm and dry  Neuro:  Strength and sensation are intact Psych: euthymic mood, full affect  EKG:  EKG is ordered today. The ekg ordered today shows sinus rhythm   Recent Labs: 10/29/2015: BUN 22*; Creatinine, Ser 0.69; Hemoglobin 13.3; Magnesium 1.9; Platelets 266; Potassium 3.4*; Sodium 141; TSH 1.835    Lipid Panel  No results found for: CHOL, TRIG, HDL, CHOLHDL, VLDL, LDLCALC, LDLDIRECT   Wt Readings from Last 3 Encounters:  11/16/15 169 lb 12.8 oz (77.021 kg)  10/29/15 169 lb (76.658 kg)  09/30/15 170 lb (77.111 kg)      Other studies Reviewed: Additional studies/ records that were reviewed today include: Dr Court Joy notes, recent event monitor   ASSESSMENT AND PLAN:  1.  Palpitations Recent event monitor suggests pacs and pvcs as the cause Given infrequent nature, I am reluctant to start any new medicine at this time. Recent labs reveal hypokalemia.  HCTZ may be causing dehydration/ hypokalemia which could contribute to symptoms.  I will therefore stop hctz. Echo  If echo/ myoview are normal, regular exercise is encouraged  2. afib Appears to be currently well controlled Appropriately anticoagulated with xarelto Echo as above  3. htn Stop hctz  Continue to follow BP  4. Chest/ back discomfort Obtain myoview to evaluate for ischemia as the cause   Follow-up with Dr Bronson Ing  I will see as needed going forward. If afib becomes  more prominent, we may consider AAD therapy at that time.  For now, she is clear that she would like to avoid this.  Current medicines are reviewed at length with the patient today.   The patient does not have other concerns regarding her medicines.  The following changes were made today:  none  Labs/ tests ordered today include:  Orders Placed This Encounter  Procedures  . Myocardial Perfusion Imaging  . EKG 12-Lead  . ECHOCARDIOGRAM COMPLETE     Signed, Thompson Grayer, MD  11/17/2015 8:59 PM     [place] [address] [phone] (office) [phone] (fax)

## 2015-11-30 ENCOUNTER — Other Ambulatory Visit: Payer: Self-pay

## 2015-11-30 ENCOUNTER — Ambulatory Visit (HOSPITAL_BASED_OUTPATIENT_CLINIC_OR_DEPARTMENT_OTHER): Payer: Medicare Other

## 2015-11-30 ENCOUNTER — Ambulatory Visit (HOSPITAL_COMMUNITY): Payer: Medicare Other | Attending: Cardiology

## 2015-11-30 DIAGNOSIS — I517 Cardiomegaly: Secondary | ICD-10-CM | POA: Diagnosis not present

## 2015-11-30 DIAGNOSIS — R079 Chest pain, unspecified: Secondary | ICD-10-CM | POA: Insufficient documentation

## 2015-11-30 DIAGNOSIS — Z87891 Personal history of nicotine dependence: Secondary | ICD-10-CM | POA: Insufficient documentation

## 2015-11-30 DIAGNOSIS — I480 Paroxysmal atrial fibrillation: Secondary | ICD-10-CM | POA: Insufficient documentation

## 2015-11-30 DIAGNOSIS — I10 Essential (primary) hypertension: Secondary | ICD-10-CM | POA: Diagnosis not present

## 2015-11-30 DIAGNOSIS — I071 Rheumatic tricuspid insufficiency: Secondary | ICD-10-CM | POA: Insufficient documentation

## 2015-11-30 LAB — MYOCARDIAL PERFUSION IMAGING
LV dias vol: 57 mL
LV sys vol: 22 mL
Peak HR: 88 {beats}/min
RATE: 0.22
Rest HR: 60 {beats}/min
SDS: 3
SRS: 6
SSS: 7
TID: 0.93

## 2015-11-30 MED ORDER — TECHNETIUM TC 99M SESTAMIBI GENERIC - CARDIOLITE
32.3000 | Freq: Once | INTRAVENOUS | Status: AC | PRN
  Administered 2015-11-30: 32.3 via INTRAVENOUS

## 2015-11-30 MED ORDER — TECHNETIUM TC 99M SESTAMIBI GENERIC - CARDIOLITE
10.8000 | Freq: Once | INTRAVENOUS | Status: AC | PRN
  Administered 2015-11-30: 11 via INTRAVENOUS

## 2015-11-30 MED ORDER — REGADENOSON 0.4 MG/5ML IV SOLN
0.4000 mg | Freq: Once | INTRAVENOUS | Status: AC
  Administered 2015-11-30: 0.4 mg via INTRAVENOUS

## 2015-12-01 ENCOUNTER — Encounter (HOSPITAL_COMMUNITY): Payer: Self-pay

## 2015-12-01 ENCOUNTER — Emergency Department (HOSPITAL_COMMUNITY)
Admission: EM | Admit: 2015-12-01 | Discharge: 2015-12-01 | Disposition: A | Discharge status: Home / Self Care | Payer: Medicare Other | Attending: Emergency Medicine | Admitting: Emergency Medicine

## 2015-12-01 ENCOUNTER — Ambulatory Visit (INDEPENDENT_AMBULATORY_CARE_PROVIDER_SITE_OTHER): Payer: Medicare Other | Admitting: Cardiovascular Disease

## 2015-12-01 ENCOUNTER — Encounter: Payer: Self-pay | Admitting: Cardiovascular Disease

## 2015-12-01 ENCOUNTER — Other Ambulatory Visit: Payer: Self-pay

## 2015-12-01 VITALS — BP 120/84 | HR 73 | Ht 65.0 in | Wt 168.0 lb

## 2015-12-01 DIAGNOSIS — Z79899 Other long term (current) drug therapy: Secondary | ICD-10-CM | POA: Diagnosis not present

## 2015-12-01 DIAGNOSIS — I4891 Unspecified atrial fibrillation: Secondary | ICD-10-CM

## 2015-12-01 DIAGNOSIS — Z9289 Personal history of other medical treatment: Secondary | ICD-10-CM

## 2015-12-01 DIAGNOSIS — Z87898 Personal history of other specified conditions: Secondary | ICD-10-CM

## 2015-12-01 DIAGNOSIS — Z87891 Personal history of nicotine dependence: Secondary | ICD-10-CM | POA: Insufficient documentation

## 2015-12-01 DIAGNOSIS — J45909 Unspecified asthma, uncomplicated: Secondary | ICD-10-CM | POA: Diagnosis not present

## 2015-12-01 DIAGNOSIS — Z8669 Personal history of other diseases of the nervous system and sense organs: Secondary | ICD-10-CM | POA: Insufficient documentation

## 2015-12-01 DIAGNOSIS — R002 Palpitations: Secondary | ICD-10-CM | POA: Diagnosis not present

## 2015-12-01 DIAGNOSIS — K219 Gastro-esophageal reflux disease without esophagitis: Secondary | ICD-10-CM | POA: Diagnosis not present

## 2015-12-01 DIAGNOSIS — I480 Paroxysmal atrial fibrillation: Secondary | ICD-10-CM | POA: Insufficient documentation

## 2015-12-01 DIAGNOSIS — Z7901 Long term (current) use of anticoagulants: Secondary | ICD-10-CM | POA: Diagnosis not present

## 2015-12-01 DIAGNOSIS — I10 Essential (primary) hypertension: Secondary | ICD-10-CM | POA: Diagnosis not present

## 2015-12-01 DIAGNOSIS — R079 Chest pain, unspecified: Secondary | ICD-10-CM | POA: Diagnosis present

## 2015-12-01 LAB — CBC WITH DIFFERENTIAL/PLATELET
Basophils Absolute: 0 10*3/uL (ref 0.0–0.1)
Basophils Relative: 0 %
Eosinophils Absolute: 0.3 10*3/uL (ref 0.0–0.7)
Eosinophils Relative: 3 %
HCT: 42.3 % (ref 36.0–46.0)
Hemoglobin: 14.5 g/dL (ref 12.0–15.0)
Lymphocytes Relative: 41 %
Lymphs Abs: 4.1 10*3/uL — ABNORMAL HIGH (ref 0.7–4.0)
MCH: 31.1 pg (ref 26.0–34.0)
MCHC: 34.3 g/dL (ref 30.0–36.0)
MCV: 90.8 fL (ref 78.0–100.0)
Monocytes Absolute: 1.2 10*3/uL — ABNORMAL HIGH (ref 0.1–1.0)
Monocytes Relative: 12 %
Neutro Abs: 4.4 10*3/uL (ref 1.7–7.7)
Neutrophils Relative %: 44 %
Platelets: 291 10*3/uL (ref 150–400)
RBC: 4.66 MIL/uL (ref 3.87–5.11)
RDW: 12.8 % (ref 11.5–15.5)
WBC: 10.1 10*3/uL (ref 4.0–10.5)

## 2015-12-01 LAB — BASIC METABOLIC PANEL
Anion gap: 11 (ref 5–15)
BUN: 19 mg/dL (ref 6–20)
CO2: 24 mmol/L (ref 22–32)
Calcium: 9.6 mg/dL (ref 8.9–10.3)
Chloride: 102 mmol/L (ref 101–111)
Creatinine, Ser: 0.6 mg/dL (ref 0.44–1.00)
GFR calc Af Amer: >60 mL/min (ref >60)
GFR calc non Af Amer: >60 mL/min (ref >60)
Glucose, Bld: 140 mg/dL — ABNORMAL HIGH (ref 65–99)
Potassium: 3.5 mmol/L (ref 3.5–5.1)
Sodium: 137 mmol/L (ref 135–145)

## 2015-12-01 LAB — TROPONIN I: Troponin I: <0.03 ng/mL (ref <0.031)

## 2015-12-01 MED ORDER — METOPROLOL TARTRATE 50 MG PO TABS: 50.0000 mg | ORAL_TABLET | Freq: Two times a day (BID) | ORAL | Status: DC

## 2015-12-01 MED ORDER — DILTIAZEM LOAD VIA INFUSION: 10.0000 mg | Freq: Once | INTRAVENOUS | Status: DC

## 2015-12-01 MED ORDER — DILTIAZEM HCL 100 MG IV SOLR
5.0000 mg/h | INTRAVENOUS | Status: DC
  Administered 2015-12-01: 5 mg/h via INTRAVENOUS

## 2015-12-01 MED ORDER — DILTIAZEM HCL 100 MG IV SOLR
INTRAVENOUS | Status: DC
Start: 2015-12-01 — End: 2015-12-01
  Filled 2015-12-01: qty 100

## 2015-12-01 MED ORDER — DEXTROSE 5 % IV SOLN: 5.0000 mg/h | INTRAVENOUS | Status: DC

## 2015-12-01 MED ORDER — DILTIAZEM HCL 30 MG PO TABS
30.0000 mg | ORAL_TABLET | Freq: Once | ORAL | Status: AC
  Administered 2015-12-01: 30 mg via ORAL
  Filled 2015-12-01: qty 1

## 2015-12-01 MED ORDER — DILTIAZEM LOAD VIA INFUSION
7.5000 mg | Freq: Once | INTRAVENOUS | Status: AC
  Administered 2015-12-01: 7.5 mg via INTRAVENOUS

## 2015-12-01 MED ORDER — POTASSIUM CHLORIDE CRYS ER 20 MEQ PO TBCR
40.0000 meq | EXTENDED_RELEASE_TABLET | Freq: Once | ORAL | Status: AC
  Administered 2015-12-01: 40 meq via ORAL
  Filled 2015-12-01: qty 2

## 2015-12-01 NOTE — ED Notes (Signed)
Patient assisted to restroom, tolerated well 

## 2015-12-01 NOTE — ED Notes (Signed)
Pt states she awoke to go to the bathroom and felt like her heart was racing.  Pt denies complaints other than some mild dizziness

## 2015-12-01 NOTE — Progress Notes (Signed)
Patient ID: Heather Cunningham, female   DOB: 1951/12/11, 64 y.o.   MRN: [id number]      SUBJECTIVE: The patient presents for follow-up of atrial fibrillation. She was evaluated in the ED early this morning for lightheadedness. She took 30 mg of diltiazem with no improvement. ECG showed atrial fibrillation, heart rate 113 bpm. Her heart rate was noted to be very labile, ranging from 70-150 bpm.  CBC was normal.troponin also normal. Potassium was low normal at 3.5 and she was given supplementation. Dr. Rayann Heman previously stopped her hydrochlorothiazide.  She saw Dr. Rayann Heman earlier this month and given the fact that at that time her atrial fibrillation seemed to be well controlled, he recommended watchful waiting.  She underwent nuclear stress testing which was normal on 11/30/15.She also underwent a normal echocardiogram, LVEF 60-65%, with normal left atrial size.  She still has palpitations this morning. I auscultated her and her heart rate is currently 100 bpm. She has not taken her morning dose of metoprolol. She leaves for [place] tomorrow morning at 5 AM and is driving there.   Review of Systems: As per "subjective", otherwise negative.  Allergies  Allergen Reactions  . Other     CANNOT RECEIVE AN MRI DUE TO METAL BAND (RIGHT EYE) DUE TO DETATCHED RETINA     Current Outpatient Prescriptions  Medication Sig Dispense Refill  . albuterol (PROAIR HFA) 108 (90 BASE) MCG/ACT inhaler Inhale 1-2 puffs into the lungs every 6 (six) hours as needed for wheezing or shortness of breath.     . Calcium Carbonate-Vitamin D (CALTRATE 600+D) 600-400 MG-UNIT per tablet Take 1 tablet by mouth daily.     Marland Kitchen diltiazem (CARDIZEM) 30 MG tablet Take 30 mg by mouth daily as needed (Palpitations).    . docusate sodium (COLACE) 100 MG capsule Take 100 mg by mouth daily.    . Inulin (PHILLIPS FIBER GOOD PO) Take 1 capsule by mouth daily.     Marland Kitchen latanoprost (XALATAN) 0.005 % ophthalmic solution Place 1 drop into both eyes  at bedtime.    Marland Kitchen lisinopril (PRINIVIL,ZESTRIL) 20 MG tablet Take 1 tablet (20 mg total) by mouth daily. 90 tablet 3  . metoprolol tartrate (LOPRESSOR) 25 MG tablet Take 1 tablet (25 mg total) by mouth 2 (two) times daily. 180 tablet 3  . omeprazole (PRILOSEC) 40 MG capsule Take 40 mg by mouth every other day.     . rivaroxaban (XARELTO) 20 MG TABS tablet Take 20 mg by mouth daily with supper.    . simvastatin (ZOCOR) 20 MG tablet Take 20 mg by mouth daily.       No current facility-administered medications for this visit.    Past Medical History  Diagnosis Date  . Hypertension   . Asthma   . GERD (gastroesophageal reflux disease)   . Glaucoma   . Complication of anesthesia   . PONV (postoperative nausea and vomiting)   . Paroxysmal atrial fibrillation (HCC)   . Premature ventricular contraction     Past Surgical History  Procedure Laterality Date  . Tubal ligation    . [place] surgery    . Cataract extraction    . Colonoscopy N/A 09/05/2015    Procedure: COLONOSCOPY;  Surgeon: [name], MD;  [facility];  Service: Endoscopy;  Laterality: N/A;  incomplete colonoscopy  . Retinal detachment surgery  1997  1999  . Breast lumpectomy    . Colonoscopy with propofol N/A 11/01/2015  Procedure: COLONOSCOPY WITH PROPOFOL at cecum at 0748; withdrawal time=25 minutes;  Surgeon: [name], MD;  Location: [facility];  Service: Endoscopy;  Laterality: N/A;  . Polypectomy  11/01/2015    Procedure: POLYPECTOMY;  Surgeon: [name], MD;  Location: [facility];  Service: Endoscopy;;    Social History   Social History  . Marital Status: Married    Spouse Name: [name]  . Number of Children: 1  . Years of Education: N/A   Occupational History  . [employer] Other   Social History Main Topics  . Smoking status: Former Smoker -- 1.00 packs/day for 10 years    Types: Cigarettes    Start date: 12/17/1966    Quit date: 12/17/1976  .  Smokeless tobacco: Never Used     Comment: QUIT 35 YEARS AGO  . Alcohol Use: No  . Drug Use: No  . Sexual Activity: Not on file   Other Topics Concern  . Not on file   Social History Narrative   Pt gets regular exercise. 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 [employer]. KIDS: [place], [place] WAS IN [employer].     Filed Vitals:   12/01/15 1009  BP: 120/84  Pulse: 73  Height: 5\' 5"  (1.651 m)  Weight: 168 lb (76.204 kg)    PHYSICAL EXAM General: NAD HEENT: Normal. Neck: No JVD, no thyromegaly. Lungs: Clear to auscultation bilaterally with normal respiratory effort. CV: Tachycardic, irregular rhythm, normal S1/S2, no S3, no murmur. No pretibial or periankle edema.   Abdomen: Soft, nontender, no distention.  Neurologic: Alert and oriented x 3.  Psych: Normal affect. Skin: Normal. Musculoskeletal: Normal range of motion, no gross deformities. Extremities: No clubbing or cyanosis.   ECG: Most recent ECG reviewed.      ASSESSMENT AND PLAN: 1. Atrial fibrillation with RVR (paroxysmal): Will increase metoprolol to 50 mg bid and have her take it now. We discussed having her undergo elective cardioversion later today as well. I have asked her to call me tomorrow morning to update me on her symptoms.  Anticoagulated with Xarelto.  2. Essential HTN: Controlled. No changes.  Dispo: f/u with Gerrianne Scale PA-C after she returns from [facility].   Time spent: 40 minutes, of which greater than 50% was spent reviewing symptoms, relevant blood tests and studies, and discussing management plan with the patient.   Kate Sable, M.D., F.A.C.C.

## 2015-12-01 NOTE — ED Provider Notes (Signed)
CSN: [id number]     Arrival date & time 12/01/15  0414 History   First MD Initiated Contact with Patient 12/01/15 [phone]     Chief Complaint  Patient presents with  . Tachycardia     (Consider location/radiation/quality/duration/timing/severity/associated sxs/prior Treatment) The history is provided by the patient.  64 year old female with a history of paroxysmal atrial fibrillation suspects she is back in atrial fibrillation. She woke up at 1:30 to urinate, and noticed she was lightheaded. She suspected she was in atrial fibrillation, and took a dose of diltiazem 30 mg with no improvement. She has had a vague chest discomfort, but no dyspnea or nausea or diaphoresis. She denies any recent caffeine excess or use of cough or cold medications. Of note, she states that she has been very sensitive to diltiazem in the past.  Past Medical History  Diagnosis Date  . Hypertension   . Asthma   . GERD (gastroesophageal reflux disease)   . Glaucoma   . Complication of anesthesia   . PONV (postoperative nausea and vomiting)   . Paroxysmal atrial fibrillation (HCC)   . Premature ventricular contraction    Past Surgical History  Procedure Laterality Date  . Tubal ligation    . [place] surgery    . Cataract extraction    . Colonoscopy N/A 09/05/2015    Procedure: COLONOSCOPY;  Surgeon: [name], MD;  [facility];  Service: Endoscopy;  Laterality: N/A;  incomplete colonoscopy  . Retinal detachment surgery  1997  1999  . Breast lumpectomy    . Colonoscopy with propofol N/A 11/01/2015    Procedure: COLONOSCOPY WITH PROPOFOL at cecum at 0748; withdrawal time=25 minutes;  Surgeon: [name], MD;  Location: [facility];  Service: Endoscopy;  Laterality: N/A;  . Polypectomy  11/01/2015    Procedure: POLYPECTOMY;  Surgeon: [name], MD;  Location: [facility];  Service: Endoscopy;;   Family History  Problem Relation Age of Onset  . Cancer Other   . Heart failure Other    . Colon cancer Mother   . Colon cancer Maternal Grandmother   . Colon polyps Neg Hx    Social History  Substance Use Topics  . Smoking status: Former Smoker    Types: Cigarettes    Quit date: 12/17/1976  . Smokeless tobacco: Never Used     Comment: QUIT 35 YEARS AGO  . Alcohol Use: No   OB History    No data available     Review of Systems  All other systems reviewed and are negative.     Allergies  Other  Home Medications   Prior to Admission medications   Medication Sig Start Date End Date Taking? Authorizing Provider  albuterol (PROAIR HFA) 108 (90 BASE) MCG/ACT inhaler Inhale 1-2 puffs into the lungs every 6 (six) hours as needed for wheezing or shortness of breath.     Historical Provider, MD  Calcium Carbonate-Vitamin D (CALTRATE 600+D) 600-400 MG-UNIT per tablet Take 1 tablet by mouth daily.     Historical Provider, MD  diltiazem (CARDIZEM) 30 MG tablet Take 30 mg by mouth daily as needed (Palpitations).    Historical Provider, MD  docusate sodium (COLACE) 100 MG capsule Take 100 mg by mouth daily.    Historical Provider, MD  Inulin (PHILLIPS FIBER GOOD PO) Take 1 capsule by mouth daily.     Historical Provider, MD  latanoprost (XALATAN) 0.005 % ophthalmic solution Place 1 drop into both  eyes at bedtime.    Historical Provider, MD  lisinopril (PRINIVIL,ZESTRIL) 20 MG tablet Take 1 tablet (20 mg total) by mouth daily. 11/16/15   Thompson Grayer, MD  metoprolol tartrate (LOPRESSOR) 25 MG tablet Take 1 tablet (25 mg total) by mouth 2 (two) times daily. 10/27/14   Herminio Commons, MD  omeprazole (PRILOSEC) 40 MG capsule Take 40 mg by mouth every other day.     Historical Provider, MD  rivaroxaban (XARELTO) 20 MG TABS tablet Take 20 mg by mouth daily with supper.    Historical Provider, MD  simvastatin (ZOCOR) 20 MG tablet Take 20 mg by mouth daily.      Historical Provider, MD   BP 139/105 mmHg  Pulse 120  Temp(Src) 97.9 F (36.6 C) (Oral)  Resp 18  Ht 5\' 5"   (1.651 m)  Wt 166 lb (75.297 kg)  BMI 27.62 kg/m2  SpO2 96% Physical Exam  Nursing note and vitals reviewed.  64 year old female, resting comfortably and in no acute distress. Vital signs are significant for tachycardia and hypertension. Oxygen saturation is 96%, which is normal. Head is normocephalic and atraumatic. PERRLA, EOMI. Oropharynx is clear. Neck is nontender and supple without adenopathy or JVD. Back is nontender and there is no CVA tenderness. Lungs are clear without rales, wheezes, or rhonchi. Chest is nontender. Heart has an irregularly ittegular rhythm without murmur. Abdomen is soft, flat, nontender without masses or hepatosplenomegaly and peristalsis is normoactive. Extremities have no cyanosis or edema, full range of motion is present. Skin is warm and dry without rash. Neurologic: Mental status is normal, cranial nerves are intact, there are no motor or sensory deficits.  ED Course  Procedures (including critical care time) Labs Review Results for orders placed or performed during the hospital encounter of 123456  Basic metabolic panel  Result Value Ref Range   Sodium 137 135 - 145 mmol/L   Potassium 3.5 3.5 - 5.1 mmol/L   Chloride 102 101 - 111 mmol/L   CO2 24 22 - 32 mmol/L   Glucose, Bld 140 (H) 65 - 99 mg/dL   BUN 19 6 - 20 mg/dL   Creatinine, Ser 0.60 0.44 - 1.00 mg/dL   Calcium 9.6 8.9 - 10.3 mg/dL   GFR calc non Af Amer >60 >60 mL/min   GFR calc Af Amer >60 >60 mL/min   Anion gap 11 5 - 15  Troponin I  Result Value Ref Range   Troponin I <0.03 <0.031 ng/mL  CBC with Differential  Result Value Ref Range   WBC 10.1 4.0 - 10.5 K/uL   RBC 4.66 3.87 - 5.11 MIL/uL   Hemoglobin 14.5 12.0 - 15.0 g/dL   HCT 42.3 36.0 - 46.0 %   MCV 90.8 78.0 - 100.0 fL   MCH 31.1 26.0 - 34.0 pg   MCHC 34.3 30.0 - 36.0 g/dL   RDW 12.8 11.5 - 15.5 %   Platelets 291 150 - 400 K/uL   Neutrophils Relative % 44 %   Neutro Abs 4.4 1.7 - 7.7 K/uL   Lymphocytes Relative  41 %   Lymphs Abs 4.1 (H) 0.7 - 4.0 K/uL   Monocytes Relative 12 %   Monocytes Absolute 1.2 (H) 0.1 - 1.0 K/uL   Eosinophils Relative 3 %   Eosinophils Absolute 0.3 0.0 - 0.7 K/uL   Basophils Relative 0 %   Basophils Absolute 0.0 0.0 - 0.1 K/uL   I have personally reviewed and evaluated these lab results as part  of my medical decision-making.   EKG Interpretation   Date/Time:  Thursday December 01 2015 04:27:31 EST Ventricular Rate:  113 PR Interval:    QRS Duration: 82 QT Interval:  347 QTC Calculation: 476 R Axis:   -3 Text Interpretation:  Atrial fibrillation with rapid ventricular response  When compared with ECG of 10/29/2015, Atrial fibrillation with rapid  ventricular response has replaced Sinus rhythm Confirmed by [facility]  MD,   ([id number]) on 12/01/2015 4:40:07 AM      MDM   Final diagnoses:  Paroxysmal atrial fibrillation with rapid ventricular response (HCC)    Paroxysmal atrial fibrillation. Heart rate has been very labile while in the room varying from as low as 72 as high as 150. Old records are reviewed confirming history of atrial fibrillation. She is appropriately anticoagulated on rivaroxaban. She did have an echocardiogram and Myoview stress test yesterday and they were essentially normal. She will be given a dose of diltiazem and placed in a diltiazem drip. Also, at the last office visit she had some mild hypokalemia. I will be interested to see what her potassium is today. Recent event monitor showed frequent PVCs and PACs. She does state that she had been on a higher dose of metoprolol in the past but dose was lowered because of bradycardia. I wonder she actually has some degree of sick sinus syndrome and whether she might benefit from pacemaker insertion so that rate controlled medications can be used more aggressively.   Potassium is come back borderline at 3.5 and she'll be given additional potassium. After diltiazem 7.5 mg, heart rate seems to be well  controlled without episodes of bradycardia or tachycardia. She does have an appointment with her cardiologist later today. Will observe her in the ED for another hour and anticipate discharge with follow-up with her cardiologist today.  She was observed in the ED with heart rate being quite stable between 65 and 100. She'll be given a dose of oral diltiazem and discharged to see her cardiologist later today.  Delora Fuel, [place] [id number]

## 2015-12-01 NOTE — Patient Instructions (Addendum)
   Increase Metoprolol tart to 50mg  twice a day  - new sent to [facility] today. Continue all other medications.   Call office with update. Call [place] office to schedule follow up with Estella Husk , PA when you return from your trip.

## 2015-12-01 NOTE — Discharge Instructions (Signed)

## 2015-12-02 ENCOUNTER — Telehealth: Payer: Self-pay | Admitting: Cardiovascular Disease

## 2015-12-02 NOTE — Telephone Encounter (Signed)
New  Message   Pt wants you to know that her heart is in regular   rhythm and that she is okay this morning

## 2015-12-15 ENCOUNTER — Other Ambulatory Visit: Payer: Self-pay

## 2015-12-15 DIAGNOSIS — Z1231 Encounter for screening mammogram for malignant neoplasm of breast: Secondary | ICD-10-CM

## 2015-12-17 DIAGNOSIS — K219 Gastro-esophageal reflux disease without esophagitis: Secondary | ICD-10-CM | POA: Insufficient documentation

## 2015-12-17 DIAGNOSIS — H409 Unspecified glaucoma: Secondary | ICD-10-CM | POA: Insufficient documentation

## 2015-12-17 DIAGNOSIS — Z87891 Personal history of nicotine dependence: Secondary | ICD-10-CM | POA: Insufficient documentation

## 2015-12-17 DIAGNOSIS — Z9851 Tubal ligation status: Secondary | ICD-10-CM | POA: Insufficient documentation

## 2015-12-17 DIAGNOSIS — J45909 Unspecified asthma, uncomplicated: Secondary | ICD-10-CM | POA: Insufficient documentation

## 2015-12-17 DIAGNOSIS — N12 Tubulo-interstitial nephritis, not specified as acute or chronic: Secondary | ICD-10-CM | POA: Diagnosis not present

## 2015-12-17 DIAGNOSIS — N10 Acute tubulo-interstitial nephritis: Secondary | ICD-10-CM | POA: Diagnosis not present

## 2015-12-17 DIAGNOSIS — I480 Paroxysmal atrial fibrillation: Secondary | ICD-10-CM | POA: Diagnosis not present

## 2015-12-17 DIAGNOSIS — R1013 Epigastric pain: Secondary | ICD-10-CM | POA: Diagnosis not present

## 2015-12-17 DIAGNOSIS — Z7901 Long term (current) use of anticoagulants: Secondary | ICD-10-CM | POA: Insufficient documentation

## 2015-12-17 DIAGNOSIS — I10 Essential (primary) hypertension: Secondary | ICD-10-CM | POA: Diagnosis not present

## 2015-12-17 DIAGNOSIS — K5900 Constipation, unspecified: Secondary | ICD-10-CM | POA: Diagnosis not present

## 2015-12-17 DIAGNOSIS — Z79899 Other long term (current) drug therapy: Secondary | ICD-10-CM | POA: Diagnosis not present

## 2015-12-17 DIAGNOSIS — R319 Hematuria, unspecified: Secondary | ICD-10-CM | POA: Diagnosis not present

## 2015-12-18 ENCOUNTER — Emergency Department (HOSPITAL_COMMUNITY)
Admission: EM | Admit: 2015-12-18 | Discharge: 2015-12-18 | Disposition: A | Discharge status: Home / Self Care | Payer: Medicare Other | Attending: Emergency Medicine | Admitting: Emergency Medicine

## 2015-12-18 ENCOUNTER — Encounter (HOSPITAL_COMMUNITY): Payer: Self-pay | Admitting: *Deleted

## 2015-12-18 ENCOUNTER — Emergency Department (HOSPITAL_COMMUNITY): Payer: Medicare Other

## 2015-12-18 DIAGNOSIS — Z79899 Other long term (current) drug therapy: Secondary | ICD-10-CM | POA: Diagnosis not present

## 2015-12-18 DIAGNOSIS — K5900 Constipation, unspecified: Secondary | ICD-10-CM | POA: Diagnosis not present

## 2015-12-18 DIAGNOSIS — R319 Hematuria, unspecified: Secondary | ICD-10-CM | POA: Diagnosis not present

## 2015-12-18 DIAGNOSIS — N10 Acute tubulo-interstitial nephritis: Secondary | ICD-10-CM | POA: Diagnosis not present

## 2015-12-18 DIAGNOSIS — I10 Essential (primary) hypertension: Secondary | ICD-10-CM | POA: Diagnosis not present

## 2015-12-18 DIAGNOSIS — I480 Paroxysmal atrial fibrillation: Secondary | ICD-10-CM | POA: Diagnosis not present

## 2015-12-18 DIAGNOSIS — H409 Unspecified glaucoma: Secondary | ICD-10-CM | POA: Diagnosis not present

## 2015-12-18 DIAGNOSIS — N12 Tubulo-interstitial nephritis, not specified as acute or chronic: Secondary | ICD-10-CM

## 2015-12-18 DIAGNOSIS — Z9851 Tubal ligation status: Secondary | ICD-10-CM | POA: Diagnosis not present

## 2015-12-18 DIAGNOSIS — Z7901 Long term (current) use of anticoagulants: Secondary | ICD-10-CM | POA: Diagnosis not present

## 2015-12-18 DIAGNOSIS — J45909 Unspecified asthma, uncomplicated: Secondary | ICD-10-CM | POA: Diagnosis not present

## 2015-12-18 DIAGNOSIS — R109 Unspecified abdominal pain: Secondary | ICD-10-CM | POA: Diagnosis not present

## 2015-12-18 DIAGNOSIS — K219 Gastro-esophageal reflux disease without esophagitis: Secondary | ICD-10-CM | POA: Diagnosis not present

## 2015-12-18 DIAGNOSIS — R1013 Epigastric pain: Secondary | ICD-10-CM | POA: Diagnosis present

## 2015-12-18 DIAGNOSIS — Z87891 Personal history of nicotine dependence: Secondary | ICD-10-CM | POA: Diagnosis not present

## 2015-12-18 LAB — COMPREHENSIVE METABOLIC PANEL
ALT: 17 U/L (ref 14–54)
AST: 20 U/L (ref 15–41)
Albumin: 4 g/dL (ref 3.5–5.0)
Alkaline Phosphatase: 75 U/L (ref 38–126)
Anion gap: 9 (ref 5–15)
BUN: 15 mg/dL (ref 6–20)
CO2: 27 mmol/L (ref 22–32)
Calcium: 9.5 mg/dL (ref 8.9–10.3)
Chloride: 105 mmol/L (ref 101–111)
Creatinine, Ser: 0.62 mg/dL (ref 0.44–1.00)
GFR calc Af Amer: >60 mL/min (ref >60)
GFR calc non Af Amer: >60 mL/min (ref >60)
Glucose, Bld: 152 mg/dL — ABNORMAL HIGH (ref 65–99)
Potassium: 3.9 mmol/L (ref 3.5–5.1)
Sodium: 141 mmol/L (ref 135–145)
Total Bilirubin: 0.3 mg/dL (ref 0.3–1.2)
Total Protein: 8.1 g/dL (ref 6.5–8.1)

## 2015-12-18 LAB — URINE MICROSCOPIC-ADD ON

## 2015-12-18 LAB — CBC WITH DIFFERENTIAL/PLATELET
Basophils Absolute: 0 10*3/uL (ref 0.0–0.1)
Basophils Relative: 0 %
Eosinophils Absolute: 0.3 10*3/uL (ref 0.0–0.7)
Eosinophils Relative: 2 %
HCT: 39.5 % (ref 36.0–46.0)
Hemoglobin: 13.3 g/dL (ref 12.0–15.0)
Lymphocytes Relative: 26 %
Lymphs Abs: 3.8 10*3/uL (ref 0.7–4.0)
MCH: 30.7 pg (ref 26.0–34.0)
MCHC: 33.7 g/dL (ref 30.0–36.0)
MCV: 91.2 fL (ref 78.0–100.0)
Monocytes Absolute: 1 10*3/uL (ref 0.1–1.0)
Monocytes Relative: 7 %
Neutro Abs: 9.6 10*3/uL — ABNORMAL HIGH (ref 1.7–7.7)
Neutrophils Relative %: 65 %
Platelets: 308 10*3/uL (ref 150–400)
RBC: 4.33 MIL/uL (ref 3.87–5.11)
RDW: 12.9 % (ref 11.5–15.5)
WBC: 14.9 10*3/uL — ABNORMAL HIGH (ref 4.0–10.5)

## 2015-12-18 LAB — URINALYSIS, ROUTINE W REFLEX MICROSCOPIC
Bilirubin Urine: NEGATIVE
Glucose, UA: NEGATIVE mg/dL
Ketones, ur: NEGATIVE mg/dL
Nitrite: NEGATIVE
Specific Gravity, Urine: 1.02 (ref 1.005–1.030)
pH: 6 (ref 5.0–8.0)

## 2015-12-18 LAB — LIPASE, BLOOD: Lipase: 34 U/L (ref 11–51)

## 2015-12-18 MED ORDER — FENTANYL CITRATE (PF) 100 MCG/2ML IJ SOLN
50.0000 ug | Freq: Once | INTRAMUSCULAR | Status: AC
  Administered 2015-12-18: 50 ug via INTRAVENOUS
  Filled 2015-12-18: qty 2

## 2015-12-18 MED ORDER — DEXTROSE 5 % IV SOLN
1.0000 g | Freq: Once | INTRAVENOUS | Status: AC
  Administered 2015-12-18: 1 g via INTRAVENOUS
  Filled 2015-12-18: qty 10

## 2015-12-18 MED ORDER — CEPHALEXIN 500 MG PO CAPS: 500.0000 mg | ORAL_CAPSULE | Freq: Four times a day (QID) | ORAL | Status: DC

## 2015-12-18 MED ORDER — ONDANSETRON HCL 4 MG/2ML IJ SOLN
4.0000 mg | Freq: Once | INTRAMUSCULAR | Status: AC
  Administered 2015-12-18: 4 mg via INTRAVENOUS
  Filled 2015-12-18: qty 2

## 2015-12-18 MED ORDER — IOHEXOL 300 MG/ML  SOLN
100.0000 mL | Freq: Once | INTRAMUSCULAR | Status: AC | PRN
  Administered 2015-12-18: 100 mL via INTRAVENOUS

## 2015-12-18 NOTE — ED Notes (Addendum)
Pt reports abdominal pain that started yesterday. Pt also says blood in her urine, started xarelto a month ago.

## 2015-12-18 NOTE — ED Notes (Signed)
Patient back from restroom. States that her pain is between a 4-5. Asking for pain medication at this time.

## 2015-12-18 NOTE — ED Provider Notes (Signed)
CSN: [id number]     Arrival date & time 12/17/15  2358 History  By signing my name below, I, Hansel Feinstein, attest that this documentation has been prepared under the direction and in the presence of Ripley Fraise, MD. Electronically Signed: Hansel Feinstein, ED Scribe. 12/18/2015. 12:13 AM.    Chief Complaint  Patient presents with  . Abdominal Pain   Patient is a 65 y.o. female presenting with abdominal pain. The history is provided by the patient. No language interpreter was used.  Abdominal Pain Pain location:  Epigastric Pain quality: bloating   Pain radiates to:  Back Pain severity:  Mild Onset quality:  Gradual Duration:  1 day Timing:  Constant Progression:  Worsening Chronicity:  New Context: not previous surgeries   Relieved by: Dulcolax. Worsened by:  Nothing tried Ineffective treatments:  None tried Associated symptoms: constipation and hematuria   Associated symptoms: no chest pain, no fever, no hematochezia, no melena and no vomiting   Risk factors: has not had multiple surgeries     HPI Comments: Heather Cunningham is a 65 y.o. female with h/o HTN, asthma, GERD, PAF on Xarelto who presents to the Emergency Department complaining of moderate abdominal discomfort onset yesterday and worsened today with associated abdominal distention, constipation, hematuria onset tonight. Pt states she had abdominal discomfort and constipation yesterday and took Dulcolax with some relief. She also notes that her urine has been dark/bloody recently. H/o tubal ligation. No h/o cholecystectomy, appendectomy.  She denies emesis, fever, melena, hematochezia, CP, numbness, weakness or paresthesia of the BLE.  Past Medical History  Diagnosis Date  . Hypertension   . Asthma   . GERD (gastroesophageal reflux disease)   . Glaucoma   . Complication of anesthesia   . PONV (postoperative nausea and vomiting)   . Paroxysmal atrial fibrillation (HCC)   . Premature ventricular contraction    Past Surgical  History  Procedure Laterality Date  . Tubal ligation    . [place] surgery    . Cataract extraction    . Colonoscopy N/A 09/05/2015    Procedure: COLONOSCOPY;  Surgeon: [name], MD;  Location: [facility];  Service: Endoscopy;  Laterality: N/A;  incomplete colonoscopy  . Retinal detachment surgery  1997  1999  . Breast lumpectomy    . Colonoscopy with propofol N/A 11/01/2015    Procedure: COLONOSCOPY WITH PROPOFOL at cecum at 0748; withdrawal time=25 minutes;  Surgeon: [name], MD;  Location: [facility];  Service: Endoscopy;  Laterality: N/A;  . Polypectomy  11/01/2015    Procedure: POLYPECTOMY;  Surgeon: [name], MD;  Location: [facility];  Service: Endoscopy;;   Family History  Problem Relation Age of Onset  . Cancer Other   . Heart failure Other   . Colon cancer Mother   . Colon cancer Maternal Grandmother   . Colon polyps Neg Hx    Social History  Substance Use Topics  . Smoking status: Former Smoker -- 1.00 packs/day for 10 years    Types: Cigarettes    Start date: 12/17/1966    Quit date: 12/17/1976  . Smokeless tobacco: Never Used     Comment: QUIT 35 YEARS AGO  . Alcohol Use: No   OB History    No data available     Review of Systems  Constitutional: Negative for fever.  Cardiovascular: Negative for chest pain.  Gastrointestinal: Positive for abdominal pain, constipation and abdominal distention. Negative for vomiting, blood in stool,  melena and hematochezia.  Genitourinary: Positive for hematuria.  Musculoskeletal: Positive for back pain.  Neurological: Negative for weakness and numbness.  All other systems reviewed and are negative.  Allergies  Other  Home Medications   Prior to Admission medications   Medication Sig Start Date End Date Taking? Authorizing Provider  albuterol (PROAIR HFA) 108 (90 BASE) MCG/ACT inhaler Inhale 1-2 puffs into the lungs every 6 (six) hours as needed for wheezing or shortness of breath.    Yes  Historical Provider, MD  Calcium Carbonate-Vitamin D (CALTRATE 600+D) 600-400 MG-UNIT per tablet Take 1 tablet by mouth daily.    Yes Historical Provider, MD  diltiazem (CARDIZEM) 30 MG tablet Take 30 mg by mouth daily as needed (Palpitations).   Yes Historical Provider, MD  docusate sodium (COLACE) 100 MG capsule Take 100 mg by mouth daily.   Yes Historical Provider, MD  Inulin (PHILLIPS FIBER GOOD PO) Take 1 capsule by mouth daily.    Yes Historical Provider, MD  latanoprost (XALATAN) 0.005 % ophthalmic solution Place 1 drop into both eyes at bedtime.   Yes Historical Provider, MD  lisinopril (PRINIVIL,ZESTRIL) 20 MG tablet Take 1 tablet (20 mg total) by mouth daily. 11/16/15  Yes Thompson Grayer, MD  metoprolol (LOPRESSOR) 50 MG tablet Take 1 tablet (50 mg total) by mouth 2 (two) times daily. 12/01/15  Yes Herminio Commons, MD  omeprazole (PRILOSEC) 40 MG capsule Take 40 mg by mouth every other day.    Yes Historical Provider, MD  rivaroxaban (XARELTO) 20 MG TABS tablet Take 20 mg by mouth daily with supper.   Yes Historical Provider, MD  simvastatin (ZOCOR) 20 MG tablet Take 20 mg by mouth daily.     Yes Historical Provider, MD   BP 167/97 mmHg  Pulse 75  Temp(Src) 97.6 F (36.4 C) (Oral)  Resp 16  Ht 5\' 5"  (1.651 m)  Wt 155 lb (70.308 kg)  BMI 25.79 kg/m2  SpO2 93% Physical Exam CONSTITUTIONAL: Well developed/well nourished HEAD: Normocephalic/atraumatic ENMT: Mucous membranes moist NECK: supple no meningeal signs SPINE/BACK:entire spine nontender CV: S1/S2 noted, no murmurs/rubs/gallops noted LUNGS: Lungs are clear to auscultation bilaterally, no apparent distress ABDOMEN: soft, mild epigastric and right mid abdominal pain, no rebound or guarding, bowel sounds noted throughout abdomen GU:no cva tenderness NEURO: Pt is awake/alert/appropriate, moves all extremitiesx4.  No facial droop.   EXTREMITIES: pulses normal/equal, full ROM SKIN: warm, color normal PSYCH: no abnormalities  of mood noted, alert and oriented to situation   ED Course  Procedures  DIAGNOSTIC STUDIES: Oxygen Saturation is 93% on RA, adequate by my interpretation.    COORDINATION OF CARE: 12:11 AM Discussed treatment plan with pt at bedside which includes lab work and pt agreed to plan.  1:08 AM Pt with continued abd pain Will order CT imaging Pt declines pain meds at this time Urinalysis pending at this time 4:06 AM CT shows probable pyelonephritis U/a is consistent with hematuria and infection She is well appearing She is not septic appearing IV rocephin ordered Urine culture sent I feel she is appropriate for d/c home Advised f/u for hematuria but will keep her on xarelto for now  Labs Review Labs Reviewed  CBC WITH DIFFERENTIAL/PLATELET - Abnormal; Notable for the following:    WBC 14.9 (*)    Neutro Abs 9.6 (*)    All other components within normal limits  URINALYSIS, ROUTINE W REFLEX MICROSCOPIC (NOT AT [facility]) - Abnormal; Notable for the following:    APPearance HAZY (*)  Hgb urine dipstick LARGE (*)    Protein, ur TRACE (*)    Leukocytes, UA TRACE (*)    All other components within normal limits  COMPREHENSIVE METABOLIC PANEL - Abnormal; Notable for the following:    Glucose, Bld 152 (*)    All other components within normal limits  URINE MICROSCOPIC-ADD ON - Abnormal; Notable for the following:    Squamous Epithelial / LPF 0-5 (*)    Bacteria, UA MANY (*)    All other components within normal limits  URINE CULTURE  LIPASE, BLOOD    Imaging Review Ct Abdomen Pelvis W Contrast  12/18/2015  CLINICAL DATA:  65 year old female there is with abdominal pain and hematuria. EXAM: CT ABDOMEN AND PELVIS WITH CONTRAST TECHNIQUE: Multidetector CT imaging of the abdomen and pelvis was performed using the standard protocol following bolus administration of intravenous contrast. CONTRAST:  127mL OMNIPAQUE IOHEXOL 300 MG/ML  SOLN COMPARISON:  None. FINDINGS: The visualized lung bases  are clear. No intra-abdominal free air or free fluid. The liver, gallbladder, pancreas, spleen, adrenal glands appear unremarkable. There is mild fullness of the renal collecting systems. No definite calcified stone identified. There is apparent urothelial enhancement with bilateral periureteric haziness concerning for pyelonephritis. Correlation with urinalysis recommended. There is no drainable fluid collection/ abscess. The urinary bladder and uterus are grossly unremarkable. There is moderate stool throughout the colon. No evidence of bowel obstruction. Normal appendix. The abdominal aorta and IVC appear patent. No portal venous gas identified. There is no adenopathy. Abdominal wall soft tissues appear unremarkable. The osseous structures are intact. IMPRESSION: Mild fullness of the renal collecting systems with urothelial enhancement and stranding concerning for pyelonephritis. Correlation with urinalysis recommended. No drainable fluid collection/abscess. Electronically Signed   By: Anner Crete M.D.   On: 12/18/2015 02:35   I have personally reviewed and evaluated these lab results as part of my medical decision-making.   EKG Interpretation  Date/Time:  Sunday December 18 2015 00:29:52 EST Ventricular Rate:  64 PR Interval:  178 QRS Duration: 85 QT Interval:  436 QTC Calculation: 450 R Axis:   17 Text Interpretation:  Sinus rhythm Normal ECG changed from prior, no longer in atrial fibrillation Confirmed by Christy Gentles  MD,  ([id number]) on 12/18/2015 12:32:37 AM        Medications  iohexol (OMNIPAQUE) 300 MG/ML solution 100 mL (100 mLs Intravenous Contrast Given 12/18/15 0136)  fentaNYL (SUBLIMAZE) injection 50 mcg (50 mcg Intravenous Given 12/18/15 0259)  ondansetron (ZOFRAN) injection 4 mg (4 mg Intravenous Given 12/18/15 0259)  cefTRIAXone (ROCEPHIN) 1 g in dextrose 5 % 50 mL IVPB (0 g Intravenous Stopped 12/18/15 0335)    MDM   Final diagnoses:  Pyelonephritis    Nursing notes  including past medical history and social history reviewed and considered in documentation Labs/vital reviewed myself and considered during evaluation  I personally performed the services described in this documentation, which was scribed in my presence. The recorded information has been reviewed and is accurate.        Ripley Fraise, MD 12/18/15 [phone]

## 2015-12-18 NOTE — Discharge Instructions (Signed)

## 2015-12-20 LAB — URINE CULTURE

## 2015-12-21 ENCOUNTER — Encounter: Payer: Self-pay | Admitting: Physician Assistant

## 2015-12-21 ENCOUNTER — Ambulatory Visit (INDEPENDENT_AMBULATORY_CARE_PROVIDER_SITE_OTHER): Payer: Medicare Other | Admitting: Physician Assistant

## 2015-12-21 VITALS — BP 160/82 | HR 67 | Ht 65.0 in | Wt 168.0 lb

## 2015-12-21 DIAGNOSIS — I480 Paroxysmal atrial fibrillation: Secondary | ICD-10-CM

## 2015-12-21 DIAGNOSIS — I2510 Atherosclerotic heart disease of native coronary artery without angina pectoris: Secondary | ICD-10-CM

## 2015-12-21 DIAGNOSIS — I10 Essential (primary) hypertension: Secondary | ICD-10-CM | POA: Diagnosis not present

## 2015-12-21 NOTE — Assessment & Plan Note (Signed)
Patient has been in normal sinus rhythm since metoprolol was increased to 50 mg twice a day. Continue this dose. She takes diltiazem 30 mg when necessary for rapid heart rates. She has not had to use this since last office visit. She had some hematuria with pyelonephritis but her urine is clearing on anti-biotics. Continue Xarelto and continue to monitor. Follow-up with Dr.Koneswaran in 2 months.

## 2015-12-21 NOTE — Progress Notes (Signed)
Cardiology Office Note   Date:  12/21/2015   ID:  Heather Cunningham, DOB Jun 19, 1951, MRN [id number]  PCP:  [name], MD  Cardiologist: Dr. Bronson Ing  Chief Complaint: Palpitations    History of Present Illness: Heather Cunningham is a 65 y.o. female who presents for follow-up. She has a history of paroxysmal atrial fibrillation with RVR. She was last seen in 12/01/15 after emergency room visit with a rapid heart rate. Dr. Bronson Ing increased her metoprolol to 50 mg twice a day. He also discussed elective cardioversion but this was not done because she converted to sinus rhythm that afternoon. Nuclear stress test was normal without ischemia EF 62% 11/30/15. 2-D echo showed normal LV function EF 60-65% with no wall motion abnormality. She is anticoagulated with Xarelto.   She spent the holidays in [place]. She was in the ER 12/18/15 with pyelonephritis treated with Antibiotics. She was nervous because she had blood in her urine but this is clearing with the anti-biotics. She denies any further palpitations since the metoprolol was increased. She says she can tell when she is in atrial fibrillation. Her blood pressure has been elevated but she thinks is due to the pyelonephritis. It was high in the ER but she thought it was due to pain. She used to be on HCTZ but this was stopped by Dr. Rayann Heman. I discussed possibly increasing her lisinopril but she like to wait until her infection clears and monitor her blood pressure at home. She also had extra salt over the holidays.    Past Medical History  Diagnosis Date  . Hypertension   . Asthma   . GERD (gastroesophageal reflux disease)   . Glaucoma   . Complication of anesthesia   . PONV (postoperative nausea and vomiting)   . Paroxysmal atrial fibrillation (HCC)   . Premature ventricular contraction     Past Surgical History  Procedure Laterality Date  . Tubal ligation    . [place] surgery    . Cataract extraction    . Colonoscopy N/A  09/05/2015    Procedure: COLONOSCOPY;  Surgeon: [name], MD;  [facility];  Service: Endoscopy;  Laterality: N/A;  incomplete colonoscopy  . Retinal detachment surgery  1997  1999  . Breast lumpectomy    . Colonoscopy with propofol N/A 11/01/2015    Procedure: COLONOSCOPY WITH PROPOFOL at cecum at 0748; withdrawal time=25 minutes;  Surgeon: [name], MD;  Location: [facility];  Service: Endoscopy;  Laterality: N/A;  . Polypectomy  11/01/2015    Procedure: POLYPECTOMY;  Surgeon: [name], MD;  Location: [facility];  Service: Endoscopy;;     Current Outpatient Prescriptions  Medication Sig Dispense Refill  . albuterol (PROAIR HFA) 108 (90 BASE) MCG/ACT inhaler Inhale 1-2 puffs into the lungs every 6 (six) hours as needed for wheezing or shortness of breath.     . Calcium Carbonate-Vitamin D (CALTRATE 600+D) 600-400 MG-UNIT per tablet Take 1 tablet by mouth daily.     . cephALEXin (KEFLEX) 500 MG capsule Take 1 capsule (500 mg total) by mouth 4 (four) times daily. 40 capsule 0  . diltiazem (CARDIZEM) 30 MG tablet Take 30 mg by mouth daily as needed (Palpitations).    . docusate sodium (COLACE) 100 MG capsule Take 100 mg by mouth daily.    . Inulin (PHILLIPS FIBER GOOD PO) Take 1 capsule by mouth daily.     Marland Kitchen latanoprost (XALATAN) 0.005 % ophthalmic solution Place  1 drop into both eyes at bedtime.    Marland Kitchen lisinopril (PRINIVIL,ZESTRIL) 20 MG tablet Take 1 tablet (20 mg total) by mouth daily. 90 tablet 3  . metoprolol (LOPRESSOR) 50 MG tablet Take 1 tablet (50 mg total) by mouth 2 (two) times daily. 60 tablet 6  . omeprazole (PRILOSEC) 40 MG capsule Take 40 mg by mouth every other day.     . rivaroxaban (XARELTO) 20 MG TABS tablet Take 20 mg by mouth daily with supper.    . simvastatin (ZOCOR) 20 MG tablet Take 20 mg by mouth daily.       No current facility-administered medications for this visit.    Allergies:   Other    Social History:  The patient  reports that she  quit smoking about 39 years ago. Her smoking use included Cigarettes. She started smoking about 49 years ago. She has a 10 pack-year smoking history. She has never used smokeless tobacco. She reports that she does not drink alcohol or use illicit drugs.   Family History:  The patient's    family history includes Cancer in her other; Colon cancer in her maternal grandmother and mother; Heart failure in her other. There is no history of Colon polyps.    ROS:  Please see the history of present illness.   Otherwise, review of systems are positive for trouble sleeping for which she would like to take melatonin.   All other systems are reviewed and negative.    PHYSICAL EXAM: VS:  BP 160/82 mmHg  Pulse 67  Ht 5\' 5"  (1.651 m)  Wt 168 lb (76.204 kg)  BMI 27.96 kg/m2  SpO2 95% , BMI Body mass index is 27.96 kg/(m^2). GEN: Well nourished, well developed, in no acute distress Neck: no JVD, HJR, carotid bruits, or masses Cardiac:  RRR; no murmurs,gallop, rubs, thrill or heave,  Respiratory:  clear to auscultation bilaterally, normal work of breathing GI: soft, nontender, nondistended, + BS MS: no deformity or atrophy Extremities: without cyanosis, clubbing, edema, good distal pulses bilaterally.  Skin: warm and dry, no rash Neuro:  Strength and sensation are intact    EKG:  EKG is not ordered today.    Recent Labs: 10/29/2015: Magnesium 1.9; TSH 1.835 12/18/2015: ALT 17; BUN 15; Creatinine, Ser 0.62; Hemoglobin 13.3; Platelets 308; Potassium 3.9; Sodium 141    Lipid Panel No results found for: CHOL, TRIG, HDL, CHOLHDL, VLDL, LDLCALC, LDLDIRECT    Wt Readings from Last 3 Encounters:  12/21/15 168 lb (76.204 kg)  12/18/15 155 lb (70.308 kg)  12/01/15 168 lb (76.204 kg)      Other studies Reviewed: Additional studies/ records that were reviewed today include and review of the records demonstrates:  2-D echo 11/30/15 Study Conclusions  - Left ventricle: The cavity size was normal.  There was mild   concentric hypertrophy. Systolic function was normal. The   estimated ejection fraction was in the range of 60% to 65%. Wall   motion was normal; there were no regional wall motion   abnormalities. Left ventricular diastolic function parameters   were normal. - Aortic valve: Trileaflet; normal thickness leaflets. There was no   regurgitation. - Aortic root: The aortic root was normal in size. - Ascending aorta: The ascending aorta was normal in size. - Mitral valve: Structurally normal valve. There was no   regurgitation. - Left atrium: The atrium was normal in size. - Right ventricle: The cavity size was normal. Wall thickness was   normal. Systolic function was  normal. - Right atrium: The atrium was normal in size. - Tricuspid valve: There was trivial regurgitation. - Pulmonic valve: There was no regurgitation. - Pulmonary arteries: Systolic pressure was within the normal   range. - Inferior vena cava: The vessel was normal in size. - Pericardium, extracardiac: There was no pericardial effusion.  Impressions:  - Normal study.  Nuclear stress test 11/30/15 Study Highlights      Nuclear stress EF: 62%.  There was no ST segment deviation noted during stress.  The study is normal.  This is a low risk study.  The left ventricular ejection fraction is normal (55-65%).   No ischemia. LV function is normal with EF 62% and no wall motion abnormalities. This is similar to stress echo of 2009 with EF 60-65%    ASSESSMENT AND PLAN:  Atrial fibrillation Patient has been in normal sinus rhythm since metoprolol was increased to 50 mg twice a day. Continue this dose. She takes diltiazem 30 mg when necessary for rapid heart rates. She has not had to use this since last office visit. She had some hematuria with pyelonephritis but her urine is clearing on anti-biotics. Continue Xarelto and continue to monitor. Follow-up with Dr.Koneswaran in 2 months.  Essential  hypertension Blood pressure is elevated today. It was high in the emergency room as well. Patient would like to hold off on increasing medications at this time until her highly oh nephritis clears. She will check her blood pressure daily at home and keep a log. She is to call us with these results. 2 g sodium diet has been given. She may need to have her lisinopril increased to 40 mg daily.  CAD, NATIVE VESSEL Stable without chest pain    Signed, Ermalinda Barrios, PA-C  12/21/2015 11:59 AM    [facility] [address], [place], [place]  [address] Phone: [phone]; Fax: [phone]

## 2015-12-21 NOTE — Patient Instructions (Signed)
Your physician recommends that you schedule a follow-up appointment in: 2 Months with Dr. Bronson Ing.  Your physician has requested that you regularly monitor and record your blood pressure readings at home. Please use the same machine at the same time of day to check your readings and record them to bring to your follow-up visit.  If you need a refill on your cardiac medications before your next appointment, please call your pharmacy.  You have been given a copy of a 2 gm Sodium Diet   Thank you for choosing [facility]!

## 2015-12-21 NOTE — Assessment & Plan Note (Signed)
Blood pressure is elevated today. It was high in the emergency room as well. Patient would like to hold off on increasing medications at this time until her highly oh nephritis clears. She will check her blood pressure daily at home and keep a log. She is to call us with these results. 2 g sodium diet has been given. She may need to have her lisinopril increased to 40 mg daily.

## 2015-12-21 NOTE — Assessment & Plan Note (Signed)
Stable without chest pain 

## 2015-12-26 DIAGNOSIS — R310 Gross hematuria: Secondary | ICD-10-CM | POA: Diagnosis not present

## 2015-12-27 DIAGNOSIS — L570 Actinic keratosis: Secondary | ICD-10-CM | POA: Diagnosis not present

## 2015-12-27 DIAGNOSIS — Z85828 Personal history of other malignant neoplasm of skin: Secondary | ICD-10-CM | POA: Diagnosis not present

## 2016-01-20 ENCOUNTER — Ambulatory Visit
Admission: RE | Admit: 2016-01-20 | Discharge: 2016-01-20 | Disposition: A | Discharge status: Home / Self Care | Payer: Medicare Other | Source: Ambulatory Visit

## 2016-01-20 DIAGNOSIS — Z1231 Encounter for screening mammogram for malignant neoplasm of breast: Secondary | ICD-10-CM

## 2016-02-01 DIAGNOSIS — R319 Hematuria, unspecified: Secondary | ICD-10-CM | POA: Diagnosis not present

## 2016-02-01 DIAGNOSIS — I10 Essential (primary) hypertension: Secondary | ICD-10-CM | POA: Diagnosis not present

## 2016-02-14 ENCOUNTER — Ambulatory Visit (INDEPENDENT_AMBULATORY_CARE_PROVIDER_SITE_OTHER): Payer: Medicare Other | Admitting: Urology

## 2016-02-14 DIAGNOSIS — R310 Gross hematuria: Secondary | ICD-10-CM

## 2016-02-14 DIAGNOSIS — R3129 Other microscopic hematuria: Secondary | ICD-10-CM | POA: Diagnosis not present

## 2016-02-21 ENCOUNTER — Ambulatory Visit (INDEPENDENT_AMBULATORY_CARE_PROVIDER_SITE_OTHER): Payer: Medicare Other | Admitting: Cardiovascular Disease

## 2016-02-21 ENCOUNTER — Encounter: Payer: Self-pay | Admitting: Cardiovascular Disease

## 2016-02-21 VITALS — BP 103/66 | HR 63 | Ht 65.0 in | Wt 171.0 lb

## 2016-02-21 DIAGNOSIS — I10 Essential (primary) hypertension: Secondary | ICD-10-CM

## 2016-02-21 DIAGNOSIS — I2510 Atherosclerotic heart disease of native coronary artery without angina pectoris: Secondary | ICD-10-CM

## 2016-02-21 DIAGNOSIS — I480 Paroxysmal atrial fibrillation: Secondary | ICD-10-CM

## 2016-02-21 MED ORDER — RIVAROXABAN 20 MG PO TABS: 20.0000 mg | ORAL_TABLET | Freq: Every day | ORAL | Status: DC

## 2016-02-21 NOTE — Patient Instructions (Signed)
Continue all current medications. Your physician wants you to follow up in: 6 months.  You will receive a reminder letter in the mail one-two months in advance.  If you don't receive a letter, please call our office to schedule the follow up appointment   

## 2016-02-21 NOTE — Progress Notes (Signed)
Patient ID: Heather Cunningham, female   DOB: 10/02/1951, 65 y.o.   MRN: [id number]      SUBJECTIVE: The patient presents for follow-up of atrial fibrillation.  She underwent nuclear stress testing which was normal on 11/30/15.She also underwent a normal echocardiogram, LVEF 60-65%, with normal left atrial size.  She had pyelonephritis earlier this year and had some hematuria. This resolved after 4 days. She seldom has palpitations. She denies chest pain and shortness of breath.   Review of Systems: As per "subjective", otherwise negative.  Allergies  Allergen Reactions  . Other     CANNOT RECEIVE AN MRI DUE TO METAL BAND (RIGHT EYE) DUE TO DETATCHED RETINA     Current Outpatient Prescriptions  Medication Sig Dispense Refill  . albuterol (PROAIR HFA) 108 (90 BASE) MCG/ACT inhaler Inhale 1-2 puffs into the lungs every 6 (six) hours as needed for wheezing or shortness of breath.     . Calcium Carbonate-Vitamin D (CALTRATE 600+D) 600-400 MG-UNIT per tablet Take 1 tablet by mouth daily.     Marland Kitchen diltiazem (CARDIZEM) 30 MG tablet Take 30 mg by mouth daily as needed (Palpitations).    . docusate sodium (COLACE) 100 MG capsule Take 100 mg by mouth daily.    . Inulin (PHILLIPS FIBER GOOD PO) Take 1 capsule by mouth daily.     Marland Kitchen latanoprost (XALATAN) 0.005 % ophthalmic solution Place 1 drop into both eyes at bedtime.    Marland Kitchen lisinopril-hydrochlorothiazide (PRINZIDE,ZESTORETIC) 20-25 MG tablet Take 1 tablet by mouth daily.    . Melatonin 3 MG CAPS Take 1 capsule by mouth daily.    . metoprolol (LOPRESSOR) 50 MG tablet Take 1 tablet (50 mg total) by mouth 2 (two) times daily. 60 tablet 6  . omeprazole (PRILOSEC) 40 MG capsule Take 40 mg by mouth every other day.     . rivaroxaban (XARELTO) 20 MG TABS tablet Take 20 mg by mouth daily with supper.    . simvastatin (ZOCOR) 20 MG tablet Take 20 mg by mouth daily.       No current facility-administered medications for this visit.    Past Medical History    Diagnosis Date  . Hypertension   . Asthma   . GERD (gastroesophageal reflux disease)   . Glaucoma   . Complication of anesthesia   . PONV (postoperative nausea and vomiting)   . Paroxysmal atrial fibrillation (HCC)   . Premature ventricular contraction     Past Surgical History  Procedure Laterality Date  . Tubal ligation    . [place] surgery    . Cataract extraction    . Colonoscopy N/A 09/05/2015    Procedure: COLONOSCOPY;  Surgeon: [name], MD;  [facility];  Service: Endoscopy;  Laterality: N/A;  incomplete colonoscopy  . Retinal detachment surgery  1997  1999  . Breast lumpectomy    . Colonoscopy with propofol N/A 11/01/2015    Procedure: COLONOSCOPY WITH PROPOFOL at cecum at 0748; withdrawal time=25 minutes;  Surgeon: [name], MD;  Location: [facility];  Service: Endoscopy;  Laterality: N/A;  . Polypectomy  11/01/2015    Procedure: POLYPECTOMY;  Surgeon: [name], MD;  Location: [facility];  Service: Endoscopy;;    Social History   Social History  . Marital Status: Married    Spouse Name: [name]  . Number of Children: 1  . Years of Education: N/A   Occupational History  . [employer] Other  Social History Main Topics  . Smoking status: Former Smoker -- 1.00 packs/day for 10 years    Types: Cigarettes    Start date: 12/17/1966    Quit date: 12/17/1976  . Smokeless tobacco: Never Used     Comment: QUIT 35 YEARS AGO  . Alcohol Use: No  . Drug Use: No  . Sexual Activity: Not on file   Other Topics Concern  . Not on file   Social History Narrative   Pt gets regular exercise. 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. KIDS: [place], [place] [employer].     Filed Vitals:   02/21/16 0804  BP: 103/66  Pulse: 63  Height: 5\' 5"  (1.651 m)  Weight: 171 lb (77.565 kg)    PHYSICAL EXAM General: NAD HEENT: Normal. Neck: No JVD, no  thyromegaly. Lungs: Clear to auscultation bilaterally with normal respiratory effort. CV: Nondisplaced PMI.  Regular rate and rhythm, normal S1/S2, no S3/S4, no murmur. No pretibial or periankle edema.   Abdomen: Soft, nontender, no distention.  Neurologic: Alert and oriented.  Psych: Normal affect. Skin: Normal. Musculoskeletal: No gross deformities.  ECG: Most recent ECG reviewed.      ASSESSMENT AND PLAN: 1. Atrial fibrillation with RVR (paroxysmal):  Currently in a regular rhythm. Continue metoprolol 50 mg bid. Anticoagulated with Xarelto.  2. Essential HTN: Controlled. No changes.  Dispo: f/u 6 months.  Kate Sable, M.D., F.A.C.C.

## 2016-03-07 ENCOUNTER — Encounter: Payer: Self-pay | Admitting: Gastroenterology

## 2016-03-08 DIAGNOSIS — N3000 Acute cystitis without hematuria: Secondary | ICD-10-CM | POA: Diagnosis not present

## 2016-04-04 DIAGNOSIS — H5213 Myopia, bilateral: Secondary | ICD-10-CM | POA: Diagnosis not present

## 2016-04-04 DIAGNOSIS — H43813 Vitreous degeneration, bilateral: Secondary | ICD-10-CM | POA: Diagnosis not present

## 2016-04-04 DIAGNOSIS — H40053 Ocular hypertension, bilateral: Secondary | ICD-10-CM | POA: Diagnosis not present

## 2016-04-04 DIAGNOSIS — H59813 Chorioretinal scars after surgery for detachment, bilateral: Secondary | ICD-10-CM | POA: Diagnosis not present

## 2016-04-04 DIAGNOSIS — H02833 Dermatochalasis of right eye, unspecified eyelid: Secondary | ICD-10-CM | POA: Diagnosis not present

## 2016-04-04 DIAGNOSIS — H029 Unspecified disorder of eyelid: Secondary | ICD-10-CM | POA: Diagnosis not present

## 2016-04-04 DIAGNOSIS — Z961 Presence of intraocular lens: Secondary | ICD-10-CM | POA: Diagnosis not present

## 2016-04-04 DIAGNOSIS — H527 Unspecified disorder of refraction: Secondary | ICD-10-CM | POA: Diagnosis not present

## 2016-04-04 DIAGNOSIS — H02836 Dermatochalasis of left eye, unspecified eyelid: Secondary | ICD-10-CM | POA: Diagnosis not present

## 2016-04-11 ENCOUNTER — Encounter: Payer: Self-pay | Admitting: Gastroenterology

## 2016-04-11 ENCOUNTER — Ambulatory Visit (INDEPENDENT_AMBULATORY_CARE_PROVIDER_SITE_OTHER): Payer: Medicare Other | Admitting: Gastroenterology

## 2016-04-11 VITALS — BP 96/63 | HR 62 | Temp 97.1°F | Ht 65.0 in | Wt 168.8 lb

## 2016-04-11 DIAGNOSIS — D126 Benign neoplasm of colon, unspecified: Secondary | ICD-10-CM | POA: Diagnosis not present

## 2016-04-11 DIAGNOSIS — I2510 Atherosclerotic heart disease of native coronary artery without angina pectoris: Secondary | ICD-10-CM

## 2016-04-11 DIAGNOSIS — K625 Hemorrhage of anus and rectum: Secondary | ICD-10-CM

## 2016-04-11 DIAGNOSIS — K219 Gastro-esophageal reflux disease without esophagitis: Secondary | ICD-10-CM | POA: Diagnosis not present

## 2016-04-11 MED ORDER — OMEPRAZOLE 40 MG PO CPDR: 40.0000 mg | DELAYED_RELEASE_CAPSULE | ORAL | Status: DC

## 2016-04-11 NOTE — Patient Instructions (Signed)
DRINK WATER TO KEEP YOUR URINE LIGHT YELLOW.  FOLLOW A HIGH FIBER/LOW FAT DIET. MEATS SHOULD BE BAKED, BROILED, OR BOILED. AVOID FRIED FOODS. SEE INFO BELOW OF HIGH FIBER DIET.  CONTINUE OMEPRAZOLE.  TAKE 30 MINUTES PRIOR TO YOUR FIRST MEAL.  PLEASE CALL WITH QUESTIONS OR CONCERNS.  I'LL SEE YOU IN ONE YEAR.  High-Fiber Diet A high-fiber diet changes your normal diet to include more whole grains, legumes, fruits, and vegetables. Changes in the diet involve replacing refined carbohydrates with unrefined foods. The calorie level of the diet is essentially unchanged. The Dietary Reference Intake (recommended amount) for adult males is 38 grams per day. For adult females, it is 25 grams per day. Pregnant and lactating women should consume 28 grams of fiber per day. Fiber is the intact part of a plant that is not broken down during digestion. Functional fiber is fiber that has been isolated from the plant to provide a beneficial effect in the body. PURPOSE  Increase stool bulk.   Ease and regulate bowel movements.   Lower cholesterol.  REDUCE RISK OF COLON CANCER  INDICATIONS THAT YOU NEED MORE FIBER  Constipation and hemorrhoids.   Uncomplicated diverticulosis (intestine condition) and irritable bowel syndrome.   Weight management.   As a protective measure against hardening of the arteries (atherosclerosis), diabetes, and cancer.   GUIDELINES FOR INCREASING FIBER IN THE DIET  Start adding fiber to the diet slowly. A gradual increase of about 5 more grams (2 slices of whole-wheat bread, 2 servings of most fruits or vegetables, or 1 bowl of high-fiber cereal) per day is best. Too rapid an increase in fiber may result in constipation, flatulence, and bloating.   Drink enough water and fluids to keep your urine clear or pale yellow. Water, juice, or caffeine-free drinks are recommended. Not drinking enough fluid may cause constipation.   Eat a variety of high-fiber foods rather than  one type of fiber.   Try to increase your intake of fiber through using high-fiber foods rather than fiber pills or supplements that contain small amounts of fiber.   The goal is to change the types of food eaten. Do not supplement your present diet with high-fiber foods, but replace foods in your present diet.   INCLUDE A VARIETY OF FIBER SOURCES  Replace refined and processed grains with whole grains, canned fruits with fresh fruits, and incorporate other fiber sources. White rice, white breads, and most bakery goods contain little or no fiber.   Brown whole-grain rice, buckwheat oats, and many fruits and vegetables are all good sources of fiber. These include: broccoli, Brussels sprouts, cabbage, cauliflower, beets, sweet potatoes, white potatoes (skin on), carrots, tomatoes, eggplant, squash, berries, fresh fruits, and dried fruits.   Cereals appear to be the richest source of fiber. Cereal fiber is found in whole grains and bran. Bran is the fiber-rich outer coat of cereal grain, which is largely removed in refining. In whole-grain cereals, the bran remains. In breakfast cereals, the largest amount of fiber is found in those with "bran" in their names. The fiber content is sometimes indicated on the label.   You may need to include additional fruits and vegetables each day.   In baking, for 1 cup white flour, you may use the following substitutions:   1 cup whole-wheat flour minus 2 tablespoons.   1/2 cup white flour plus 1/2 cup whole-wheat flour.

## 2016-04-11 NOTE — Assessment & Plan Note (Signed)
SYMPTOMS CONTROLLED/RESOLVED.  Use Prilosec 30 minutes prior to your first meal EVERY OTHER DAY. REFILL X 1 YEAR-90 DAY SUPPLY FOLLOW UP IN 1 YEAR.  CALL WITH QUESTIONS OR CONCERNS.

## 2016-04-11 NOTE — Progress Notes (Signed)
Subjective:    Patient ID: Heather Cunningham, female    DOB: 05-10-1951, 65 y.o.   MRN: [id number] [name], MD  HPI NO RECTAL BLEEDING. No questions or concerns. After started Xarelto for ~ 6 weeks saw blood in her urine DUE TO A KIDNEY INFECTION. THIS PAST WEEKEND HAD STOMACH VIRUS: WATERY DIARRHEA, NAUSEA, ABDOMINAL CRAMPS. LASTED A COUPLE OF DAYS. ONE REALLY BAD DAY AND THEN SHE GOT BETTER. TAKES OMEPRAZOLE EVERY OTHER DAY AND HEARTBURN IS CONTROLLED. BMs: DAILY(#4). FIBER MAY GIVE HER GAS.  PT DENIES FEVER, CHILLS, HEMATOCHEZIA, melena, CHEST PAIN, SHORTNESS OF BREATH,  CHANGE IN BOWEL IN HABITS, constipation, problems swallowing, problems with sedation, OR heartburn or indigestion.  Past Medical History  Diagnosis Date  . Hypertension   . Asthma   . GERD (gastroesophageal reflux disease)   . Glaucoma   . Complication of anesthesia   . PONV (postoperative nausea and vomiting)   . Paroxysmal atrial fibrillation (HCC)   . Premature ventricular contraction    Past Surgical History  Procedure Laterality Date  . Tubal ligation    . [place] surgery    . Cataract extraction    . Colonoscopy N/A 09/05/2015    Procedure: COLONOSCOPY;  Surgeon: [name], MD;  [facility];  Service: Endoscopy;  Laterality: N/A;  incomplete colonoscopy  . Retinal detachment surgery  1997  1999  . Breast lumpectomy    . Colonoscopy with propofol N/A 11/01/2015    [id number] hemorrhoids/five polyps/mild diverticulosis  . Polypectomy  11/01/2015    Procedure: POLYPECTOMY;  Surgeon: [name], MD;  Location: [facility];  Service: Endoscopy;;   Allergies  Allergen Reactions  . Other     CANNOT RECEIVE AN MRI DUE TO METAL BAND (RIGHT EYE) DUE TO DETATCHED RETINA    Current Outpatient Prescriptions  Medication Sig Dispense Refill  . albuterol MCG/ACT inhaler Inhale 1-2 puffs into the lungs Q6H PRN for wheezing or SOB     . CALTRATE 600+D) 600-400 MG-UNIT per tablet Take 1  tablet by mouth daily.     Marland Kitchen diltiazem (CARDIZEM) 30 MG tablet Take 30 mg by mouth daily as needed (Palpitations).    . docusate sodium (COLACE) 100 MG capsule Take 100 mg by mouth daily.    . Inulin (PHILLIPS FIBER GOOD PO) Take 1 capsule by mouth daily.     Marland Kitchen XALATAN 0.005 % ophthalmic solution Place 1 drop into both eyes at bedtime.    Marland Kitchen PRINZIDE 20-25 MG tablet Take 1 tablet by mouth daily.    . Melatonin 3 MG CAPS Take 1 capsule by mouth daily.    . metoprolol (LOPRESSOR) 50 MG tablet Take 1 tablet (50 mg total) by mouth 2 (two) times daily.    Marland Kitchen omeprazole (PRILOSEC) 40 MG capsule Take 40 mg by mouth every other day.     . rivaroxaban (XARELTO) 20 MG TABS tablet Take 1 tablet (20 mg total) by mouth daily with supper.    . simvastatin (ZOCOR) 20 MG tablet Take 20 mg by mouth daily.       Review of Systems PER HPI OTHERWISE ALL SYSTEMS ARE NEGATIVE.    Objective:   Physical Exam  Constitutional: She is oriented to person, place, and time. She appears well-developed and well-nourished. No distress.  HENT:  Head: Normocephalic and atraumatic.  Mouth/Throat: Oropharynx is clear and moist. No oropharyngeal exudate.  Eyes: Pupils are equal, round, and reactive to light. No  scleral icterus.  Neck: Normal range of motion. Neck supple.  Cardiovascular: Normal rate, regular rhythm and normal heart sounds.   Pulmonary/Chest: Effort normal and breath sounds normal. No respiratory distress.  Abdominal: Soft. Bowel sounds are normal. She exhibits no distension. There is no tenderness.  Musculoskeletal: She exhibits no edema.  Lymphadenopathy:    She has no cervical adenopathy.  Neurological: She is alert and oriented to person, place, and time.  Psychiatric: She has a normal mood and affect.  Vitals reviewed.     Assessment & Plan:

## 2016-04-11 NOTE — Progress Notes (Signed)
cc'ed to pcp °

## 2016-04-11 NOTE — Progress Notes (Signed)
ON RECALL  °

## 2016-04-11 NOTE — Assessment & Plan Note (Signed)
SYMPTOMS CONTROLLED/RESOLVED.  CONTINUE TO MONITOR SYMPTOMS. 

## 2016-04-11 NOTE — Assessment & Plan Note (Signed)
FIVE SIMPLE ADENOMAS REMOVED DURING TCS NOV 2016.  DISCUSSED PROCEDURE, BENEFITS, RISKS, AND MANAGEMENT OF SIMPLE ADENOMAS. DRINK WATER EAT FIBER NEXT TCS IN 3 YEARS W/ MAC DUE TO FAILED CONSCIOUS SEDATION-PT AGITATION IN SPITE OF ADEQUATE SEDATION.

## 2016-05-28 DIAGNOSIS — L570 Actinic keratosis: Secondary | ICD-10-CM | POA: Diagnosis not present

## 2016-06-05 ENCOUNTER — Other Ambulatory Visit: Payer: Self-pay | Admitting: Cardiovascular Disease

## 2016-06-05 MED ORDER — DILTIAZEM HCL 30 MG PO TABS: 30.0000 mg | ORAL_TABLET | Freq: Every day | ORAL | Status: DC | PRN

## 2016-06-05 NOTE — Telephone Encounter (Signed)
Refill: diltiazem (CARDIZEM) 30 MG  [facility]

## 2016-07-22 ENCOUNTER — Other Ambulatory Visit: Payer: Self-pay | Admitting: Cardiovascular Disease

## 2016-08-10 DIAGNOSIS — I10 Essential (primary) hypertension: Secondary | ICD-10-CM | POA: Diagnosis not present

## 2016-08-10 DIAGNOSIS — E782 Mixed hyperlipidemia: Secondary | ICD-10-CM | POA: Diagnosis not present

## 2016-08-10 DIAGNOSIS — K219 Gastro-esophageal reflux disease without esophagitis: Secondary | ICD-10-CM | POA: Diagnosis not present

## 2016-08-10 DIAGNOSIS — I480 Paroxysmal atrial fibrillation: Secondary | ICD-10-CM | POA: Diagnosis not present

## 2016-08-10 DIAGNOSIS — E559 Vitamin D deficiency, unspecified: Secondary | ICD-10-CM | POA: Diagnosis not present

## 2016-08-13 ENCOUNTER — Other Ambulatory Visit: Payer: Self-pay

## 2016-08-17 DIAGNOSIS — N393 Stress incontinence (female) (male): Secondary | ICD-10-CM | POA: Diagnosis not present

## 2016-08-17 DIAGNOSIS — Z0000 Encounter for general adult medical examination without abnormal findings: Secondary | ICD-10-CM | POA: Diagnosis not present

## 2016-08-17 DIAGNOSIS — K219 Gastro-esophageal reflux disease without esophagitis: Secondary | ICD-10-CM | POA: Diagnosis not present

## 2016-08-17 DIAGNOSIS — I480 Paroxysmal atrial fibrillation: Secondary | ICD-10-CM | POA: Diagnosis not present

## 2016-08-17 DIAGNOSIS — Z6828 Body mass index (BMI) 28.0-28.9, adult: Secondary | ICD-10-CM | POA: Diagnosis not present

## 2016-08-17 DIAGNOSIS — I10 Essential (primary) hypertension: Secondary | ICD-10-CM | POA: Diagnosis not present

## 2016-08-17 DIAGNOSIS — E782 Mixed hyperlipidemia: Secondary | ICD-10-CM | POA: Diagnosis not present

## 2016-08-17 DIAGNOSIS — F418 Other specified anxiety disorders: Secondary | ICD-10-CM | POA: Diagnosis not present

## 2016-09-05 ENCOUNTER — Encounter: Payer: Self-pay | Admitting: *Deleted

## 2016-09-06 ENCOUNTER — Encounter: Payer: Self-pay | Admitting: Cardiovascular Disease

## 2016-09-06 ENCOUNTER — Ambulatory Visit (INDEPENDENT_AMBULATORY_CARE_PROVIDER_SITE_OTHER): Payer: Medicare Other | Admitting: Cardiovascular Disease

## 2016-09-06 VITALS — BP 110/78 | HR 66 | Ht 65.0 in | Wt 168.0 lb

## 2016-09-06 DIAGNOSIS — I10 Essential (primary) hypertension: Secondary | ICD-10-CM | POA: Diagnosis not present

## 2016-09-06 DIAGNOSIS — I480 Paroxysmal atrial fibrillation: Secondary | ICD-10-CM | POA: Diagnosis not present

## 2016-09-06 DIAGNOSIS — I2510 Atherosclerotic heart disease of native coronary artery without angina pectoris: Secondary | ICD-10-CM | POA: Diagnosis not present

## 2016-09-06 DIAGNOSIS — E785 Hyperlipidemia, unspecified: Secondary | ICD-10-CM

## 2016-09-06 NOTE — Patient Instructions (Signed)

## 2016-09-06 NOTE — Progress Notes (Signed)
SUBJECTIVE: The patient presents for follow-up of atrial fibrillation.  She underwent nuclear stress testing which was normal on 11/30/15.She also underwent a normal echocardiogram, LVEF 60-65%, with normal left atrial size.  She seldom has palpitations. She denies chest pain and shortness of breath.  Had pyelonephritis earlier this year and is scheduled to see urology next month.  Labs 08/10/16 total cholesterol 139, triglycerides 138, HDL 30, LDL 81, hemoglobin 12.8, BUN 17, creatinine 0.67.   Review of Systems: As per "subjective", otherwise negative.  Allergies  Allergen Reactions  . Other     CANNOT RECEIVE AN MRI DUE TO METAL BAND (RIGHT EYE) DUE TO DETATCHED RETINA     Current Outpatient Prescriptions  Medication Sig Dispense Refill  . albuterol (PROAIR HFA) 108 (90 BASE) MCG/ACT inhaler Inhale 1-2 puffs into the lungs every 6 (six) hours as needed for wheezing or shortness of breath.     . Calcium Carbonate-Vitamin D (CALTRATE 600+D) 600-400 MG-UNIT per tablet Take 1 tablet by mouth daily.     Marland Kitchen diltiazem (CARDIZEM) 30 MG tablet Take 1 tablet (30 mg total) by mouth daily as needed (Palpitations). 30 tablet 2  . docusate sodium (COLACE) 100 MG capsule Take 100 mg by mouth daily.    . Inulin (PHILLIPS FIBER GOOD PO) Take 1 capsule by mouth daily.     Marland Kitchen latanoprost (XALATAN) 0.005 % ophthalmic solution Place 1 drop into both eyes at bedtime.    Marland Kitchen lisinopril-hydrochlorothiazide (PRINZIDE,ZESTORETIC) 20-25 MG tablet Take 1 tablet by mouth daily.    . Melatonin 3 MG CAPS Take 1 capsule by mouth daily.    . metoprolol (LOPRESSOR) 50 MG tablet TAKE 1 TABLET BY MOUTH TWICE DAILY - DOSE INCREASE 60 tablet 6  . omeprazole (PRILOSEC) 40 MG capsule Take 1 capsule (40 mg total) by mouth every other day. 90 capsule 3  . rivaroxaban (XARELTO) 20 MG TABS tablet Take 1 tablet (20 mg total) by mouth daily with supper. 30 tablet 6  . simvastatin (ZOCOR) 20 MG tablet Take 20 mg by mouth  daily.       No current facility-administered medications for this visit.     Past Medical History:  Diagnosis Date  . Asthma   . Complication of anesthesia   . GERD (gastroesophageal reflux disease)   . Glaucoma   . Hypertension   . Paroxysmal atrial fibrillation (HCC)   . PONV (postoperative nausea and vomiting)   . Premature ventricular contraction     Past Surgical History:  Procedure Laterality Date  . BREAST LUMPECTOMY    . CATARACT EXTRACTION    . COLONOSCOPY N/A 09/05/2015   Procedure: COLONOSCOPY;  Surgeon: [name], MD;  [facility];  Service: Endoscopy;  Laterality: N/A;  incomplete colonoscopy  . COLONOSCOPY WITH PROPOFOL N/A 11/01/2015   [id number] hemorrhoids/five polyps/mild diverticulosis  . EYE SURGERY    . Maxiofacial    . POLYPECTOMY  11/01/2015   Procedure: POLYPECTOMY;  Surgeon: [name], MD;  Location: [facility];  Service: Endoscopy;;  . [place]  . TUBAL LIGATION      Social History   Social History  . Marital status: Married    Spouse name: [name]  . Number of children: 1  . Years of education: N/A   Occupational History  . [employer] Other   Social History Main Topics  . Smoking status: Former Smoker    Packs/day: 1.00  Years: 10.00    Types: Cigarettes    Start date: 12/17/1966    Quit date: 12/17/1976  . Smokeless tobacco: Never Used     Comment: QUIT 35 YEARS AGO  . Alcohol use No  . Drug use: No  . Sexual activity: Not on file   Other Topics Concern  . Not on file   Social History Narrative   Pt gets regular exercise. 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     Vitals:   09/06/16 0832  BP: 110/78  Pulse: 66  SpO2: 99%  Weight: 168 lb (76.2 kg)  Height: 5\' 5"  (1.651 m)    PHYSICAL EXAM General: NAD HEENT: Normal. Neck: No JVD, no thyromegaly. Lungs:  Clear to auscultation bilaterally with normal respiratory effort. CV: Nondisplaced PMI.  Regular rate and rhythm, normal S1/S2, no S3/S4, no murmur. No pretibial or periankle edema.  No carotid bruit.   Abdomen: Soft, nontender, no distention.  Neurologic: Alert and oriented.  Psych: Normal affect. Skin: Normal. Musculoskeletal: No gross deformities.    ECG: Most recent ECG reviewed.      ASSESSMENT AND PLAN: 1. Atrial fibrillation with RVR (paroxysmal):  Currently in a regular rhythm. Continue metoprolol 50 mg bid. Anticoagulated with Xarelto.  2. Essential HTN: Controlled. No changes.  3. Hyperlipidemia: Lipids reviewed above and within normal limits. Continue statin.  Dispo: f/u 1 year.   Kate Sable, M.D., F.A.C.C.

## 2016-09-25 ENCOUNTER — Ambulatory Visit (INDEPENDENT_AMBULATORY_CARE_PROVIDER_SITE_OTHER): Payer: Medicare Other | Admitting: Urology

## 2016-09-25 DIAGNOSIS — R310 Gross hematuria: Secondary | ICD-10-CM

## 2016-10-03 DIAGNOSIS — M85851 Other specified disorders of bone density and structure, right thigh: Secondary | ICD-10-CM | POA: Diagnosis not present

## 2016-10-03 DIAGNOSIS — E7800 Pure hypercholesterolemia, unspecified: Secondary | ICD-10-CM | POA: Diagnosis not present

## 2016-10-03 DIAGNOSIS — Z79899 Other long term (current) drug therapy: Secondary | ICD-10-CM | POA: Diagnosis not present

## 2016-10-03 DIAGNOSIS — I4891 Unspecified atrial fibrillation: Secondary | ICD-10-CM | POA: Diagnosis not present

## 2016-10-03 DIAGNOSIS — Z7901 Long term (current) use of anticoagulants: Secondary | ICD-10-CM | POA: Diagnosis not present

## 2016-10-03 DIAGNOSIS — K219 Gastro-esophageal reflux disease without esophagitis: Secondary | ICD-10-CM | POA: Diagnosis not present

## 2016-10-03 DIAGNOSIS — M810 Age-related osteoporosis without current pathological fracture: Secondary | ICD-10-CM | POA: Diagnosis not present

## 2016-10-03 DIAGNOSIS — I10 Essential (primary) hypertension: Secondary | ICD-10-CM | POA: Diagnosis not present

## 2016-10-03 DIAGNOSIS — Z87891 Personal history of nicotine dependence: Secondary | ICD-10-CM | POA: Diagnosis not present

## 2016-10-03 DIAGNOSIS — Z780 Asymptomatic menopausal state: Secondary | ICD-10-CM | POA: Diagnosis not present

## 2016-10-18 DIAGNOSIS — Z23 Encounter for immunization: Secondary | ICD-10-CM | POA: Diagnosis not present

## 2016-10-18 DIAGNOSIS — Z124 Encounter for screening for malignant neoplasm of cervix: Secondary | ICD-10-CM | POA: Diagnosis not present

## 2016-10-18 DIAGNOSIS — Z6828 Body mass index (BMI) 28.0-28.9, adult: Secondary | ICD-10-CM | POA: Diagnosis not present

## 2016-10-22 DIAGNOSIS — Z6828 Body mass index (BMI) 28.0-28.9, adult: Secondary | ICD-10-CM | POA: Diagnosis not present

## 2016-10-22 DIAGNOSIS — J019 Acute sinusitis, unspecified: Secondary | ICD-10-CM | POA: Diagnosis not present

## 2016-10-22 DIAGNOSIS — J209 Acute bronchitis, unspecified: Secondary | ICD-10-CM | POA: Diagnosis not present

## 2016-11-13 ENCOUNTER — Telehealth: Payer: Self-pay | Admitting: Cardiovascular Disease

## 2016-11-13 NOTE — Telephone Encounter (Signed)
Pt aware and voiced understanding 

## 2016-11-13 NOTE — Telephone Encounter (Signed)
Can take last dose 2 days prior to procedure.

## 2016-11-13 NOTE — Telephone Encounter (Signed)
Mrs. Gawron called stating that she is scheduled to have an Endometrial Biopsy on December 4th.  She is wanting to know if she needs to come off Xarelto.  Please call home # [phone] and leave a message.

## 2016-11-14 DIAGNOSIS — H02836 Dermatochalasis of left eye, unspecified eyelid: Secondary | ICD-10-CM | POA: Diagnosis not present

## 2016-11-14 DIAGNOSIS — Z79899 Other long term (current) drug therapy: Secondary | ICD-10-CM | POA: Diagnosis not present

## 2016-11-14 DIAGNOSIS — Z9841 Cataract extraction status, right eye: Secondary | ICD-10-CM | POA: Diagnosis not present

## 2016-11-14 DIAGNOSIS — H40053 Ocular hypertension, bilateral: Secondary | ICD-10-CM | POA: Diagnosis not present

## 2016-11-14 DIAGNOSIS — H43813 Vitreous degeneration, bilateral: Secondary | ICD-10-CM | POA: Diagnosis not present

## 2016-11-14 DIAGNOSIS — Z7982 Long term (current) use of aspirin: Secondary | ICD-10-CM | POA: Diagnosis not present

## 2016-11-14 DIAGNOSIS — Z9842 Cataract extraction status, left eye: Secondary | ICD-10-CM | POA: Diagnosis not present

## 2016-11-14 DIAGNOSIS — Z961 Presence of intraocular lens: Secondary | ICD-10-CM | POA: Diagnosis not present

## 2016-11-14 DIAGNOSIS — I10 Essential (primary) hypertension: Secondary | ICD-10-CM | POA: Diagnosis not present

## 2016-11-14 DIAGNOSIS — H02833 Dermatochalasis of right eye, unspecified eyelid: Secondary | ICD-10-CM | POA: Diagnosis not present

## 2016-11-14 DIAGNOSIS — Z7901 Long term (current) use of anticoagulants: Secondary | ICD-10-CM | POA: Diagnosis not present

## 2016-11-14 DIAGNOSIS — I4891 Unspecified atrial fibrillation: Secondary | ICD-10-CM | POA: Diagnosis not present

## 2016-11-14 DIAGNOSIS — H59813 Chorioretinal scars after surgery for detachment, bilateral: Secondary | ICD-10-CM | POA: Diagnosis not present

## 2016-11-14 DIAGNOSIS — H5213 Myopia, bilateral: Secondary | ICD-10-CM | POA: Diagnosis not present

## 2016-11-14 DIAGNOSIS — H029 Unspecified disorder of eyelid: Secondary | ICD-10-CM | POA: Diagnosis not present

## 2016-11-14 DIAGNOSIS — Z87891 Personal history of nicotine dependence: Secondary | ICD-10-CM | POA: Diagnosis not present

## 2016-11-19 DIAGNOSIS — N859 Noninflammatory disorder of uterus, unspecified: Secondary | ICD-10-CM | POA: Diagnosis not present

## 2016-11-19 DIAGNOSIS — N950 Postmenopausal bleeding: Secondary | ICD-10-CM | POA: Diagnosis not present

## 2016-11-22 DIAGNOSIS — N950 Postmenopausal bleeding: Secondary | ICD-10-CM | POA: Diagnosis not present

## 2016-11-30 ENCOUNTER — Other Ambulatory Visit: Payer: Self-pay | Admitting: Obstetrics and Gynecology

## 2016-12-03 DIAGNOSIS — L570 Actinic keratosis: Secondary | ICD-10-CM | POA: Diagnosis not present

## 2016-12-03 DIAGNOSIS — Z85828 Personal history of other malignant neoplasm of skin: Secondary | ICD-10-CM | POA: Diagnosis not present

## 2016-12-05 ENCOUNTER — Telehealth: Payer: Self-pay | Admitting: *Deleted

## 2016-12-05 NOTE — Telephone Encounter (Signed)
2 days prior. Resume as soon as feasible.

## 2016-12-05 NOTE — Telephone Encounter (Signed)
Pt scheduled for [facility] 12/31/16 D.r Ulanda Edison - pt wanting to know when to come off xarelto and when to resume after procedure - routed to provider

## 2016-12-05 NOTE — Telephone Encounter (Signed)
Pt aware.

## 2016-12-12 ENCOUNTER — Other Ambulatory Visit: Payer: Self-pay | Admitting: Family Medicine

## 2016-12-12 DIAGNOSIS — Z1231 Encounter for screening mammogram for malignant neoplasm of breast: Secondary | ICD-10-CM

## 2016-12-19 NOTE — Patient Instructions (Addendum)
Your procedure is scheduled on:  Monday, Jan. 15, 2018  Enter through the [employer] [facility] at:  6:00 AM  Pick up the phone at the desk and dial [phone].  Call this number if you have problems the morning of surgery: [phone].  Remember: Do NOT eat food or drink after:  Midnight Sunday, Jan. 14, 2018  Take these medicines the morning of surgery with a SIP OF WATER:  Metoprolol, Omeprazole  Stop taking Xarelto 2 days prior to surgery  Bring Asthma Inhaler day of surgery  Stop ALL herbal medications at this time   Do NOT wear jewelry (body piercing), metal hair clips/bobby pins, make-up, or nail polish. Do NOT wear lotions, powders, or perfumes.  You may wear deodorant. Do NOT shave for 48 hours prior to surgery. Do NOT bring valuables to the hospital. Contacts, dentures, or bridgework may not be worn into surgery.  Have a responsible adult drive you home and stay with you for 24 hours after your procedure  Bring a copy of healthcare power of attorney and living will documents

## 2016-12-20 ENCOUNTER — Encounter (HOSPITAL_COMMUNITY): Payer: Self-pay

## 2016-12-20 ENCOUNTER — Other Ambulatory Visit: Payer: Self-pay

## 2016-12-20 ENCOUNTER — Encounter (HOSPITAL_COMMUNITY)
Admission: RE | Admit: 2016-12-20 | Discharge: 2016-12-20 | Disposition: A | Discharge status: Home / Self Care | Payer: Medicare Other | Source: Ambulatory Visit | Attending: Obstetrics and Gynecology | Admitting: Obstetrics and Gynecology

## 2016-12-20 DIAGNOSIS — Z01812 Encounter for preprocedural laboratory examination: Secondary | ICD-10-CM | POA: Insufficient documentation

## 2016-12-20 DIAGNOSIS — Z01810 Encounter for preprocedural cardiovascular examination: Secondary | ICD-10-CM | POA: Insufficient documentation

## 2016-12-20 HISTORY — DX: Unspecified osteoarthritis, unspecified site: M19.90

## 2016-12-20 HISTORY — DX: Carpal tunnel syndrome, bilateral upper limbs: G56.03

## 2016-12-20 LAB — CBC WITH DIFFERENTIAL/PLATELET
Basophils Absolute: 0 10*3/uL (ref 0.0–0.1)
Basophils Relative: 0 %
Eosinophils Absolute: 0.2 10*3/uL (ref 0.0–0.7)
Eosinophils Relative: 2 %
HCT: 38.4 % (ref 36.0–46.0)
Hemoglobin: 13 g/dL (ref 12.0–15.0)
Lymphocytes Relative: 42 %
Lymphs Abs: 3.7 10*3/uL (ref 0.7–4.0)
MCH: 30.4 pg (ref 26.0–34.0)
MCHC: 33.9 g/dL (ref 30.0–36.0)
MCV: 89.9 fL (ref 78.0–100.0)
Monocytes Absolute: 0.6 10*3/uL (ref 0.1–1.0)
Monocytes Relative: 7 %
Neutro Abs: 4.4 10*3/uL (ref 1.7–7.7)
Neutrophils Relative %: 49 %
Platelets: 270 10*3/uL (ref 150–400)
RBC: 4.27 MIL/uL (ref 3.87–5.11)
RDW: 13.2 % (ref 11.5–15.5)
WBC: 9 10*3/uL (ref 4.0–10.5)

## 2016-12-20 LAB — COMPREHENSIVE METABOLIC PANEL
ALT: 19 U/L (ref 14–54)
AST: 20 U/L (ref 15–41)
Albumin: 4 g/dL (ref 3.5–5.0)
Alkaline Phosphatase: 62 U/L (ref 38–126)
Anion gap: 6 (ref 5–15)
BUN: 14 mg/dL (ref 6–20)
CO2: 31 mmol/L (ref 22–32)
Calcium: 9.2 mg/dL (ref 8.9–10.3)
Chloride: 101 mmol/L (ref 101–111)
Creatinine, Ser: 0.56 mg/dL (ref 0.44–1.00)
GFR calc Af Amer: >60 mL/min (ref >60)
GFR calc non Af Amer: >60 mL/min (ref >60)
Glucose, Bld: 104 mg/dL — ABNORMAL HIGH (ref 65–99)
Potassium: 3.6 mmol/L (ref 3.5–5.1)
Sodium: 138 mmol/L (ref 135–145)
Total Bilirubin: 0.5 mg/dL (ref 0.3–1.2)
Total Protein: 8.1 g/dL (ref 6.5–8.1)

## 2016-12-30 ENCOUNTER — Encounter (HOSPITAL_COMMUNITY): Payer: Self-pay | Admitting: Anesthesiology

## 2016-12-30 MED ORDER — ENOXAPARIN SODIUM 40 MG/0.4ML ~~LOC~~ SOLN
40.0000 mg | SUBCUTANEOUS | Status: AC
  Administered 2016-12-31: 40 mg via SUBCUTANEOUS
  Filled 2016-12-30: qty 0.4

## 2016-12-30 NOTE — Anesthesia Preprocedure Evaluation (Addendum)
Anesthesia Evaluation  Patient identified by MRN, date of birth, ID band Patient awake    Reviewed: Allergy & Precautions, NPO status , Patient's Chart, lab work & pertinent test results  History of Anesthesia Complications (+) PONV and history of anesthetic complications  Airway Mallampati: III  TM Distance: >3 FB Neck ROM: Full    Dental  (+) Partial Upper, Caps,    Pulmonary asthma , former smoker,    Pulmonary exam normal breath sounds clear to auscultation       Cardiovascular hypertension, Pt. on medications and Pt. on home beta blockers + dysrhythmias Atrial Fibrillation  Rhythm:Regular Rate:Bradycardia  Hx/o Paroxysmal Atrial Fibrillation- currently in sinus    Neuro/Psych Glaucoma  Neuromuscular disease negative psych ROS   GI/Hepatic Neg liver ROS, GERD  Medicated and Controlled,  Endo/Other  Hyperlipidemia   Renal/GU negative Renal ROS  negative genitourinary   Musculoskeletal  (+) Arthritis , Cervical spine arthritis   Abdominal   Peds  Hematology On Xarelto for Atrial fibrillation- last dose   Anesthesia Other Findings   Reproductive/Obstetrics Postmenopausal bleeding                            Chemistry      Component Value Date/Time   NA 138 12/20/2016 0900   K 3.6 12/20/2016 0900   CL 101 12/20/2016 0900   CO2 31 12/20/2016 0900   BUN 14 12/20/2016 0900   CREATININE 0.56 12/20/2016 0900      Component Value Date/Time   CALCIUM 9.2 12/20/2016 0900   ALKPHOS 62 12/20/2016 0900   AST 20 12/20/2016 0900   ALT 19 12/20/2016 0900   BILITOT 0.5 12/20/2016 0900     Lab Results  Component Value Date   WBC 9.0 12/20/2016   HGB 13.0 12/20/2016   HCT 38.4 12/20/2016   MCV 89.9 12/20/2016   PLT 270 12/20/2016   EKG: unchanged from previous tracings, sinus bradycardia.  Anesthesia Physical Anesthesia Plan  ASA: III  Anesthesia Plan: General   Post-op Pain  Management:    Induction: Intravenous and Cricoid pressure planned  Airway Management Planned: LMA  Additional Equipment:   Intra-op Plan:   Post-operative Plan: Extubation in OR  Informed Consent: I have reviewed the patients History and Physical, chart, labs and discussed the procedure including the risks, benefits and alternatives for the proposed anesthesia with the patient or authorized representative who has indicated his/her understanding and acceptance.   Dental advisory given  Plan Discussed with: Anesthesiologist, CRNA and Surgeon  Anesthesia Plan Comments:        Anesthesia Quick Evaluation

## 2016-12-30 NOTE — H&P (Signed)
66 yo WF para 1001 for D&C and hysteroscopy and probable removal of endometrial polyp because of PMB, atypical glands on endometrial biopsy, endometrial cells on Pap and a 4x2x1 cm mass in the endometrial cavity. She had her LMP in 2002 and had no bleeding until 10/17 when she had 2 episodes of vaginal spotting. Her Pap showed endometrial cells. An endometrial biopsy showed some atypical tissue and an US showed a 4x2x1 cm mass in the endometrial cavity.   PMH: aLLERGIES None known Ilnesses. HBP, hypercholesterolemia, PAT or AF. GERD  oPERATIONS: Bilateral cataracts, maxillofacial surgery for overbite, breast biopsies Medications: Omeprazole 40 mg qd, xarelto 20 mg qd, metoprolol, simvastatin PE: BP 124/70 P 70 R 16  HENT No abnormalities Neck No mass Breasts Not examined, done by her primary MD Lungs: Clear to auscultation Heart NSR No murmurs Abdomen Soft no mass or tenderness. V&V nO MASS Normal discharge Cervix Small polyp present that is red Uterus : Anterior small Adnexa : clear   Imp: PMB Probable endometrial polyp,R/O endometrial carcinoma. She advised me she has PAT but her records from her doctor' s office say she has AF   Disp: The pt was advised to omit her xarelto 1/13/ 1/14 and 1/15 She will have D&C and hysteroscopy and if the procedure is considered to have been a low risk for bleeding she may resume her xarelto on 01/01/17

## 2016-12-31 ENCOUNTER — Ambulatory Visit (HOSPITAL_COMMUNITY): Payer: Medicare Other | Admitting: Anesthesiology

## 2016-12-31 ENCOUNTER — Encounter (HOSPITAL_COMMUNITY)
Admission: RE | Disposition: A | Discharge status: Home / Self Care | Payer: Self-pay | Source: Ambulatory Visit | Attending: Obstetrics and Gynecology

## 2016-12-31 ENCOUNTER — Ambulatory Visit (HOSPITAL_COMMUNITY)
Admission: RE | Admit: 2016-12-31 | Discharge: 2016-12-31 | Disposition: A | Discharge status: Home / Self Care | Payer: Medicare Other | Source: Ambulatory Visit | Attending: Obstetrics and Gynecology | Admitting: Obstetrics and Gynecology

## 2016-12-31 ENCOUNTER — Encounter (HOSPITAL_COMMUNITY): Payer: Self-pay

## 2016-12-31 DIAGNOSIS — N840 Polyp of corpus uteri: Secondary | ICD-10-CM | POA: Diagnosis not present

## 2016-12-31 DIAGNOSIS — I10 Essential (primary) hypertension: Secondary | ICD-10-CM | POA: Diagnosis not present

## 2016-12-31 DIAGNOSIS — N950 Postmenopausal bleeding: Secondary | ICD-10-CM | POA: Diagnosis not present

## 2016-12-31 DIAGNOSIS — I4891 Unspecified atrial fibrillation: Secondary | ICD-10-CM | POA: Diagnosis not present

## 2016-12-31 DIAGNOSIS — I480 Paroxysmal atrial fibrillation: Secondary | ICD-10-CM

## 2016-12-31 DIAGNOSIS — K625 Hemorrhage of anus and rectum: Secondary | ICD-10-CM | POA: Diagnosis not present

## 2016-12-31 HISTORY — PX: HYSTEROSCOPY W/D&C: SHX1775

## 2016-12-31 HISTORY — PX: HYSTEROSCOPY WITH D & C: SHX1775

## 2016-12-31 SURGERY — DILATATION AND CURETTAGE /HYSTEROSCOPY
Anesthesia: General | Site: Vagina

## 2016-12-31 MED ORDER — DEXAMETHASONE SODIUM PHOSPHATE 4 MG/ML IJ SOLN
INTRAMUSCULAR | Status: DC | PRN
  Administered 2016-12-31: 4 mg via INTRAVENOUS

## 2016-12-31 MED ORDER — FENTANYL CITRATE (PF) 250 MCG/5ML IJ SOLN
INTRAMUSCULAR | Status: DC | PRN
  Administered 2016-12-31: 25 ug via INTRAVENOUS
  Administered 2016-12-31: 50 ug via INTRAVENOUS

## 2016-12-31 MED ORDER — LACTATED RINGERS IV SOLN
INTRAVENOUS | Status: DC
  Administered 2016-12-31: 07:00:00 via INTRAVENOUS

## 2016-12-31 MED ORDER — FENTANYL CITRATE (PF) 100 MCG/2ML IJ SOLN
INTRAMUSCULAR | Status: AC
  Filled 2016-12-31: qty 2

## 2016-12-31 MED ORDER — ONDANSETRON HCL 4 MG/2ML IJ SOLN
INTRAMUSCULAR | Status: AC
  Filled 2016-12-31: qty 2

## 2016-12-31 MED ORDER — MIDAZOLAM HCL 2 MG/2ML IJ SOLN
INTRAMUSCULAR | Status: DC | PRN
  Administered 2016-12-31: 0.5 mg via INTRAVENOUS

## 2016-12-31 MED ORDER — LIDOCAINE HCL (CARDIAC) 20 MG/ML IV SOLN
INTRAVENOUS | Status: AC
  Filled 2016-12-31: qty 5

## 2016-12-31 MED ORDER — METOCLOPRAMIDE HCL 5 MG/ML IJ SOLN: 10.0000 mg | Freq: Once | INTRAMUSCULAR | Status: DC | PRN

## 2016-12-31 MED ORDER — IBUPROFEN 600 MG PO TABS: 600.0000 mg | ORAL_TABLET | Freq: Four times a day (QID) | ORAL | 0 refills | Status: AC | PRN

## 2016-12-31 MED ORDER — PROPOFOL 10 MG/ML IV BOLUS
INTRAVENOUS | Status: DC | PRN
  Administered 2016-12-31: 150 mg via INTRAVENOUS

## 2016-12-31 MED ORDER — SODIUM CHLORIDE 0.9 % IR SOLN
Status: DC | PRN
  Administered 2016-12-31: 3000 mL

## 2016-12-31 MED ORDER — PROPOFOL 10 MG/ML IV BOLUS
INTRAVENOUS | Status: AC
  Filled 2016-12-31: qty 20

## 2016-12-31 MED ORDER — KETOROLAC TROMETHAMINE 30 MG/ML IJ SOLN
INTRAMUSCULAR | Status: DC | PRN
  Administered 2016-12-31: 15 mg via INTRAVENOUS

## 2016-12-31 MED ORDER — FENTANYL CITRATE (PF) 100 MCG/2ML IJ SOLN
25.0000 ug | INTRAMUSCULAR | Status: DC | PRN
  Administered 2016-12-31: 25 ug via INTRAVENOUS

## 2016-12-31 MED ORDER — SCOPOLAMINE 1 MG/3DAYS TD PT72: 1.0000 | MEDICATED_PATCH | TRANSDERMAL | Status: DC

## 2016-12-31 MED ORDER — SCOPOLAMINE 1 MG/3DAYS TD PT72
MEDICATED_PATCH | TRANSDERMAL | Status: AC
  Administered 2016-12-31: 1.5 mg via TRANSDERMAL
  Filled 2016-12-31: qty 1

## 2016-12-31 MED ORDER — PHENYLEPHRINE 40 MCG/ML (10ML) SYRINGE FOR IV PUSH (FOR BLOOD PRESSURE SUPPORT)
PREFILLED_SYRINGE | INTRAVENOUS | Status: AC
  Filled 2016-12-31: qty 10

## 2016-12-31 MED ORDER — KETOROLAC TROMETHAMINE 30 MG/ML IJ SOLN
INTRAMUSCULAR | Status: AC
  Filled 2016-12-31: qty 1

## 2016-12-31 MED ORDER — ONDANSETRON HCL 4 MG/2ML IJ SOLN
INTRAMUSCULAR | Status: DC | PRN
Start: 2016-12-31 — End: 2016-12-31
  Administered 2016-12-31: 4 mg via INTRAVENOUS

## 2016-12-31 MED ORDER — LIDOCAINE HCL (CARDIAC) 20 MG/ML IV SOLN
INTRAVENOUS | Status: DC | PRN
Start: 2016-12-31 — End: 2016-12-31
  Administered 2016-12-31: 80 mg via INTRAVENOUS

## 2016-12-31 MED ORDER — FENTANYL CITRATE (PF) 100 MCG/2ML IJ SOLN
INTRAMUSCULAR | Status: AC
  Filled 2016-12-31: qty 4

## 2016-12-31 MED ORDER — PHENYLEPHRINE HCL 10 MG/ML IJ SOLN
INTRAMUSCULAR | Status: DC | PRN
  Administered 2016-12-31 (×4): 40 ug via INTRAVENOUS

## 2016-12-31 MED ORDER — DEXAMETHASONE SODIUM PHOSPHATE 10 MG/ML IJ SOLN
INTRAMUSCULAR | Status: AC
  Filled 2016-12-31: qty 1

## 2016-12-31 MED ORDER — MIDAZOLAM HCL 2 MG/2ML IJ SOLN
INTRAMUSCULAR | Status: AC
  Filled 2016-12-31: qty 2

## 2016-12-31 MED ORDER — SCOPOLAMINE 1 MG/3DAYS TD PT72
1.0000 | MEDICATED_PATCH | Freq: Once | TRANSDERMAL | Status: DC
  Administered 2016-12-31: 1.5 mg via TRANSDERMAL

## 2016-12-31 SURGICAL SUPPLY — 16 items
CANISTER SUCT 3000ML (MISCELLANEOUS) ×3 IMPLANT
CATH ROBINSON RED A/P 16FR (CATHETERS) ×3 IMPLANT
CLOTH BEACON ORANGE TIMEOUT ST (SAFETY) ×3 IMPLANT
CONTAINER PREFILL 10% NBF 60ML (FORM) ×6 IMPLANT
ELECT REM PT RETURN 9FT ADLT (ELECTROSURGICAL)
ELECTRODE REM PT RTRN 9FT ADLT (ELECTROSURGICAL) IMPLANT
GLOVE BIO SURGEON STRL SZ7.5 (GLOVE) ×3 IMPLANT
GLOVE BIOGEL PI IND STRL 7.0 (GLOVE) ×1 IMPLANT
GLOVE BIOGEL PI INDICATOR 7.0 (GLOVE) ×2
GOWN STRL REUS W/TWL LRG LVL3 (GOWN DISPOSABLE) ×6 IMPLANT
PACK VAGINAL MINOR WOMEN LF (CUSTOM PROCEDURE TRAY) ×3 IMPLANT
PAD OB MATERNITY 4.3X12.25 (PERSONAL CARE ITEMS) ×3 IMPLANT
TOWEL OR 17X24 6PK STRL BLUE (TOWEL DISPOSABLE) ×6 IMPLANT
TUBING AQUILEX INFLOW (TUBING) ×3 IMPLANT
TUBING AQUILEX OUTFLOW (TUBING) ×3 IMPLANT
WATER STERILE IRR 1000ML POUR (IV SOLUTION) ×3 IMPLANT

## 2016-12-31 NOTE — Progress Notes (Signed)
Patient ID: Heather Cunningham, female   DOB: July 23, 1951, 66 y.o.   MRN: [id number] Op note:  Pre and post op dx: PMB, probable endometrial polyp  Op: D&C hysteroscopy, removal of endometrial polyp.   Op:  General anesthesia EBL 5 cc's 200 cc fluid deficit  To RR routine No cardiac arrhythmia during surgery

## 2016-12-31 NOTE — Progress Notes (Signed)
Patient ID: Heather Cunningham, female   DOB: 21-Nov-1951, 66 y.o.   MRN: [id number] I examined this lady 12-28-16 and she reports no major change ion her health since that time.

## 2016-12-31 NOTE — Discharge Instructions (Signed)

## 2016-12-31 NOTE — Op Note (Signed)
NAME:  Heather Cunningham, CRULL                    ACCOUNT NO.:  [id number]  MEDICAL RECORD NO.:  [id number]  LOCATION:                                 FACILITY:  PHYSICIAN:  Lucille Passy. Ulanda Edison, M.D. DATE OF BIRTH:  21-Oct-1951  DATE OF PROCEDURE:  12/31/2016 DATE OF DISCHARGE:                              OPERATIVE REPORT   PREOPERATIVE DIAGNOSES:  Endometrial cells on Pap smear, atypical endometrial tissue on endometrial biopsy, presumed endometrial polyp on ultrasound, and postmenopausal bleeding.  POSTOPERATIVE DIAGNOSES:  Endometrial cells on Pap smear, atypical endometrial tissue on endometrial biopsy, presumed endometrial polyp on ultrasound, and postmenopausal bleeding with a large endometrial polyp.  ANESTHESIA:  General anesthesia.  DESCRIPTION OF PROCEDURE:  The patient was brought to the operating room and placed under satisfactory general anesthesia and placed in [place].  The vulva, vagina, and perineum were prepped with Betadine solution by the RN.  The bladder was emptied with a [place] catheter. Exam revealed the uterus to be midline, normal size.  Adnexa were clear. A time-out was done.  The cervix was exposed with a speculum and a polyp was seen in the cervical os.  I did an endocervical curettage.  Then, I dilated the cervix, placed the hysteroscope into the endometrial cavity, and saw a large polyp extending from the fundus down through the cervical canal to the outside.  I then began removal of the polyp with baby ring forceps, not very large amounts of tissue, reinserted the hysteroscope, saw a little bit of tissue left, so I removed more tissue with the baby ring forceps and then, did a D and C, looked again with the hysteroscope, felt that I could remove no more tissue and the procedure was terminated.  There was no bleeding from the tenaculum site.  It was estimated that the fluid deficit was 200 mL.  Some of the fluid went on the floor.  Blood loss was about 5 mL.   The patient was returned to recovery in satisfactory condition.     Lucille Passy. Ulanda Edison, M.D.   ______________________________ Lucille Passy. Ulanda Edison, M.D.    TFH/MEDQ  D:  12/31/2016  T:  12/31/2016  Job:  [id number]

## 2016-12-31 NOTE — H&P (Signed)
NAME:  Heather, Cunningham                    ACCOUNT NO.:  [id number]  MEDICAL RECORD NO.:  [id number]  LOCATION:                                 FACILITY:  PHYSICIAN:  Lucille Passy. Ulanda Edison, M.D. DATE OF BIRTH:  12-14-1951  DATE OF ADMISSION:  12/31/2016 DATE OF DISCHARGE:                             HISTORY & PHYSICAL   HISTORY OF PRESENT ILLNESS:  This is a 66 year old white female, para 1- 0-0-1, who was seen on November 19, 2016 because she had been noted to have endometrial cells on a Pap smear and also 2 episodes of vaginal spotting when she wiped.  Her last period was in 2002.  She had no vaginal bleeding until October 2017 after her last period when she noted some spotting on her tissue when she wiped on 2 occasions.  The patient has a paroxysmal atrial tachycardia and uses Xarelto for anticoagulation.  At the time I saw her on December 4 she had no recurrence of the bleeding, but on October 18, 2016, she had a Pap that showed no cervical abnormalities but did show endometrial cells.  I saw her to perform an endometrial biopsy.  The uterus sounded to 7 cm. Three passes were done, and each time I placed the endometrial biopsy instrument into the uterus it seemed to contact a structure that it could not remove suggestive of a polyp.  The patient underwent a vaginal ultrasound on December 7 and she was noted to have what appeared to be a 45 x 12 x 17 mm polyp.  I advised her to proceed with surgery after the endometrial biopsy showed rare atypical glands.  The sections demonstrated fragmented benign endometrial tissue with a few had mixed rare atypical glands.  The differential diagnosis was reactive atypia, but neoplastic atypia could not be ruled out.  Additional sampling was recommended.  The patient preferred to delay her surgery until this time and I advised her that if the problem was significant and she was not doing herself any favors by delaying the examination.  PAST MEDICAL  HISTORY:  She has had PAT treated with Xarelto.  ALLERGIES:  She also reports no drug allergies.  MEDICATIONS:  She is on diltiazem, lisinopril, metoprolol, omeprazole, simvastatin, and Ventolin.  SOCIAL HISTORY:  The patient is a former smoker.  Drinks occasional alcohol.  PAST SURGICAL HISTORY:  She has had arthroscopic surgery of her jaw all and she has had cataract surgery.  She claims that her last mammogram was in January 2017 and a colonoscopy was in November 2016.  PHYSICAL EXAM:  GENERAL:  A well developed, well-nourished white female, in no distress. VITAL SIGNS:  Blood pressure 125/70, pulse 80, 5 feet 4 inches, 166 pounds. HEAD, EYES, NOSE, AND THROAT:  Normal. NECK:  Supple without thyromegaly. BREASTS:  Not examined. HEART:  Normal sinus rhythm.  No murmurs. LUNGS:  Clear to auscultation.  No rales, rhonchi, or wheezing. ABDOMEN:  Soft, nontender.  No hepatomegaly or splenomegaly. GU:  Vulva and vagina clean.  Cervix clean.  Uterus anterior, small adnexal free of masses.  There was a 1 cm polyp or maybe an 8 mm polyp in  the cervical os.  The patient is admitted for D and C, hysteroscopy, and probable removal of an endometrial polyp.  She is to refrain from [place] on 13th, 14th, and 15th of January and resume it on 16th assuming the surgery does not become a major surgery.  ADMITTING IMPRESSION:  Endometrial cells on Pap smear, atypical tissue on endometrial biopsy and ultrasound suggestive of an endometrial polyp. The patient understands the risks of surgery and is ready to proceed.     Lucille Passy. Ulanda Edison, M.D.   ______________________________ Lucille Passy. Ulanda Edison, M.D.    TFH/MEDQ  D:  12/28/2016  T:  12/28/2016  Job:  [id number]

## 2016-12-31 NOTE — Transfer of Care (Signed)
Immediate Anesthesia Transfer of Care Note  Patient: Heather Cunningham  Procedure(s) Performed: Procedure(s): DILATATION AND CURETTAGE /HYSTEROSCOPY, POSSIBLE REMOVAL OF ENDOMETRIAL LESION (N/A)  Patient Location: PACU  Anesthesia Type:General  Level of Consciousness: awake, alert  and oriented  Airway & Oxygen Therapy: Patient Spontanous Breathing and Patient connected to nasal cannula oxygen  Post-op Assessment: Report given to RN and Post -op Vital signs reviewed and stable  Post vital signs: Reviewed and stable  Last Vitals:  Vitals:   12/31/16 0608  BP: 114/75  Pulse: (!) 58  Resp: 16  Temp: 36.4 C    Last Pain:  Vitals:   12/31/16 0608  PainSc: 0-No pain      Patients Stated Pain Goal: 3 ([id number])  Complications: No apparent anesthesia complications

## 2016-12-31 NOTE — Anesthesia Procedure Notes (Signed)
Procedure Name: LMA Insertion Date/Time: 12/31/2016 7:28 AM Performed by: Flossie Dibble Pre-anesthesia Checklist: Patient identified, Emergency Drugs available, Suction available, Timeout performed and Patient being monitored Patient Re-evaluated:Patient Re-evaluated prior to inductionOxygen Delivery Method: Circle system utilized Preoxygenation: Pre-oxygenation with 100% oxygen Intubation Type: IV induction Ventilation: Mask ventilation without difficulty LMA: LMA inserted LMA Size: 4.0 Number of attempts: 1 Placement Confirmation: positive ETCO2 and breath sounds checked- equal and bilateral Tube secured with: Tape Dental Injury: Teeth and Oropharynx as per pre-operative assessment

## 2017-01-01 ENCOUNTER — Encounter (HOSPITAL_COMMUNITY): Payer: Self-pay | Admitting: Obstetrics and Gynecology

## 2017-01-01 NOTE — Anesthesia Postprocedure Evaluation (Signed)
Anesthesia Post Note  Patient: Heather Cunningham  Procedure(s) Performed: Procedure(s) (LRB): DILATATION AND CURETTAGE /HYSTEROSCOPY, POSSIBLE REMOVAL OF ENDOMETRIAL LESION (N/A)  Patient location during evaluation: PACU Anesthesia Type: General Level of consciousness: awake and alert Pain management: pain level controlled Vital Signs Assessment: post-procedure vital signs reviewed and stable Respiratory status: spontaneous breathing, nonlabored ventilation and respiratory function stable Cardiovascular status: blood pressure returned to baseline and stable Postop Assessment: no signs of nausea or vomiting Anesthetic complications: no        Last Vitals:  Vitals:   12/31/16 1002 12/31/16 1035  BP: (!) 99/52 (!) 107/54  Pulse: 61 (!) 59  Resp: 15 14  Temp:  36.5 C    Last Pain:  Vitals:   12/31/16 1035  PainSc: 1    Pain Goal: Patients Stated Pain Goal: 3 (12/31/16 1000)               , A.

## 2017-01-23 ENCOUNTER — Ambulatory Visit
Admission: RE | Admit: 2017-01-23 | Discharge: 2017-01-23 | Disposition: A | Discharge status: Home / Self Care | Payer: Medicare Other | Source: Ambulatory Visit | Attending: Family Medicine | Admitting: Family Medicine

## 2017-01-23 DIAGNOSIS — Z1231 Encounter for screening mammogram for malignant neoplasm of breast: Secondary | ICD-10-CM | POA: Diagnosis not present

## 2017-02-18 ENCOUNTER — Encounter: Payer: Self-pay | Admitting: Gastroenterology

## 2017-02-20 ENCOUNTER — Other Ambulatory Visit: Payer: Self-pay | Admitting: Cardiovascular Disease

## 2017-04-09 DIAGNOSIS — M654 Radial styloid tenosynovitis [de Quervain]: Secondary | ICD-10-CM | POA: Diagnosis not present

## 2017-05-16 DIAGNOSIS — M654 Radial styloid tenosynovitis [de Quervain]: Secondary | ICD-10-CM | POA: Diagnosis not present

## 2017-05-20 DIAGNOSIS — H02825 Cysts of left lower eyelid: Secondary | ICD-10-CM | POA: Diagnosis not present

## 2017-05-20 DIAGNOSIS — H40053 Ocular hypertension, bilateral: Secondary | ICD-10-CM | POA: Diagnosis not present

## 2017-05-20 DIAGNOSIS — Z961 Presence of intraocular lens: Secondary | ICD-10-CM | POA: Diagnosis not present

## 2017-05-22 ENCOUNTER — Ambulatory Visit (INDEPENDENT_AMBULATORY_CARE_PROVIDER_SITE_OTHER): Payer: Medicare Other | Admitting: Gastroenterology

## 2017-05-22 ENCOUNTER — Encounter: Payer: Self-pay | Admitting: Gastroenterology

## 2017-05-22 DIAGNOSIS — D126 Benign neoplasm of colon, unspecified: Secondary | ICD-10-CM | POA: Diagnosis not present

## 2017-05-22 DIAGNOSIS — K648 Other hemorrhoids: Secondary | ICD-10-CM | POA: Diagnosis not present

## 2017-05-22 NOTE — Assessment & Plan Note (Signed)
FAILED MODERATE SEDATION.  WILL TRIAGE FOR NEXT COLONOSCOPY IN NOV 2019. DISCUSSED PROCEDURE, BENEFITS, & RISKS: < 1% chance of medication reaction, bleeding, perforation, or rupture of spleen/liver. PT MAY MAKE OPV TO READDRESS BENEFITS V. RISKS. HOLD XARELTO 2 DAYS PRIOR TO TCS-NL CrCl.

## 2017-05-22 NOTE — Progress Notes (Signed)
Subjective:    Patient ID: Heather Cunningham, female    DOB: 1951-04-22, 66 y.o.   MRN: [id number]  [name], MD  HPI Bowels move good with colace and fiber tab and is regular. WEIGHT STABLE. APPETITE: TOO GOOD. ENERGY LEVEL: IMPROVED SINCE GETTING A DOG(GOLDEN DOODLE FOR 1 YEAR NEXT MO). JUST GOT BACK FROM [place]. GOES TO [place] Q 3-4 MOS ([place]). SINCE LAST VISIT HAD UTERINE POLYP REMOVED. IT WAS BENIGN. NO PROBLEMS WITH ANESTHESIA FOR D&C OR TCS. HAD SLIGHT RECALL FROM TCS WITH MODERATE SEDATION. .   PT DENIES FEVER, CHILLS, HEMATOCHEZIA, HEMATEMESIS, nausea, vomiting, melena, diarrhea, CHEST PAIN, SHORTNESS OF BREATH, CHANGE IN BOWEL IN HABITS, constipation, abdominal pain, problems swallowing, OR heartburn or indigestion.  Past Medical History:  Diagnosis Date  . Arthritis    back  . Asthma    reactive airway around cats and dogs  . Bilateral carpal tunnel syndrome   . Complication of anesthesia   . GERD (gastroesophageal reflux disease)   . Glaucoma   . Hypertension   . Paroxysmal atrial fibrillation (HCC)   . PONV (postoperative nausea and vomiting)   . Premature ventricular contraction    Past Surgical History:  Procedure Laterality Date  . BREAST EXCISIONAL BIOPSY Left 1996  . BREAST EXCISIONAL BIOPSY Right 1993  . BREAST LUMPECTOMY    . CATARACT EXTRACTION    . COLONOSCOPY N/A 09/05/2015   Procedure: COLONOSCOPY;  Surgeon: [name], MD;  Location: [facility];  Service: Endoscopy;  Laterality: N/A;  incomplete colonoscopy  . COLONOSCOPY WITH PROPOFOL N/A 11/01/2015   NAT:[id number] hemorrhoids/five polyps/mild diverticulosis  . EYE SURGERY    . HYSTEROSCOPY W/D&C N/A 12/31/2016   Procedure: DILATATION AND CURETTAGE /HYSTEROSCOPY, POSSIBLE REMOVAL OF ENDOMETRIAL LESION;  Surgeon: [name], MD;  Location: [facility];  Service: Gynecology;  Laterality: N/A;  . Maxiofacial    . POLYPECTOMY  11/01/2015   Procedure: POLYPECTOMY;  Surgeon: [name], MD;   Location: [facility];  Service: Endoscopy;;  . [place]  . TUBAL LIGATION     Allergies  Allergen Reactions  . Other     CANNOT RECEIVE AN MRI DUE TO METAL BAND (RIGHT EYE) DUE TO DETATCHED RETINA    Current Outpatient Prescriptions  Medication Sig Dispense Refill  . acetaminophen (TYLENOL) 500 MG tablet Take 1,000 mg by mouth every 6 (six) hours as needed for mild pain.    Marland Kitchen albuterol (PROAIR HFA) 108 (90 BASE) MCG/ACT inhaler Inhale 2 puffs into the lungs every 6 (six) hours as needed for wheezing or shortness of breath.     . calcium carbonate (TUMS - DOSED IN MG ELEMENTAL CALCIUM) 500 MG chewable tablet Chew 1 tablet by mouth every other day. Alternates between tums and omeprazole    . Calcium Carbonate-Vitamin D (CALTRATE 600+D) 600-400 MG-UNIT per tablet Take 1 tablet by mouth daily.     . [employer] FIBER GUMMIES PO Take 1 tablet by mouth daily.    Marland Kitchen diltiazem (CARDIZEM) 30 MG tablet Take 1 tablet (30 mg total) by mouth daily as needed (Palpitations).    . docusate sodium (COLACE) 100 MG capsule Take 100 mg by mouth daily.    . dorzolamide (TRUSOPT) 2 % ophthalmic solution Place 1 drop into the right eye 2 (two) times daily.    Marland Kitchen latanoprost (XALATAN) 0.005 % ophthalmic solution Place 1 drop into both eyes at bedtime.    Marland Kitchen lisinopril-hydrochlorothiazide (PRINZIDE,ZESTORETIC) 20-25 MG  tablet Take 1 tablet by mouth at bedtime.     . Melatonin 3 MG CAPS Take 1 capsule by mouth at bedtime as needed (sleep).     . metoprolol (LOPRESSOR) 50 MG tablet TAKE 1 TABLET BY MOUTH TWICE DAILY (DOSE INCREASE)    . omeprazole (PRILOSEC) 20 MG capsule Take 40 mg by mouth every other day. Alternates between Tums and omeprazole every other day     . simvastatin (ZOCOR) 20 MG tablet Take 20 mg by mouth daily at 6 PM.     . Triamcinolone Acetonide (NASACORT ALLERGY 24HR NA) Place 1 spray into the nose daily as needed (allergies).    Alveda Reasons 20 MG TABS tablet TAKE 1 TABLET BY MOUTH  DAILY WITH SUPPER.     Review of Systems PER HPI OTHERWISE ALL SYSTEMS ARE NEGATIVE.    Objective:   Physical Exam  Constitutional: She is oriented to person, place, and time. She appears well-developed and well-nourished. No distress.  HENT:  Head: Normocephalic and atraumatic.  Mouth/Throat: Oropharynx is clear and moist. No oropharyngeal exudate.  Eyes: Pupils are equal, round, and reactive to light. No scleral icterus.  Neck: Normal range of motion. Neck supple.  Cardiovascular: Normal rate, regular rhythm and normal heart sounds.   Pulmonary/Chest: Effort normal and breath sounds normal. No respiratory distress.  Abdominal: Soft. Bowel sounds are normal. She exhibits no distension. There is no tenderness.  Musculoskeletal: She exhibits no edema.  Lymphadenopathy:    She has no cervical adenopathy.  Neurological: She is alert and oriented to person, place, and time.  NO FOCAL DEFICITS  Psychiatric: She has a normal mood and affect.  Vitals reviewed.     Assessment & Plan:

## 2017-05-22 NOTE — Assessment & Plan Note (Signed)
SYMPTOMS CONTROLLED/RESOLVED.  AVOID CONSTIPATION. CONTINUE COLACE AND FIBER TABS. DRINK WATER TO KEEP YOUR URINE LIGHT YELLOW. FOLLOW A HIGH FIBER DIET.  FOLLOW UP IF NEEDED FOR RECTAL BLEEDING, PRESSURE, PAIN, ITCHING, OR BURNING. CALL WITH QUESTIONS OR CONCERNS.

## 2017-05-22 NOTE — Patient Instructions (Signed)
WE WILL CONTACT YOUR TO SCHEDULE YOUR YOUR NEXT COLONOSCOPY IN NOV 2019.  PLEASE CALL WITH QUESTIONS OR CONCERNS.  DRINK WATER TO KEEP YOUR URINE LIGHT YELLOW.  FOLLOW A HIGH FIBER DIET.   FOLLOW UP AS NEEDED FOR RECTAL BLEEDING, PRESSURE, PAIN, ITCHING, OR BURNING.

## 2017-05-22 NOTE — Progress Notes (Signed)
CC'D TO PCP °

## 2017-06-04 DIAGNOSIS — Z85828 Personal history of other malignant neoplasm of skin: Secondary | ICD-10-CM | POA: Diagnosis not present

## 2017-06-04 DIAGNOSIS — L570 Actinic keratosis: Secondary | ICD-10-CM | POA: Diagnosis not present

## 2017-07-01 DIAGNOSIS — H40053 Ocular hypertension, bilateral: Secondary | ICD-10-CM | POA: Diagnosis not present

## 2017-07-18 DIAGNOSIS — M1812 Unilateral primary osteoarthritis of first carpometacarpal joint, left hand: Secondary | ICD-10-CM | POA: Diagnosis not present

## 2017-07-18 DIAGNOSIS — M65312 Trigger thumb, left thumb: Secondary | ICD-10-CM | POA: Diagnosis not present

## 2017-08-12 DIAGNOSIS — H00015 Hordeolum externum left lower eyelid: Secondary | ICD-10-CM | POA: Diagnosis not present

## 2017-08-12 DIAGNOSIS — H00012 Hordeolum externum right lower eyelid: Secondary | ICD-10-CM | POA: Diagnosis not present

## 2017-08-16 DIAGNOSIS — I480 Paroxysmal atrial fibrillation: Secondary | ICD-10-CM | POA: Diagnosis not present

## 2017-08-16 DIAGNOSIS — M810 Age-related osteoporosis without current pathological fracture: Secondary | ICD-10-CM | POA: Diagnosis not present

## 2017-08-16 DIAGNOSIS — F418 Other specified anxiety disorders: Secondary | ICD-10-CM | POA: Diagnosis not present

## 2017-08-16 DIAGNOSIS — R7309 Other abnormal glucose: Secondary | ICD-10-CM | POA: Diagnosis not present

## 2017-08-16 DIAGNOSIS — E782 Mixed hyperlipidemia: Secondary | ICD-10-CM | POA: Diagnosis not present

## 2017-08-16 DIAGNOSIS — K219 Gastro-esophageal reflux disease without esophagitis: Secondary | ICD-10-CM | POA: Diagnosis not present

## 2017-08-16 DIAGNOSIS — Z0000 Encounter for general adult medical examination without abnormal findings: Secondary | ICD-10-CM | POA: Diagnosis not present

## 2017-08-16 DIAGNOSIS — I10 Essential (primary) hypertension: Secondary | ICD-10-CM | POA: Diagnosis not present

## 2017-08-20 DIAGNOSIS — M65312 Trigger thumb, left thumb: Secondary | ICD-10-CM | POA: Diagnosis not present

## 2017-08-20 DIAGNOSIS — M65341 Trigger finger, right ring finger: Secondary | ICD-10-CM | POA: Diagnosis not present

## 2017-08-21 DIAGNOSIS — I480 Paroxysmal atrial fibrillation: Secondary | ICD-10-CM | POA: Diagnosis not present

## 2017-08-21 DIAGNOSIS — Z0001 Encounter for general adult medical examination with abnormal findings: Secondary | ICD-10-CM | POA: Diagnosis not present

## 2017-08-21 DIAGNOSIS — I10 Essential (primary) hypertension: Secondary | ICD-10-CM | POA: Diagnosis not present

## 2017-08-21 DIAGNOSIS — Z6828 Body mass index (BMI) 28.0-28.9, adult: Secondary | ICD-10-CM | POA: Diagnosis not present

## 2017-08-21 DIAGNOSIS — N393 Stress incontinence (female) (male): Secondary | ICD-10-CM | POA: Diagnosis not present

## 2017-09-03 ENCOUNTER — Other Ambulatory Visit: Payer: Self-pay | Admitting: Cardiovascular Disease

## 2017-09-05 ENCOUNTER — Encounter: Payer: Self-pay | Admitting: Cardiovascular Disease

## 2017-09-05 ENCOUNTER — Ambulatory Visit (INDEPENDENT_AMBULATORY_CARE_PROVIDER_SITE_OTHER): Payer: Medicare Other | Admitting: Cardiovascular Disease

## 2017-09-05 VITALS — BP 102/60 | HR 57 | Ht 65.0 in | Wt 165.0 lb

## 2017-09-05 DIAGNOSIS — I10 Essential (primary) hypertension: Secondary | ICD-10-CM

## 2017-09-05 DIAGNOSIS — E785 Hyperlipidemia, unspecified: Secondary | ICD-10-CM

## 2017-09-05 DIAGNOSIS — I480 Paroxysmal atrial fibrillation: Secondary | ICD-10-CM | POA: Diagnosis not present

## 2017-09-05 MED ORDER — METOPROLOL TARTRATE 50 MG PO TABS: 50.0000 mg | ORAL_TABLET | Freq: Two times a day (BID) | ORAL | 3 refills | Status: DC

## 2017-09-05 MED ORDER — DILTIAZEM HCL 30 MG PO TABS: 30.0000 mg | ORAL_TABLET | Freq: Every day | ORAL | 6 refills | Status: DC | PRN

## 2017-09-05 NOTE — Patient Instructions (Signed)
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
## 2017-11-15 DIAGNOSIS — H401111 Primary open-angle glaucoma, right eye, mild stage: Secondary | ICD-10-CM | POA: Diagnosis not present

## 2017-11-15 DIAGNOSIS — H401121 Primary open-angle glaucoma, left eye, mild stage: Secondary | ICD-10-CM | POA: Diagnosis not present

## 2017-11-22 DIAGNOSIS — M65341 Trigger finger, right ring finger: Secondary | ICD-10-CM | POA: Diagnosis not present

## 2017-11-22 DIAGNOSIS — M654 Radial styloid tenosynovitis [de Quervain]: Secondary | ICD-10-CM | POA: Diagnosis not present

## 2017-11-22 DIAGNOSIS — M65312 Trigger thumb, left thumb: Secondary | ICD-10-CM | POA: Diagnosis not present

## 2017-12-11 ENCOUNTER — Other Ambulatory Visit: Payer: Self-pay | Admitting: Family Medicine

## 2017-12-11 DIAGNOSIS — Z1231 Encounter for screening mammogram for malignant neoplasm of breast: Secondary | ICD-10-CM

## 2017-12-18 DIAGNOSIS — L570 Actinic keratosis: Secondary | ICD-10-CM | POA: Diagnosis not present

## 2018-01-03 DIAGNOSIS — M65312 Trigger thumb, left thumb: Secondary | ICD-10-CM | POA: Diagnosis not present

## 2018-01-23 DIAGNOSIS — J0101 Acute recurrent maxillary sinusitis: Secondary | ICD-10-CM | POA: Diagnosis not present

## 2018-01-23 DIAGNOSIS — Z6829 Body mass index (BMI) 29.0-29.9, adult: Secondary | ICD-10-CM | POA: Diagnosis not present

## 2018-01-24 ENCOUNTER — Ambulatory Visit
Admission: RE | Admit: 2018-01-24 | Discharge: 2018-01-24 | Disposition: A | Discharge status: Home / Self Care | Payer: Medicare Other | Source: Ambulatory Visit | Attending: Family Medicine | Admitting: Family Medicine

## 2018-01-24 DIAGNOSIS — Z1231 Encounter for screening mammogram for malignant neoplasm of breast: Secondary | ICD-10-CM

## 2018-03-04 ENCOUNTER — Other Ambulatory Visit: Payer: Self-pay | Admitting: Cardiovascular Disease

## 2018-03-04 MED ORDER — RIVAROXABAN 20 MG PO TABS: ORAL_TABLET | ORAL | 6 refills | Status: DC

## 2018-03-04 NOTE — Telephone Encounter (Signed)
RX SENT

## 2018-03-04 NOTE — Telephone Encounter (Signed)
°*  STAT* If patient is at the pharmacy, call can be transferred to refill team.   1. Which medications need to be refilled?  XARELTO 20 MG TABS tablet    2. Which pharmacy/location (including street and city if local pharmacy) is medication to be sent to [facility]  , [place]    3. Do they need a 30 day or 90 day supply?

## 2018-04-10 DIAGNOSIS — H401121 Primary open-angle glaucoma, left eye, mild stage: Secondary | ICD-10-CM | POA: Diagnosis not present

## 2018-04-10 DIAGNOSIS — H401111 Primary open-angle glaucoma, right eye, mild stage: Secondary | ICD-10-CM | POA: Diagnosis not present

## 2018-04-14 ENCOUNTER — Telehealth: Payer: Self-pay | Admitting: Cardiovascular Disease

## 2018-04-14 DIAGNOSIS — H401111 Primary open-angle glaucoma, right eye, mild stage: Secondary | ICD-10-CM | POA: Diagnosis not present

## 2018-04-14 DIAGNOSIS — H401121 Primary open-angle glaucoma, left eye, mild stage: Secondary | ICD-10-CM | POA: Diagnosis not present

## 2018-04-14 NOTE — Telephone Encounter (Signed)
That should be fine. 

## 2018-04-14 NOTE — Telephone Encounter (Signed)
Patient called stating that she is having back pain.  Wants to know if she use Aspercream on her lower back with taking heart medications.

## 2018-04-14 NOTE — Telephone Encounter (Signed)
Patient notified. Routed to PCP 

## 2018-04-15 DIAGNOSIS — Z6829 Body mass index (BMI) 29.0-29.9, adult: Secondary | ICD-10-CM | POA: Diagnosis not present

## 2018-04-15 DIAGNOSIS — M7061 Trochanteric bursitis, right hip: Secondary | ICD-10-CM | POA: Diagnosis not present

## 2018-04-15 DIAGNOSIS — M545 Low back pain: Secondary | ICD-10-CM | POA: Diagnosis not present

## 2018-05-06 DIAGNOSIS — Z6829 Body mass index (BMI) 29.0-29.9, adult: Secondary | ICD-10-CM | POA: Diagnosis not present

## 2018-05-06 DIAGNOSIS — J0101 Acute recurrent maxillary sinusitis: Secondary | ICD-10-CM | POA: Diagnosis not present

## 2018-06-24 DIAGNOSIS — L821 Other seborrheic keratosis: Secondary | ICD-10-CM | POA: Diagnosis not present

## 2018-06-24 DIAGNOSIS — Z85828 Personal history of other malignant neoplasm of skin: Secondary | ICD-10-CM | POA: Diagnosis not present

## 2018-06-24 DIAGNOSIS — B078 Other viral warts: Secondary | ICD-10-CM | POA: Diagnosis not present

## 2018-06-24 DIAGNOSIS — D485 Neoplasm of uncertain behavior of skin: Secondary | ICD-10-CM | POA: Diagnosis not present

## 2018-06-24 DIAGNOSIS — L570 Actinic keratosis: Secondary | ICD-10-CM | POA: Diagnosis not present

## 2018-08-19 ENCOUNTER — Telehealth: Payer: Self-pay | Admitting: Cardiovascular Disease

## 2018-08-19 DIAGNOSIS — I480 Paroxysmal atrial fibrillation: Secondary | ICD-10-CM

## 2018-08-19 NOTE — Telephone Encounter (Signed)
Over the past month, has had 4 episode of her PAF & lasting about 4 hours.  Did have to use the Diltiazem 30mg  x 1 at each episode.  During the episode does note some weakness.  No c/o dizziness or SOB.  States that she does feel it up in her throat during these episodes.  Is not currently on any Potassium.  Stated approximately 2 years ago, she did have similar issues when her Potassium was low.  Due for her 1 year with Dr. Bronson Ing 09-26-2018.

## 2018-08-19 NOTE — Telephone Encounter (Signed)
Patient called asking if she can get lab order to have Potasium checked due to episodes she has been having

## 2018-08-19 NOTE — Telephone Encounter (Signed)
Has her pcp done any labs recently? If not can we get a BMET/Mg/TSH for her. Continue her prn diltiazem for now   Zandra Abts MD

## 2018-08-20 DIAGNOSIS — I480 Paroxysmal atrial fibrillation: Secondary | ICD-10-CM | POA: Diagnosis not present

## 2018-08-20 NOTE — Telephone Encounter (Signed)
No recent lab work from [facility] - pt will come by office and pick up lab orders to have done - will continue prn dilt

## 2018-08-21 ENCOUNTER — Telehealth: Payer: Self-pay | Admitting: Cardiovascular Disease

## 2018-08-21 NOTE — Telephone Encounter (Signed)
Patient for results of lab work

## 2018-08-22 ENCOUNTER — Telehealth: Payer: Self-pay | Admitting: *Deleted

## 2018-08-22 NOTE — Telephone Encounter (Signed)
Patient informed that lab work has not been resulted on yet and she would be contacted once they are reviewed. Verbalized understanding.

## 2018-08-22 NOTE — Telephone Encounter (Signed)
Patient came in asking for results of her lab work.  Also would like a copy mailed to her after she is notified.   She asked not to tell husband results due to him having dementia

## 2018-08-22 NOTE — Telephone Encounter (Signed)
Patient informed and verbalized understanding

## 2018-08-22 NOTE — Telephone Encounter (Signed)
-----   Message from Arnoldo Lenis, MD sent at 08/22/2018 12:56 PM EDT ----- Labs look good. I would continue her prn diltiazem for now. With her heart rates and blood pressures from last visit we don't have much room to adjust her meds. If the afib continues to be a problem we would need to consider a whole different class of medicines called antiarrhythmics, which work well but can be a little trickier to use. Continue prn dilt and keep current appt with Dr Raliegh Ip, contact us if symptoms significantly progress   Zandra Abts MD

## 2018-09-05 ENCOUNTER — Ambulatory Visit: Payer: Medicare Other | Admitting: Cardiovascular Disease

## 2018-09-19 DIAGNOSIS — I480 Paroxysmal atrial fibrillation: Secondary | ICD-10-CM | POA: Diagnosis not present

## 2018-09-19 DIAGNOSIS — K219 Gastro-esophageal reflux disease without esophagitis: Secondary | ICD-10-CM | POA: Diagnosis not present

## 2018-09-19 DIAGNOSIS — R739 Hyperglycemia, unspecified: Secondary | ICD-10-CM | POA: Diagnosis not present

## 2018-09-19 DIAGNOSIS — F418 Other specified anxiety disorders: Secondary | ICD-10-CM | POA: Diagnosis not present

## 2018-09-19 DIAGNOSIS — E782 Mixed hyperlipidemia: Secondary | ICD-10-CM | POA: Diagnosis not present

## 2018-09-19 DIAGNOSIS — I10 Essential (primary) hypertension: Secondary | ICD-10-CM | POA: Diagnosis not present

## 2018-09-20 DIAGNOSIS — Z6829 Body mass index (BMI) 29.0-29.9, adult: Secondary | ICD-10-CM | POA: Diagnosis not present

## 2018-09-20 DIAGNOSIS — R109 Unspecified abdominal pain: Secondary | ICD-10-CM | POA: Diagnosis not present

## 2018-09-22 DIAGNOSIS — H401131 Primary open-angle glaucoma, bilateral, mild stage: Secondary | ICD-10-CM | POA: Diagnosis not present

## 2018-09-23 DIAGNOSIS — Z6829 Body mass index (BMI) 29.0-29.9, adult: Secondary | ICD-10-CM | POA: Diagnosis not present

## 2018-09-23 DIAGNOSIS — I10 Essential (primary) hypertension: Secondary | ICD-10-CM | POA: Diagnosis not present

## 2018-09-23 DIAGNOSIS — E782 Mixed hyperlipidemia: Secondary | ICD-10-CM | POA: Diagnosis not present

## 2018-09-23 DIAGNOSIS — I480 Paroxysmal atrial fibrillation: Secondary | ICD-10-CM | POA: Diagnosis not present

## 2018-09-23 DIAGNOSIS — M810 Age-related osteoporosis without current pathological fracture: Secondary | ICD-10-CM | POA: Diagnosis not present

## 2018-09-24 ENCOUNTER — Ambulatory Visit: Payer: Medicare Other | Admitting: Cardiovascular Disease

## 2018-09-25 ENCOUNTER — Encounter: Payer: Self-pay | Admitting: Gastroenterology

## 2018-09-26 ENCOUNTER — Ambulatory Visit (INDEPENDENT_AMBULATORY_CARE_PROVIDER_SITE_OTHER): Payer: Medicare Other | Admitting: Cardiovascular Disease

## 2018-09-26 ENCOUNTER — Encounter: Payer: Self-pay | Admitting: Cardiovascular Disease

## 2018-09-26 VITALS — BP 104/67 | HR 63 | Ht 65.0 in | Wt 174.4 lb

## 2018-09-26 DIAGNOSIS — I480 Paroxysmal atrial fibrillation: Secondary | ICD-10-CM | POA: Diagnosis not present

## 2018-09-26 DIAGNOSIS — E785 Hyperlipidemia, unspecified: Secondary | ICD-10-CM | POA: Diagnosis not present

## 2018-09-26 DIAGNOSIS — I10 Essential (primary) hypertension: Secondary | ICD-10-CM | POA: Diagnosis not present

## 2018-09-26 MED ORDER — DILTIAZEM HCL 30 MG PO TABS: 30.0000 mg | ORAL_TABLET | Freq: Every day | ORAL | 6 refills | Status: DC | PRN

## 2018-09-26 MED ORDER — METOPROLOL TARTRATE 50 MG PO TABS: 50.0000 mg | ORAL_TABLET | Freq: Two times a day (BID) | ORAL | 3 refills | Status: DC

## 2018-09-26 NOTE — Progress Notes (Signed)
SUBJECTIVE: The patient presents for follow-up of paroxysmal atrial fibrillation. She underwent nuclear stress testing which was normal on 11/30/15. She also underwent a normal echocardiogram, LVEF 60-65%, with normal left atrial size.  ECG performed in the office today which I ordered and personally interpreted demonstrates normal sinus rhythm with no ischemic ST segment or T-wave abnormalities, nor any arrhythmias.  I reviewed labs dated 08/20/2018: She had a normal TSH, magnesium, and basic metabolic panel.  She thinks she has had 3 episodes of atrial fibrillation since July.  She has had more frequent PVCs at night she believes.  She denies exertional chest pain and shortness of breath.  She denies leg swelling, orthopnea, and paroxysmal nocturnal dyspnea.  She experiences vertigo about once or twice a year and takes meclizine.  She continues to work part-time at [employer] office.  She plans to retire in the near future.    Review of Systems: As per "subjective", otherwise negative.  Allergies  Allergen Reactions  . Other     CANNOT RECEIVE AN MRI DUE TO METAL BAND (RIGHT EYE) DUE TO DETATCHED RETINA     Current Outpatient Medications  Medication Sig Dispense Refill  . acetaminophen (TYLENOL) 500 MG tablet Take 1,000 mg by mouth every 6 (six) hours as needed for mild pain.    Marland Kitchen albuterol (PROAIR HFA) 108 (90 BASE) MCG/ACT inhaler Inhale 2 puffs into the lungs every 6 (six) hours as needed for wheezing or shortness of breath.     . calcium carbonate (TUMS - DOSED IN MG ELEMENTAL CALCIUM) 500 MG chewable tablet Chew 1 tablet by mouth every other day. Alternates between tums and omeprazole    . Calcium Carbonate-Vitamin D (CALTRATE 600+D) 600-400 MG-UNIT per tablet Take 1 tablet by mouth daily.     . [employer] FIBER GUMMIES PO Take 1 tablet by mouth daily.    Marland Kitchen diltiazem (CARDIZEM) 30 MG tablet Take 1 tablet (30 mg total) by mouth daily as needed (Palpitations). 30 tablet 6  .  docusate sodium (COLACE) 100 MG capsule Take 100 mg by mouth daily.    . dorzolamide (TRUSOPT) 2 % ophthalmic solution Place 1 drop into the right eye 2 (two) times daily.    Marland Kitchen latanoprost (XALATAN) 0.005 % ophthalmic solution Place 1 drop into both eyes at bedtime.    Marland Kitchen lisinopril-hydrochlorothiazide (PRINZIDE,ZESTORETIC) 20-25 MG tablet Take 1 tablet by mouth at bedtime.     . Meclizine HCl (MEDI-MECLIZINE PO) Take by mouth.    . Melatonin 3 MG CAPS Take 1 capsule by mouth at bedtime as needed (sleep).     . metoprolol tartrate (LOPRESSOR) 50 MG tablet Take 1 tablet (50 mg total) by mouth 2 (two) times daily. 180 tablet 3  . omeprazole (PRILOSEC) 20 MG capsule Take 40 mg by mouth every other day. Alternates between Tums and omeprazole every other day     . rivaroxaban (XARELTO) 20 MG TABS tablet TAKE 1 TABLET BY MOUTH DAILY WITH SUPPER. 30 tablet 6  . simvastatin (ZOCOR) 20 MG tablet Take 20 mg by mouth daily at 6 PM.      No current facility-administered medications for this visit.     Past Medical History:  Diagnosis Date  . Arthritis    back  . Asthma    reactive airway around cats and dogs  . Bilateral carpal tunnel syndrome   . Complication of anesthesia   . GERD (gastroesophageal reflux disease)   . Glaucoma   .  Hypertension   . Paroxysmal atrial fibrillation (HCC)   . PONV (postoperative nausea and vomiting)   . Premature ventricular contraction     Past Surgical History:  Procedure Laterality Date  . BREAST EXCISIONAL BIOPSY Left 1996  . BREAST EXCISIONAL BIOPSY Right 1993  . BREAST LUMPECTOMY    . CATARACT EXTRACTION    . COLONOSCOPY N/A 09/05/2015   Procedure: COLONOSCOPY;  Surgeon: [name], MD;  Location: [facility];  Service: Endoscopy;  Laterality: N/A;  incomplete colonoscopy  . COLONOSCOPY WITH PROPOFOL N/A 11/01/2015   [id number] hemorrhoids/five polyps/mild diverticulosis  . EYE SURGERY    . HYSTEROSCOPY W/D&C N/A 12/31/2016   Procedure:  DILATATION AND CURETTAGE /HYSTEROSCOPY, POSSIBLE REMOVAL OF ENDOMETRIAL LESION;  Surgeon: [name], MD;  Location: [facility];  Service: Gynecology;  Laterality: N/A;  . Maxiofacial    . POLYPECTOMY  11/01/2015   Procedure: POLYPECTOMY;  Surgeon: [name], MD;  Location: [facility];  Service: Endoscopy;;  . [place]  . TUBAL LIGATION      Social History   Socioeconomic History  . Marital status: Married    Spouse name: [name]  . Number of children: 1  . Years of education: Not on file  . Highest education level: Not on file  Occupational History  . Occupation: [employer]    Employer: OTHER  Social Needs  . Financial resource strain: Not on file  . Food insecurity:    Worry: Not on file    Inability: Not on file  . Transportation needs:    Medical: Not on file    Non-medical: Not on file  Tobacco Use  . Smoking status: Former Smoker    Packs/day: 1.00    Years: 10.00    Pack years: 10.00    Types: Cigarettes    Start date: 12/17/1966    Last attempt to quit: 12/17/1976    Years since quitting: 41.8  . Smokeless tobacco: Never Used  . Tobacco comment: QUIT 35 YEARS AGO  Substance and Sexual Activity  . Alcohol use: No    Alcohol/week: 0.0 standard drinks  . Drug use: No  . Sexual activity: Not on file  Lifestyle  . Physical activity:    Days per week: Not on file    Minutes per session: Not on file  . Stress: Not on file  Relationships  . Social connections:    Talks on phone: Not on file    Gets together: Not on file    Attends religious service: Not on file    Active member of club or organization: Not on file    Attends meetings of clubs or organizations: Not on file    Relationship status: Not on file  . Intimate partner violence:    Fear of current or ex partner: Not on file    Emotionally abused: Not on file    Physically abused: Not on file    Forced sexual activity: Not on file  Other Topics Concern  .  Not on file  Social History Narrative   Pt gets regular exercise. 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 WORKED FOR [employer] [place] [employer]. KIDS: 1 [place], [place] WAS IN [employer].     Vitals:   09/26/18 0945  BP: 104/67  Pulse: 63  SpO2: 95%  Weight: 174 lb 6.4 oz (79.1 kg)  Height: 5\' 5"  (1.651 m)    Wt Readings from Last 3 Encounters:  09/26/18 174 lb 6.4 oz (79.1 kg)  09/05/17 165 lb (74.8 kg)  05/22/17 169 lb 3.2 oz (76.7 kg)     PHYSICAL EXAM General: NAD HEENT: Normal. Neck: No JVD, no thyromegaly. Lungs: Clear to auscultation bilaterally with normal respiratory effort. CV: Regular rate and rhythm, normal S1/S2, no S3/S4, no murmur. No pretibial or periankle edema.  No carotid bruit.   Abdomen: Soft, nontender, no distention.  Neurologic: Alert and oriented.  Psych: Normal affect. Skin: Normal. Musculoskeletal: No gross deformities.    ECG: Reviewed above under Subjective   Labs: Lab Results  Component Value Date/Time   K 3.6 12/20/2016 09:00 AM   BUN 14 12/20/2016 09:00 AM   CREATININE 0.56 12/20/2016 09:00 AM   ALT 19 12/20/2016 09:00 AM   TSH 1.835 10/29/2015 04:49 PM   HGB 13.0 12/20/2016 09:00 AM     Lipids: No results found for: LDLCALC, LDLDIRECT, CHOL, TRIG, HDL     ASSESSMENT AND PLAN:  1.  Paroxysmal atrial fibrillation: Symptomatically stable overall.  Continue metoprolol 50 mg twice daily.  She takes diltiazem as needed.  Anticoagulated with Xarelto.  We talked about antiarrhythmic therapy but she prefers to stay on Lopressor.  2.  Hypertension: Controlled on present therapy.  No changes.  3.  Hyperlipidemia: Continue statin.   Disposition: Follow up 1 year   Kate Sable, M.D., F.A.C.C.

## 2018-09-26 NOTE — Patient Instructions (Signed)
Medication Instructions:   Your physician recommends that you continue on your current medications as directed. Please refer to the Current Medication list given to you today. If you need a refill on your cardiac medications before your next appointment, please call your pharmacy.   Lab work:  NONE If you have labs (blood work) drawn today and your tests are completely normal, you will receive your results only by: Marland Kitchen MyChart Message (if you have MyChart) OR . A paper copy in the mail If you have any lab test that is abnormal or we need to change your treatment, we will call you to review the results.  Testing/Procedures:  NONE  Follow-Up:  Your physician recommends that you schedule a follow-up appointment in: 1 year. You will receive a reminder letter in the mail in about 10 months reminding you to call and schedule your appointment. If you don't receive this letter, please contact our office. At [facility], you and your health needs are our priority.  As part of our continuing mission to provide you with exceptional heart care, we have created designated Provider Care Teams.  These Care Teams include your primary Cardiologist (physician) and Advanced Practice Providers (APPs -  Physician Assistants and Nurse Practitioners) who all work together to provide you with the care you need, when you need it.   Any Other Special Instructions Will Be Listed Below (If Applicable).

## 2018-10-20 DIAGNOSIS — M85851 Other specified disorders of bone density and structure, right thigh: Secondary | ICD-10-CM | POA: Diagnosis not present

## 2018-10-27 ENCOUNTER — Telehealth: Payer: Self-pay | Admitting: *Deleted

## 2018-10-27 NOTE — Telephone Encounter (Signed)
No contraindications with cardiac meds.

## 2018-10-27 NOTE — Telephone Encounter (Signed)
Pt recently dx with osteopenia and was given rx for fosamax and wanted to check with Dr Bronson Ing that this isn't contraindicated with her cardiac medications

## 2018-10-27 NOTE — Telephone Encounter (Signed)
Pt voiced understanding

## 2018-11-25 DIAGNOSIS — J329 Chronic sinusitis, unspecified: Secondary | ICD-10-CM | POA: Diagnosis not present

## 2018-12-15 ENCOUNTER — Other Ambulatory Visit: Payer: Self-pay | Admitting: Family Medicine

## 2018-12-15 DIAGNOSIS — Z1231 Encounter for screening mammogram for malignant neoplasm of breast: Secondary | ICD-10-CM

## 2018-12-31 DIAGNOSIS — L821 Other seborrheic keratosis: Secondary | ICD-10-CM | POA: Diagnosis not present

## 2018-12-31 DIAGNOSIS — Z85828 Personal history of other malignant neoplasm of skin: Secondary | ICD-10-CM | POA: Diagnosis not present

## 2018-12-31 DIAGNOSIS — L570 Actinic keratosis: Secondary | ICD-10-CM | POA: Diagnosis not present

## 2019-01-26 ENCOUNTER — Ambulatory Visit
Admission: RE | Admit: 2019-01-26 | Discharge: 2019-01-26 | Disposition: A | Discharge status: Home / Self Care | Payer: Medicare Other | Source: Ambulatory Visit | Attending: Family Medicine | Admitting: Family Medicine

## 2019-01-26 DIAGNOSIS — Z1231 Encounter for screening mammogram for malignant neoplasm of breast: Secondary | ICD-10-CM | POA: Diagnosis not present

## 2019-02-02 ENCOUNTER — Telehealth: Payer: Self-pay | Admitting: Cardiovascular Disease

## 2019-02-02 NOTE — Telephone Encounter (Signed)
Pt says since Thursday has had 2 "episodes" of afib lasting 3-5 hours at at time says she was SOB/dizzy/chest tightness during that time - HR reached 130s on Thursday and Sunday 120s - took 1 tablet of diltiazem 30 mg on Thursday and Sunday which seemed to help some - says she typically can "get over" and episode of afib but this time she is still feeling weak and some tightness in her chest - scheduled appt with Bernerd Pho, [place] 2/20 - also will forward to covering provider on how frequently pt can take her prn diltiazem when these episodes happen

## 2019-02-02 NOTE — Telephone Encounter (Signed)
Patient called stating that she is having episodes of atrial fibrillation. States that she fatigue today.

## 2019-02-02 NOTE — Telephone Encounter (Signed)
She can take diltiazem 30 mg 3-4 times a day(every6 hrs) as needed but if in afib for that long and having chest pain should go to ER where they can give her IV medication.She should check her BP after she takes the diltiazem as it may drop it.

## 2019-02-02 NOTE — Telephone Encounter (Signed)
Pt voiced understanding

## 2019-02-05 ENCOUNTER — Encounter: Payer: Self-pay | Admitting: Student

## 2019-02-05 ENCOUNTER — Ambulatory Visit (INDEPENDENT_AMBULATORY_CARE_PROVIDER_SITE_OTHER): Payer: Medicare Other | Admitting: Student

## 2019-02-05 ENCOUNTER — Other Ambulatory Visit (HOSPITAL_COMMUNITY)
Admission: RE | Admit: 2019-02-05 | Discharge: 2019-02-05 | Disposition: A | Discharge status: Home / Self Care | Payer: Medicare Other | Source: Ambulatory Visit | Attending: Student | Admitting: Student

## 2019-02-05 ENCOUNTER — Encounter: Payer: Self-pay | Admitting: *Deleted

## 2019-02-05 VITALS — BP 116/68 | HR 60 | Ht 64.0 in | Wt 170.4 lb

## 2019-02-05 DIAGNOSIS — R002 Palpitations: Secondary | ICD-10-CM | POA: Diagnosis not present

## 2019-02-05 DIAGNOSIS — E785 Hyperlipidemia, unspecified: Secondary | ICD-10-CM

## 2019-02-05 DIAGNOSIS — R079 Chest pain, unspecified: Secondary | ICD-10-CM

## 2019-02-05 DIAGNOSIS — I10 Essential (primary) hypertension: Secondary | ICD-10-CM

## 2019-02-05 DIAGNOSIS — I480 Paroxysmal atrial fibrillation: Secondary | ICD-10-CM | POA: Insufficient documentation

## 2019-02-05 LAB — BASIC METABOLIC PANEL
Anion gap: 9 (ref 5–15)
BUN: 19 mg/dL (ref 8–23)
CO2: 27 mmol/L (ref 22–32)
Calcium: 9.3 mg/dL (ref 8.9–10.3)
Chloride: 102 mmol/L (ref 98–111)
Creatinine, Ser: 0.62 mg/dL (ref 0.44–1.00)
GFR calc Af Amer: >60 mL/min (ref >60)
GFR calc non Af Amer: >60 mL/min (ref >60)
Glucose, Bld: 106 mg/dL — ABNORMAL HIGH (ref 70–99)
Potassium: 4.1 mmol/L (ref 3.5–5.1)
Sodium: 138 mmol/L (ref 135–145)

## 2019-02-05 LAB — CBC WITH DIFFERENTIAL/PLATELET
Abs Immature Granulocytes: 0.04 10*3/uL (ref 0.00–0.07)
Basophils Absolute: 0 10*3/uL (ref 0.0–0.1)
Basophils Relative: 0 %
Eosinophils Absolute: 0.2 10*3/uL (ref 0.0–0.5)
Eosinophils Relative: 2 %
HCT: 42.5 % (ref 36.0–46.0)
Hemoglobin: 13.5 g/dL (ref 12.0–15.0)
Immature Granulocytes: 0 %
Lymphocytes Relative: 35 %
Lymphs Abs: 3.1 10*3/uL (ref 0.7–4.0)
MCH: 29.4 pg (ref 26.0–34.0)
MCHC: 31.8 g/dL (ref 30.0–36.0)
MCV: 92.6 fL (ref 80.0–100.0)
Monocytes Absolute: 0.6 10*3/uL (ref 0.1–1.0)
Monocytes Relative: 7 %
Neutro Abs: 5 10*3/uL (ref 1.7–7.7)
Neutrophils Relative %: 56 %
Platelets: 260 10*3/uL (ref 150–400)
RBC: 4.59 MIL/uL (ref 3.87–5.11)
RDW: 12.7 % (ref 11.5–15.5)
WBC: 9 10*3/uL (ref 4.0–10.5)
nRBC: 0 % (ref 0.0–0.2)

## 2019-02-05 LAB — MAGNESIUM: Magnesium: 2.1 mg/dL (ref 1.7–2.4)

## 2019-02-05 LAB — TSH: TSH: 1.692 u[IU]/mL (ref 0.350–4.500)

## 2019-02-05 NOTE — Patient Instructions (Signed)
Medication Instructions:  Your physician recommends that you continue on your current medications as directed. Please refer to the Current Medication list given to you today.  If you need a refill on your cardiac medications before your next appointment, please call your pharmacy.   Lab work: Your physician recommends that you return for lab work in: Today   If you have labs (blood work) drawn today and your tests are completely normal, you will receive your results only by: Marland Kitchen MyChart Message (if you have MyChart) OR . A paper copy in the mail If you have any lab test that is abnormal or we need to change your treatment, we will call you to review the results.  Testing/Procedures: Your physician has requested that you have a lexiscan myoview. For further information please visit [email or web address]. Please follow instruction sheet, as given.    Follow-Up: At [facility], you and your health needs are our priority.  As part of our continuing mission to provide you with exceptional heart care, we have created designated Provider Care Teams.  These Care Teams include your primary Cardiologist (physician) and Advanced Practice Providers (APPs -  Physician Assistants and Nurse Practitioners) who all work together to provide you with the care you need, when you need it. You will need a follow up appointment in 3-4 months.  Please call our office 2 months in advance to schedule this appointment.  You may see Kate Sable, MD or one of the following Advanced Practice Providers on your designated Care Team:   Bernerd Pho, PA-C [facility]) . Ermalinda Barrios, PA-C ([place])  Any Other Special Instructions Will Be Listed Below (If Applicable). Thank you for choosing [facility]!

## 2019-02-05 NOTE — Progress Notes (Signed)
Cardiology Office Note    Date:  02/05/2019   ID:  Tamico, Mundo August 19, 1951, MRN [id number]  PCP:  [name], MD  Cardiologist: Kate Sable, MD    Chief Complaint  Patient presents with  . Follow-up    recent palpitations    History of Present Illness:    Heather Cunningham is a 68 y.o. female with past medical history of paroxysmal atrial fibrillation, HTN, and HLD who presents to the office today for follow-up in regards to recent palpitations.  She was last examined by Dr. Bronson Ing in 09/2018 and reported having frequent palpitations but denied any recent chest pain or dyspnea on exertion. She was continued on her current medication regimen including Lopressor 50 BID and PRN Cardizem along with Xarelto for anticoagulation.  She called the office on 02/02/2019 reporting two episodes of atrial fibrillation within the past week which would last for 3 to 5 hours at a time. Reported associated dizziness, chest pain, and shortness of breath when this would occur. She had utilized Cardizem with improvement in her symptoms. Was recommended she take this Q6H as needed for her symptoms but if having chest pain, then proceed to the ED for further evaluation.   In talking with the patient today, she reports having more frequent episodes of atrial fibrillation over the past 6+ months. She is a retired [employer] scientist and is able to check her heart rate when these episodes occur and reports this becomes elevated in the 120's to 130's and sustains at that rate for hours. She was previously only having episodes 1-2 times per year. Last Thursday, she developed an episode for which she took short acting Cardizem and returned back to normal sinus rhythm within 2 to 3 hours. On Sunday, she had a recurrent event and felt like she was in atrial fibrillation for 5+ hours despite having taken her regularly scheduled Lopressor and PRN Cardizem. She denies any recurrent palpitations since. Says that she has felt  "drained" since last week. Also reports feeling a discomfort along her sternum and left pectoral region which has been constant since onset but does slightly worsen with exertion. Says her pain is a 1/10 at rest, but becomes a 3/10 with activity such as walking her dog.  No recent orthopnea, PND, or lower extremity edema.  Reports good compliance with Xarelto and denies any evidence of active bleeding.  Past Medical History:  Diagnosis Date  . Arthritis    back  . Asthma    reactive airway around cats and dogs  . Bilateral carpal tunnel syndrome   . Complication of anesthesia   . GERD (gastroesophageal reflux disease)   . Glaucoma   . Hypertension   . Paroxysmal atrial fibrillation (HCC)   . PONV (postoperative nausea and vomiting)   . Premature ventricular contraction     Past Surgical History:  Procedure Laterality Date  . BREAST EXCISIONAL BIOPSY Left 1996  . BREAST EXCISIONAL BIOPSY Right 1993  . BREAST LUMPECTOMY    . CATARACT EXTRACTION    . COLONOSCOPY N/A 09/05/2015   Procedure: COLONOSCOPY;  Surgeon: [name], MD;  [facility];  Service: Endoscopy;  Laterality: N/A;  incomplete colonoscopy  . COLONOSCOPY WITH PROPOFOL N/A 11/01/2015   HDQ:[id number] hemorrhoids/five polyps/mild diverticulosis  . EYE SURGERY    . HYSTEROSCOPY W/D&C N/A 12/31/2016   Procedure: DILATATION AND CURETTAGE /HYSTEROSCOPY, POSSIBLE REMOVAL OF ENDOMETRIAL LESION;  Surgeon: [name], MD;  Location: [facility];  Service: Gynecology;  Laterality: N/A;  . Maxiofacial    . POLYPECTOMY  11/01/2015   Procedure: POLYPECTOMY;  Surgeon: [name], MD;  Location: [facility];  Service: Endoscopy;;  . [place]  . TUBAL LIGATION      Current Medications: Outpatient Medications Prior to Visit  Medication Sig Dispense Refill  . acetaminophen (TYLENOL) 500 MG tablet Take 1,000 mg by mouth every 6 (six) hours as needed for mild pain.    Marland Kitchen albuterol (PROAIR HFA)  108 (90 BASE) MCG/ACT inhaler Inhale 2 puffs into the lungs every 6 (six) hours as needed for wheezing or shortness of breath.     . calcium carbonate (TUMS - DOSED IN MG ELEMENTAL CALCIUM) 500 MG chewable tablet Chew 1 tablet by mouth every other day. Alternates between tums and omeprazole    . Calcium Carbonate-Vitamin D (CALTRATE 600+D) 600-400 MG-UNIT per tablet Take 1 tablet by mouth daily.     . CV[employer] FIBER GUMMIES PO Take 1 tablet by mouth daily.    Marland Kitchen diltiazem (CARDIZEM) 30 MG tablet Take 1 tablet (30 mg total) by mouth daily as needed (Palpitations). (Patient taking differently: Take 30 mg by mouth 4 (four) times daily. ) 30 tablet 6  . docusate sodium (COLACE) 100 MG capsule Take 100 mg by mouth daily.    . dorzolamide (TRUSOPT) 2 % ophthalmic solution Place 1 drop into the right eye 2 (two) times daily.    Marland Kitchen latanoprost (XALATAN) 0.005 % ophthalmic solution Place 1 drop into both eyes at bedtime.    Marland Kitchen lisinopril-hydrochlorothiazide (PRINZIDE,ZESTORETIC) 20-25 MG tablet Take 1 tablet by mouth at bedtime.     . Meclizine HCl (MEDI-MECLIZINE PO) Take by mouth.    . Melatonin 3 MG CAPS Take 1 capsule by mouth at bedtime as needed (sleep).     . metoprolol tartrate (LOPRESSOR) 50 MG tablet Take 1 tablet (50 mg total) by mouth 2 (two) times daily. 180 tablet 3  . omeprazole (PRILOSEC) 20 MG capsule Take 40 mg by mouth every other day. Alternates between Tums and omeprazole every other day     . rivaroxaban (XARELTO) 20 MG TABS tablet TAKE 1 TABLET BY MOUTH DAILY WITH SUPPER. 30 tablet 6  . simvastatin (ZOCOR) 20 MG tablet Take 20 mg by mouth daily at 6 PM.      No facility-administered medications prior to visit.      Allergies:   Other   Social History   Socioeconomic History  . Marital status: Married    Spouse name: [name]  . Number of children: 1  . Years of education: Not on file  . Highest education level: Not on file  Occupational History  . Occupation: [employer]    Employer: OTHER  Social Needs  . Financial resource strain: Not on file  . Food insecurity:    Worry: Not on file    Inability: Not on file  . Transportation needs:    Medical: Not on file    Non-medical: Not on file  Tobacco Use  . Smoking status: Former Smoker    Packs/day: 1.00    Years: 10.00    Pack years: 10.00    Types: Cigarettes    Start date: 12/17/1966    Last attempt to quit: 12/17/1976    Years since quitting: 42.1  . Smokeless tobacco: Never Used  . Tobacco comment: QUIT 35 YEARS AGO  Substance and Sexual Activity  . Alcohol use: No  Alcohol/week: 0.0 standard drinks  . Drug use: No  . Sexual activity: Not on file  Lifestyle  . Physical activity:    Days per week: Not on file    Minutes per session: Not on file  . Stress: Not on file  Relationships  . Social connections:    Talks on phone: Not on file    Gets together: Not on file    Attends religious service: Not on file    Active member of club or organization: Not on file    Attends meetings of clubs or organizations: Not on file    Relationship status: Not on file  Other Topics Concern  . Not on file  Social History Narrative   Pt gets regular exercise. 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 WORKED FOR [employer]. KIDS: [place], [place] WAS IN THE [employer].     Family History:  The patient's family history includes Cancer in an other family member; Colon cancer in her maternal grandmother and mother; Heart failure in an other family member.   Review of Systems:   Please see the history of present illness.     General:  No chills, fever, night sweats or weight changes.  Cardiovascular:  No dyspnea on exertion, edema, orthopnea,  paroxysmal nocturnal dyspnea. Positive for palpitations and chest pain.  Dermatological: No rash, lesions/masses Respiratory: No cough, dyspnea Urologic: No hematuria, dysuria Abdominal:   No nausea, vomiting, diarrhea, bright red  blood per rectum, melena, or hematemesis Neurologic:  No visual changes, wkns, changes in mental status. All other systems reviewed and are otherwise negative except as noted above.   Physical Exam:    VS:  BP 116/68 (BP Location: Right Arm)   Pulse 60   Ht 5\' 4"  (1.626 m)   Wt 170 lb 6.4 oz (77.3 kg)   SpO2 97%   BMI 29.25 kg/m    General: Well developed, well nourished Caucasian female appearing in no acute distress. Head: Normocephalic, atraumatic, sclera non-icteric, no xanthomas, nares are without discharge.  Neck: No carotid bruits. JVD not elevated.  Lungs: Respirations regular and unlabored, without wheezes or rales.  Heart: Regular rate and rhythm. No S3 or S4.  No murmur, no rubs, or gallops appreciated. Abdomen: Soft, non-tender, non-distended with normoactive bowel sounds. No hepatomegaly. No rebound/guarding. No obvious abdominal masses. Msk:  Strength and tone appear normal for age. No joint deformities or effusions. Extremities: No clubbing or cyanosis. No lower extremity edema.  Distal pedal pulses are 2+ bilaterally. Neuro: Alert and oriented X 3. Moves all extremities spontaneously. No focal deficits noted. Psych:  Responds to questions appropriately with a normal affect. Skin: No rashes or lesions noted  Wt Readings from Last 3 Encounters:  02/05/19 170 lb 6.4 oz (77.3 kg)  09/26/18 174 lb 6.4 oz (79.1 kg)  09/05/17 165 lb (74.8 kg)     Studies/Labs Reviewed:   EKG:  EKG is ordered today.  The ekg ordered today demonstrates sinus bradycardia, HR 58, with no acute ST changes   Recent Labs: 02/05/2019: BUN 19; Creatinine, Ser 0.62; Hemoglobin 13.5; Magnesium 2.1; Platelets 260; Potassium 4.1; Sodium 138; TSH 1.692   Lipid Panel No results found for: CHOL, TRIG, HDL, CHOLHDL, VLDL, LDLCALC, LDLDIRECT  Additional studies/ records that were reviewed today include:   Echocardiogram: 11/2015 Study Conclusions  - Left ventricle: The cavity size was normal.  There was mild   concentric hypertrophy. Systolic function was normal. The   estimated ejection  fraction was in the range of 60% to 65%. Wall   motion was normal; there were no regional wall motion   abnormalities. Left ventricular diastolic function parameters   were normal. - Aortic valve: Trileaflet; normal thickness leaflets. There was no   regurgitation. - Aortic root: The aortic root was normal in size. - Ascending aorta: The ascending aorta was normal in size. - Mitral valve: Structurally normal valve. There was no   regurgitation. - Left atrium: The atrium was normal in size. - Right ventricle: The cavity size was normal. Wall thickness was   normal. Systolic function was normal. - Right atrium: The atrium was normal in size. - Tricuspid valve: There was trivial regurgitation. - Pulmonic valve: There was no regurgitation. - Pulmonary arteries: Systolic pressure was within the normal   range. - Inferior vena cava: The vessel was normal in size. - Pericardium, extracardiac: There was no pericardial effusion.  Impressions:  - Normal study.  NST: 11/2015  Nuclear stress EF: 62%.  There was no ST segment deviation noted during stress.  The study is normal.  This is a low risk study.  The left ventricular ejection fraction is normal (55-65%).   No ischemia. LV function is normal with EF 62% and no wall motion abnormalities. This is similar to stress echo of 2009 with EF 60-65%  Assessment:    1. Paroxysmal atrial fibrillation (HCC)   2. Palpitations   3. Chest pain, unspecified type   4. Essential hypertension   5. Hyperlipidemia, unspecified hyperlipidemia type      Plan:   In order of problems listed above:  1. Paroxysmal Atrial Fibrillation/Palpitations - She has a known history of paroxysmal atrial fibrillation and was previously only having one episode every 1 to 2 years but has now started to experience more frequent events. Has experienced 2  episodes within the past week which lasted for 5+ hours at a time and resolved with the use of PRN Cardizem. - Given her more frequent episodes, will plan to obtain basic lab work today including CBC, BMET, TSH, and Mg. Her EKG today shows that she is in sinus bradycardia and this does not allow for further titration of her Lopressor. Will continue at 50 mg twice daily and continue to use short acting Cardizem on an as-needed basis. Did review the option of antiarrhythmic therapy with the patient and she had previously declined this but would now consider if she continues to have frequent events. I encouraged her to call our office if she does, as referral to EP could be arranged. - She denies any evidence of active bleeding and remains on Xarelto for anticoagulation. Recheck CBC today.  2. Chest Pain - She reports having episodes of chest discomfort starting last week around the time of her episodes of atrial fibrillation. Although she has been in NSR for the past few days, she still describes a constant aching sensation along her left pectoral region. Symptoms are slightly worse with exertion but sometimes exacerbated with turning from side to side. Her pain is not reproducible on examination today. EKG shows no acute ischemic changes. - Given the time since her last stress test and worsening symptoms, will plan for a Lexiscan Myoview for ischemic evaluation.  3. HTN - BP is well controlled at 116/68 during today's visit. Continue Lopressor 50 mg twice daily and Lisinopril-HCTZ 20-25 mg daily.  4. HLD - Followed by PCP. She remains on Simvastatin 20 mg daily.   Medication Adjustments/Labs and Tests Ordered:  Current medicines are reviewed at length with the patient today.  Concerns regarding medicines are outlined above.  Medication changes, Labs and Tests ordered today are listed in the Patient Instructions below. Patient Instructions  Medication Instructions:  Your physician recommends that you  continue on your current medications as directed. Please refer to the Current Medication list given to you today.  If you need a refill on your cardiac medications before your next appointment, please call your pharmacy.   Lab work: Your physician recommends that you return for lab work in: Today   If you have labs (blood work) drawn today and your tests are completely normal, you will receive your results only by: Marland Kitchen MyChart Message (if you have MyChart) OR . A paper copy in the mail If you have any lab test that is abnormal or we need to change your treatment, we will call you to review the results.  Testing/Procedures: Your physician has requested that you have a lexiscan myoview. For further information please visit [email or web address]. Please follow instruction sheet, as given.    Follow-Up: At [facility], you and your health needs are our priority.  As part of our continuing mission to provide you with exceptional heart care, we have created designated Provider Care Teams.  These Care Teams include your primary Cardiologist (physician) and Advanced Practice Providers (APPs -  Physician Assistants and Nurse Practitioners) who all work together to provide you with the care you need, when you need it. You will need a follow up appointment in 3-4 months.  Please call our office 2 months in advance to schedule this appointment.  You may see Kate Sable, MD or one of the following Advanced Practice Providers on your designated Care Team:   Bernerd Pho, PA-C [facility]) . Ermalinda Barrios, PA-C ([place])  Any Other Special Instructions Will Be Listed Below (If Applicable). Thank you for choosing [facility]!    Signed, Erma Heritage, PA-C  02/05/2019 4:29 PM    Heather [address]. [address] [place], [place] [address] Phone: [phone] Fax: [phone]

## 2019-02-13 ENCOUNTER — Encounter (HOSPITAL_COMMUNITY)
Admission: RE | Admit: 2019-02-13 | Discharge: 2019-02-13 | Disposition: A | Discharge status: Home / Self Care | Payer: Medicare Other | Source: Ambulatory Visit | Attending: Nurse Practitioner | Admitting: Nurse Practitioner

## 2019-02-13 ENCOUNTER — Encounter (HOSPITAL_BASED_OUTPATIENT_CLINIC_OR_DEPARTMENT_OTHER)
Admission: RE | Admit: 2019-02-13 | Discharge: 2019-02-13 | Disposition: A | Discharge status: Home / Self Care | Payer: Medicare Other | Source: Ambulatory Visit | Attending: Student | Admitting: Student

## 2019-02-13 ENCOUNTER — Encounter (HOSPITAL_COMMUNITY): Payer: Self-pay

## 2019-02-13 DIAGNOSIS — R079 Chest pain, unspecified: Secondary | ICD-10-CM | POA: Insufficient documentation

## 2019-02-13 HISTORY — DX: Malignant (primary) neoplasm, unspecified: C80.1

## 2019-02-13 LAB — NM MYOCAR MULTI W/SPECT W/WALL MOTION / EF
LV dias vol: 90 mL (ref 46–106)
LV sys vol: 32 mL
Peak HR: 77 {beats}/min
RATE: 0.5
Rest HR: 54 {beats}/min
SDS: 0
SRS: 1
SSS: 1
TID: 1.06

## 2019-02-13 MED ORDER — REGADENOSON 0.4 MG/5ML IV SOLN
INTRAVENOUS | Status: AC
  Administered 2019-02-13: 0.4 mg via INTRAVENOUS
  Filled 2019-02-13: qty 5

## 2019-02-13 MED ORDER — SODIUM CHLORIDE 0.9% FLUSH
INTRAVENOUS | Status: AC
  Administered 2019-02-13: 10 mL via INTRAVENOUS
  Filled 2019-02-13: qty 10

## 2019-02-13 MED ORDER — TECHNETIUM TC 99M TETROFOSMIN IV KIT
10.0000 | PACK | Freq: Once | INTRAVENOUS | Status: AC | PRN
  Administered 2019-02-13: 10 via INTRAVENOUS

## 2019-02-13 MED ORDER — TECHNETIUM TC 99M TETROFOSMIN IV KIT
30.0000 | PACK | Freq: Once | INTRAVENOUS | Status: AC | PRN
  Administered 2019-02-13: 33 via INTRAVENOUS

## 2019-02-23 DIAGNOSIS — H401131 Primary open-angle glaucoma, bilateral, mild stage: Secondary | ICD-10-CM | POA: Diagnosis not present

## 2019-03-25 ENCOUNTER — Other Ambulatory Visit: Payer: Self-pay

## 2019-03-25 ENCOUNTER — Ambulatory Visit (INDEPENDENT_AMBULATORY_CARE_PROVIDER_SITE_OTHER): Payer: Medicare Other | Admitting: Gastroenterology

## 2019-03-25 DIAGNOSIS — K219 Gastro-esophageal reflux disease without esophagitis: Secondary | ICD-10-CM

## 2019-03-25 DIAGNOSIS — D126 Benign neoplasm of colon, unspecified: Secondary | ICD-10-CM | POA: Diagnosis not present

## 2019-03-25 DIAGNOSIS — K648 Other hemorrhoids: Secondary | ICD-10-CM | POA: Diagnosis not present

## 2019-03-25 DIAGNOSIS — I480 Paroxysmal atrial fibrillation: Secondary | ICD-10-CM | POA: Diagnosis not present

## 2019-03-25 NOTE — Patient Instructions (Addendum)
SEE CARDIOLOGY TO GET BETTER CONTROL OF YOUR HEART RATE.  WE WILL SCHEDULE YOUR COLONOSCOPY IN SEP 2020. YOU WILL REQUIRE A PREOP VISIT AT [facility] PRIOR TO YOUR COLONOSCOPY. HOLD XARELTO 2 DAYS PRIOR TO YOUR COLONOSCOPY.  CONTINUE COLACE AND OMEPRAZOLE.  FOLLOW UP IN THE OFFICE WILL BE SCHEDULED IF NEEDED AFTER ENDOSCOPY.

## 2019-03-25 NOTE — Assessment & Plan Note (Signed)
NO WARNING SIGNS/SYMPTOMS. NEEDS MAC DUE TO FAILED CONSCIOUS SEDATION IN 2007.   DISCUSSED PROCEDURE, MANAGEMENT OPTIONS FOR PMHx: COLON ADENOMAS IN PT WITH NEED FOR XARELTO, BENEFITS, & PREP. SEE CARDIOLOGY TO GET BETTER CONTROL OF YOUR HEART RATE. WE WILL SCHEDULE YOUR COLONOSCOPY IN SEP 2020. YOU WILL REQUIRE A PREOP VISIT AT [facility] PRIOR TO YOUR COLONOSCOPY. HOLD XARELTO 2 DAYS PRIOR TO YOUR COLONOSCOPY. FOLLOW UP IN THE OFFICE WILL BE SCHEDULED IF NEEDED AFTER ENDOSCOPY.

## 2019-03-25 NOTE — Assessment & Plan Note (Signed)
SYMPTOMS NOT IDEALLY CONTROLLED.  SEE CARDIOLOGY TO GET BETTER CONTROL OF YOUR HEART RATE. WILL SCHEDULE YOUR COLONOSCOPY IN SEP 2020. YOU WILL REQUIRE A PREOP VISIT AT [facility] PRIOR TO YOUR COLONOSCOPY. HOLD XARELTO 2 DAYS PRIOR TO YOUR COLONOSCOPY. FOLLOW UP IN THE OFFICE WILL BE SCHEDULED IF NEEDED AFTER ENDOSCOPY.

## 2019-03-25 NOTE — Assessment & Plan Note (Signed)
SYMPTOMS FAIRLY WELL CONTROLLED ON OMEPRAZOLE ALTERNATING WITH TUMS.  CONTINUE COLACE AND OMEPRAZOLE. FOLLOW UP IN THE OFFICE WILL BE SCHEDULED IF NEEDED AFTER ENDOSCOPY.

## 2019-03-25 NOTE — Assessment & Plan Note (Signed)
SYMPTOMS CONTROLLED/RESOLVED.  CONTINUE TO MONITOR SYMPTOMS. 

## 2019-03-25 NOTE — Progress Notes (Signed)
Subjective:    Patient ID: Heather Cunningham, female    DOB: February 13, 1951, 68 y.o.   MRN: [id number]  Heather Percy, MD  HPI HAS HAD INCREASE IN EPISODE OF AFIB. CAN FEEL IT IN HER THROAT. TAKES PULSE: HR UP TO 150. TAKES CZM IF SHE HAS AN EPISODE AND CONVERTS. USING CZM IN PAST FEW MOS: ABOUT 1-2X/MO. JUST COMPLETED CHEMICAL BLOOD TEST.SEE CARDS JUN 2020. BMs: EVERY DAY. APPETITE: TOO GOOD. COLACE HELPS HER AVOID CONSTIPATION. OMEPRAZOLE TAKES IT QOD WITH TUMS AND HEARTBURN PRETTY WELL CONTROLLED.  PT DENIES FEVER, CHILLS, HEMATOCHEZIA, HEMATEMESIS, nausea, vomiting, melena, diarrhea, CHEST PAIN, SHORTNESS OF BREATH, CHANGE IN BOWEL IN HABITS, abdominal pain, problems swallowing, OR. problems with sedation.  Past Medical History:  Diagnosis Date  . Arthritis    back  . Asthma    reactive airway around cats and dogs  . Bilateral carpal tunnel syndrome   . Cancer (HCC)    Skin  . Complication of anesthesia   . GERD (gastroesophageal reflux disease)   . Glaucoma   . Hypertension   . Paroxysmal atrial fibrillation (HCC)   . PONV (postoperative nausea and vomiting)   . Premature ventricular contraction    Past Surgical History:  Procedure Laterality Date  . BREAST EXCISIONAL BIOPSY Left 1996  . BREAST EXCISIONAL BIOPSY Right 1993  . BREAST LUMPECTOMY    . CATARACT EXTRACTION    . COLONOSCOPY N/A 09/05/2015   Procedure: COLONOSCOPY;  Surgeon: [name], MD;  [facility];  Service: Endoscopy;  Laterality: N/A;  incomplete colonoscopy  . COLONOSCOPY WITH PROPOFOL N/A 11/01/2015   XIP:[id number] hemorrhoids/five polyps/mild diverticulosis  . EYE SURGERY    . HYSTEROSCOPY W/D&C N/A 12/31/2016   Procedure: DILATATION AND CURETTAGE /HYSTEROSCOPY, POSSIBLE REMOVAL OF ENDOMETRIAL LESION;  Surgeon: [name], MD;  Location: [facility];  Service: Gynecology;  Laterality: N/A;  . Maxiofacial    . POLYPECTOMY  11/01/2015   Procedure: POLYPECTOMY;  Surgeon: [name], MD;   Location: [facility];  Service: Endoscopy;;  . [place]  . TUBAL LIGATION     Allergies  Allergen Reactions  . Other     CANNOT RECEIVE AN MRI DUE TO METAL BAND (RIGHT EYE) DUE TO DETATCHED RETINA     Current Outpatient Medications  Medication Sig    . acetaminophen (TYLENOL) 500 MG tablet Take 1,000 mg by mouth every 6 (six) hours as needed for mild pain.    Marland Kitchen albuterol (PROAIR HFA) 108 (90 BASE) MCG/ACT inhaler Inhale 2 puffs into the lungs every 6 (six) hours as needed for wheezing or shortness of breath.     . calcium carbonate (TUMS - DOSED IN MG ELEMENTAL CALCIUM) 500 MG chewable tablet Chew 1 tablet by mouth every other day. Alternates between tums and omeprazole    . Calcium Carbonate-Vitamin D (CALTRATE 600+D) 600-400 MG-UNIT per tablet Take 1 tablet by mouth daily.     Marland Kitchen diltiazem (CARDIZEM) 30 MG tablet Take 1 tablet (30 mg total) by mouth daily as needed (Palpitations). (Patient taking differently: Take 30 mg by mouth 4 (four) times daily. )    . docusate sodium (COLACE) 100 MG capsule Take 100 mg by mouth 2 (two) times daily.     . dorzolamide (TRUSOPT) 2 % ophthalmic solution Place 1 drop into the right eye 2 (two) times daily.    Marland Kitchen latanoprost (XALATAN) 0.005 % ophthalmic solution Place 1 drop into both eyes at bedtime.    Marland Kitchen  lisinopril-hydrochlorothiazide (PRINZIDE,ZESTORETIC) 20-25 MG tablet Take 1 tablet by mouth at bedtime.     . Meclizine HCl (MEDI-MECLIZINE PO) Take by mouth as needed.     . Melatonin 3 MG CAPS Take 1 capsule by mouth at bedtime as needed (sleep).     . metoprolol tartrate (LOPRESSOR) 50 MG tablet Take 1 tablet (50 mg total) by mouth 2 (two) times daily.    Marland Kitchen omeprazole (PRILOSEC) 20 MG capsule Take 40 mg by mouth every other day. Alternates between Tums and omeprazole every other day     . rivaroxaban (XARELTO) 20 MG TABS tablet TAKE 1 TABLET BY MOUTH DAILY WITH SUPPER.    . simvastatin (ZOCOR) 20 MG tablet Take 20 mg by mouth  daily at 6 PM.     . CVS FIBER GUMMIES PO Take 1 tablet by mouth daily.     Review of Systems PER HPI OTHERWISE ALL SYSTEMS ARE NEGATIVE.    Objective:   Physical Exam  TELEPHONE VISIT DUE TO COVID 19, VISIT IS CONDUCTED VIRTUALLY AND WAS REQUESTED BY PATIENT.      Assessment & Plan:

## 2019-03-26 NOTE — Progress Notes (Signed)
cc'ed to pcp °

## 2019-05-15 ENCOUNTER — Telehealth: Payer: Self-pay | Admitting: Cardiovascular Disease

## 2019-05-15 NOTE — Telephone Encounter (Signed)
Virtual Visit Pre-Appointment Phone Call  "(Name), I am calling you today to discuss your upcoming appointment. We are currently trying to limit exposure to the virus that causes COVID-19 by seeing patients at home rather than in the office."  1. "What is the BEST phone number to call the day of the visit?" - include this in appointment notes  2. Do you have or have access to (through a family member/friend) a smartphone with video capability that we can use for your visit?" a. If yes - list this number in appt notes as cell (if different from BEST phone #) and list the appointment type as a VIDEO visit in appointment notes b. If no - list the appointment type as a PHONE visit in appointment notes  3. Confirm consent - "In the setting of the current Covid19 crisis, you are scheduled for a (phone or video) visit with your provider on (date) at (time).  Just as we do with many in-office visits, in order for you to participate in this visit, we must obtain consent.  If you'd like, I can send this to your mychart (if signed up) or email for you to review.  Otherwise, I can obtain your verbal consent now.  All virtual visits are billed to your insurance company just like a normal visit would be.  By agreeing to a virtual visit, we'd like you to understand that the technology does not allow for your provider to perform an examination, and thus may limit your provider's ability to fully assess your condition. If your provider identifies any concerns that need to be evaluated in person, we will make arrangements to do so.  Finally, though the technology is pretty good, we cannot assure that it will always work on either your or our end, and in the setting of a video visit, we may have to convert it to a phone-only visit.  In either situation, we cannot ensure that we have a secure connection.  Are you willing to proceed?" STAFF: Did the patient verbally acknowledge consent to telehealth visit? Document  YES/NO here: Yes  4. Advise patient to be prepared - "Two hours prior to your appointment, go ahead and check your blood pressure, pulse, oxygen saturation, and your weight (if you have the equipment to check those) and write them all down. When your visit starts, your provider will ask you for this information. If you have an Apple Watch or Kardia device, please plan to have heart rate information ready on the day of your appointment. Please have a pen and paper handy nearby the day of the visit as well."  5. Give patient instructions for MyChart download to smartphone OR [email or web address] as below if video visit (depending on what platform provider is using)  6. Inform patient they will receive a phone call 15 minutes prior to their appointment time (may be from unknown caller ID) so they should be prepared to answer    TELEPHONE CALL NOTE  Heather Cunningham has been deemed a candidate for a follow-up tele-health visit to limit community exposure during the Covid-19 pandemic. I spoke with the patient via phone to ensure availability of phone/video source, confirm preferred email & phone number, and discuss instructions and expectations.  I reminded Heather Cunningham to be prepared with any vital sign and/or heart rhythm information that could potentially be obtained via home monitoring, at the time of her visit. I reminded Heather Cunningham to expect a phone call prior to  her visit.  Bertram Gala Goins 05/15/2019 10:27 AM

## 2019-05-28 ENCOUNTER — Telehealth: Payer: Self-pay | Admitting: Cardiovascular Disease

## 2019-05-28 ENCOUNTER — Telehealth (INDEPENDENT_AMBULATORY_CARE_PROVIDER_SITE_OTHER): Payer: Medicare Other | Admitting: Cardiovascular Disease

## 2019-05-28 ENCOUNTER — Encounter: Payer: Self-pay | Admitting: Cardiovascular Disease

## 2019-05-28 VITALS — BP 110/68 | HR 62 | Ht 64.0 in | Wt 169.0 lb

## 2019-05-28 DIAGNOSIS — I10 Essential (primary) hypertension: Secondary | ICD-10-CM

## 2019-05-28 DIAGNOSIS — R079 Chest pain, unspecified: Secondary | ICD-10-CM

## 2019-05-28 DIAGNOSIS — I480 Paroxysmal atrial fibrillation: Secondary | ICD-10-CM

## 2019-05-28 DIAGNOSIS — R002 Palpitations: Secondary | ICD-10-CM | POA: Diagnosis not present

## 2019-05-28 DIAGNOSIS — E785 Hyperlipidemia, unspecified: Secondary | ICD-10-CM

## 2019-05-28 NOTE — Patient Instructions (Signed)
Medication Instructions:  Continue all current medications.  Labwork: none  Testing/Procedures:  Your physician has recommended that you wear a 2 week event monitor. Event monitors are medical devices that record the heart's electrical activity. Doctors most often [place] these monitors to diagnose arrhythmias. Arrhythmias are problems with the speed or rhythm of the heartbeat. The monitor is a small, portable device. You can wear one while you do your normal daily activities. This is usually used to diagnose what is causing palpitations/syncope (passing out).  Office will contact with results via phone or letter.    Follow-Up: 3 months   Any Other Special Instructions Will Be Listed Below (If Applicable).  If you need a refill on your cardiac medications before your next appointment, please call your pharmacy.

## 2019-05-28 NOTE — Addendum Note (Signed)
Addended by: Laurine Blazer on: 05/28/2019 09:40 AM   Modules accepted: Orders

## 2019-05-28 NOTE — Progress Notes (Signed)
Virtual Visit via Telephone Note   This visit type was conducted due to national recommendations for restrictions regarding the COVID-19 Pandemic (e.g. social distancing) in an effort to limit this patient's exposure and mitigate transmission in our community.  Due to her co-morbid illnesses, this patient is at least at moderate risk for complications without adequate follow up.  This format is felt to be most appropriate for this patient at this time.  The patient did not have access to video technology/had technical difficulties with video requiring transitioning to audio format only (telephone).  All issues noted in this document were discussed and addressed.  No physical exam could be performed with this format.  Please refer to the patient's chart for her  consent to telehealth for [facility].   Date:  05/28/2019   ID:  Heather Cunningham, DOB 1951-10-30, MRN [id number]  Patient Location: Home Provider Location: Office  PCP:  [name], MD  Cardiologist:  Kate Sable, MD  Electrophysiologist:  None   Evaluation Performed:  Follow-Up Visit  Chief Complaint: Paroxysmal atrial fibrillation  History of Present Illness:    Heather Cunningham is a 68 y.o. female with paroxysmal atrial fibrillation.  She had an office visit on 02/05/2019 due to increased frequency of palpitations.  Labs were checked.  ECG showed sinus bradycardia so the dose of Lopressor could not be increased.  Antiarrhythmic therapy was discussed with her and she was told an EP referral could be arranged.  She had also been complaining of chest pain and a nuclear stress test was ordered.  Nuclear stress test on 02/13/2019 was normal, EF 64%.  Labs were unremarkable.  Chest pain has subsided altogether.  She has been having palpitations, more so at night when she feels she is more aware of her heart.  She has had less episodes of atrial fibrillation, most recently a month ago.  This episode awoke her from sleep.  She last  wore a cardiac monitor in October 2016 which showed PACs and PVCs.  The patient does not have symptoms concerning for COVID-19 infection (fever, chills, cough, or new shortness of breath).    Past Medical History:  Diagnosis Date  . Arthritis    back  . Asthma    reactive airway around cats and dogs  . Bilateral carpal tunnel syndrome   . Cancer (HCC)    Skin  . Complication of anesthesia   . GERD (gastroesophageal reflux disease)   . Glaucoma   . Hypertension   . Paroxysmal atrial fibrillation (HCC)   . PONV (postoperative nausea and vomiting)   . Premature ventricular contraction    Past Surgical History:  Procedure Laterality Date  . BREAST EXCISIONAL BIOPSY Left 1996  . BREAST EXCISIONAL BIOPSY Right 1993  . BREAST LUMPECTOMY    . CATARACT EXTRACTION    . COLONOSCOPY N/A 09/05/2015   Procedure: COLONOSCOPY;  Surgeon: [name], MD;  [facility];  Service: Endoscopy;  Laterality: N/A;  incomplete colonoscopy  . COLONOSCOPY WITH PROPOFOL N/A 11/01/2015   [id number] hemorrhoids/five polyps/mild diverticulosis  . EYE SURGERY    . HYSTEROSCOPY W/D&C N/A 12/31/2016   Procedure: DILATATION AND CURETTAGE /HYSTEROSCOPY, POSSIBLE REMOVAL OF ENDOMETRIAL LESION;  Surgeon: [name], MD;  Location: [facility];  Service: Gynecology;  Laterality: N/A;  . Maxiofacial    . POLYPECTOMY  11/01/2015   Procedure: POLYPECTOMY;  Surgeon: [name], MD;  Location: [facility];  Service: Endoscopy;;  . RETINAL DETACHMENT SURGERY  1997  1999  . TUBAL LIGATION       Current Meds  Medication Sig  . acetaminophen (TYLENOL) 500 MG tablet Take 1,000 mg by mouth every 6 (six) hours as needed for mild pain.  Marland Kitchen albuterol (PROAIR HFA) 108 (90 BASE) MCG/ACT inhaler Inhale 2 puffs into the lungs every 6 (six) hours as needed for wheezing or shortness of breath.   . calcium carbonate (TUMS - DOSED IN MG ELEMENTAL CALCIUM) 500 MG chewable tablet Chew 1 tablet by mouth every other day.  Alternates between tums and omeprazole  . Calcium Carbonate-Vitamin D (CALTRATE 600+D) 600-400 MG-UNIT per tablet Take 1 tablet by mouth daily.   . [employer] FIBER GUMMIES PO Take 1 tablet by mouth daily.  Marland Kitchen diltiazem (CARDIZEM) 30 MG tablet Take 1 tablet (30 mg total) by mouth daily as needed (Palpitations). (Patient taking differently: Take 30 mg by mouth 4 (four) times daily. )  . docusate sodium (COLACE) 100 MG capsule Take 100 mg by mouth 2 (two) times daily.   . dorzolamide (TRUSOPT) 2 % ophthalmic solution Place 1 drop into the right eye 2 (two) times daily.  Marland Kitchen latanoprost (XALATAN) 0.005 % ophthalmic solution Place 1 drop into both eyes at bedtime.  Marland Kitchen lisinopril-hydrochlorothiazide (PRINZIDE,ZESTORETIC) 20-25 MG tablet Take 1 tablet by mouth at bedtime.   . Meclizine HCl (MEDI-MECLIZINE PO) Take by mouth as needed.   . Melatonin 3 MG CAPS Take 1 capsule by mouth at bedtime as needed (sleep).   . metoprolol tartrate (LOPRESSOR) 50 MG tablet Take 1 tablet (50 mg total) by mouth 2 (two) times daily.  Marland Kitchen omeprazole (PRILOSEC) 20 MG capsule Take 40 mg by mouth every other day. Alternates between Tums and omeprazole every other day   . rivaroxaban (XARELTO) 20 MG TABS tablet TAKE 1 TABLET BY MOUTH DAILY WITH SUPPER.  . simvastatin (ZOCOR) 20 MG tablet Take 20 mg by mouth daily at 6 PM.      Allergies:   Other   Social History   Tobacco Use  . Smoking status: Former Smoker    Packs/day: 1.00    Years: 10.00    Pack years: 10.00    Types: Cigarettes    Start date: 12/17/1966    Quit date: 12/17/1976    Years since quitting: 42.4  . Smokeless tobacco: Never Used  . Tobacco comment: QUIT 35 YEARS AGO  Substance Use Topics  . Alcohol use: No    Alcohol/week: 0.0 standard drinks  . Drug use: No     Family Hx: The patient's family history includes Cancer in an other family member; Colon cancer in her maternal grandmother and mother; Heart failure in an other family member. There is no history  of Colon polyps.  ROS:   Please see the history of present illness.     All other systems reviewed and are negative.   Prior CV studies:   The following studies were reviewed today:  Lexiscan 02/13/19:   There was no ST segment deviation noted during stress.  The study is normal. No ischemia or scar.  This is a low risk study.  Nuclear stress EF: 64%.       Labs/Other Tests and Data Reviewed:    EKG:  No ECG reviewed.  Recent Labs: 02/05/2019: BUN 19; Creatinine, Ser 0.62; Hemoglobin 13.5; Magnesium 2.1; Platelets 260; Potassium 4.1; Sodium 138; TSH 1.692   Recent Lipid Panel No results found for: CHOL, TRIG, HDL, CHOLHDL, LDLCALC, LDLDIRECT  Wt Readings from Last  3 Encounters:  05/28/19 169 lb (76.7 kg)  02/05/19 170 lb 6.4 oz (77.3 kg)  09/26/18 174 lb 6.4 oz (79.1 kg)     Objective:    Vital Signs:  BP 110/68   Pulse 62   Ht 5\' 4"  (1.626 m)   Wt 169 lb (76.7 kg)   BMI 29.01 kg/m    VITAL SIGNS:  reviewed  ASSESSMENT & PLAN:    1. Paroxysmal atrial fibrillation/palpitations: In order to further elucidate the etiology of her symptoms, I will obtain a 2-week event monitor.  Labs reviewed above and found to be normal including CBC, basic metabolic panel, TSH, and magnesium.  If she requires antiarrhythmic therapy, I may consider an EP referral.  Given her normal nuclear stress test, flecainide may be a reasonable option. As heart rate is in the 60 bpm range, I will maintain the Lopressor dose at 50 mg twice daily and continue to use short acting diltiazem as needed.  She is anticoagulated with Xarelto.  2.  Chest pain: Symptoms have subsided.  Nuclear stress test was normal.  No further cardiac investigations are needed at this time.  3.  Hypertension: Blood pressure is controlled.  No changes to therapy.  4.  Hyperlipidemia: She remains on simvastatin 20 mg.   COVID-19 Education: The signs and symptoms of COVID-19 were discussed with the patient and how  to seek care for testing (follow up with PCP or arrange E-visit).  The importance of social distancing was discussed today.  Time:   Today, I have spent 25 minutes with the patient with telehealth technology discussing the above problems.     Medication Adjustments/Labs and Tests Ordered: Current medicines are reviewed at length with the patient today.  Concerns regarding medicines are outlined above.   Tests Ordered: No orders of the defined types were placed in this encounter.   Medication Changes: No orders of the defined types were placed in this encounter.   Disposition:  Follow up in 3 month(s)  Signed, Kate Sable, MD  05/28/2019 9:10 AM    [place]

## 2019-05-28 NOTE — Telephone Encounter (Signed)
Pre-cert Verification for the following procedure    2 wk event monitor

## 2019-06-04 ENCOUNTER — Telehealth: Payer: Self-pay | Admitting: Cardiovascular Disease

## 2019-06-04 NOTE — Telephone Encounter (Signed)
Informed patient order was placed on 05/28/2019.  May be back logged due to covid.  Number provided to call if she has not heard anything by Monday at the latest.  She verbalized understanding.

## 2019-06-04 NOTE — Telephone Encounter (Signed)
Patient called stating that she has not received her heart monitor.

## 2019-06-05 ENCOUNTER — Ambulatory Visit (INDEPENDENT_AMBULATORY_CARE_PROVIDER_SITE_OTHER): Payer: Medicare Other

## 2019-06-05 DIAGNOSIS — R002 Palpitations: Secondary | ICD-10-CM | POA: Diagnosis not present

## 2019-06-18 ENCOUNTER — Other Ambulatory Visit: Payer: Self-pay | Admitting: Cardiovascular Disease

## 2019-06-30 DIAGNOSIS — Z85828 Personal history of other malignant neoplasm of skin: Secondary | ICD-10-CM | POA: Diagnosis not present

## 2019-06-30 DIAGNOSIS — L570 Actinic keratosis: Secondary | ICD-10-CM | POA: Diagnosis not present

## 2019-06-30 DIAGNOSIS — L821 Other seborrheic keratosis: Secondary | ICD-10-CM | POA: Diagnosis not present

## 2019-07-14 ENCOUNTER — Encounter: Payer: Self-pay | Admitting: Internal Medicine

## 2019-07-22 DIAGNOSIS — H401131 Primary open-angle glaucoma, bilateral, mild stage: Secondary | ICD-10-CM | POA: Diagnosis not present

## 2019-08-14 ENCOUNTER — Ambulatory Visit (INDEPENDENT_AMBULATORY_CARE_PROVIDER_SITE_OTHER): Payer: Medicare Other | Admitting: Gastroenterology

## 2019-08-14 ENCOUNTER — Other Ambulatory Visit: Payer: Self-pay

## 2019-08-14 ENCOUNTER — Encounter: Payer: Self-pay | Admitting: Gastroenterology

## 2019-08-14 VITALS — BP 120/75 | HR 59 | Temp 96.6°F | Ht 64.0 in | Wt 170.0 lb

## 2019-08-14 DIAGNOSIS — D126 Benign neoplasm of colon, unspecified: Secondary | ICD-10-CM

## 2019-08-14 NOTE — Progress Notes (Addendum)
Primary Care Physician:  [name], MD  Primary Gastroenterologist:  [name], MD REVIEWED-NO ADDITIONAL RECOMMENDATIONS.  Chief Complaint  Patient presents with  . Colonoscopy    due for tcs, doing ok    HPI:  Heather Cunningham is a 68 y.o. female here to schedule surveillance colonoscopy.  She was last seen in April 2020 virtual visit.  Last colonoscopy was in November 2016.  She had 5 tubular adenomas removed, diverticulosis, hemorrhoids.  Advised for 3-year surveillance colonoscopy with propofol with overtube.  She has a family history of colon cancer, mother and maternal grandmother.  Since her last office visit, she followed up with cardiology for palpitations as advised.  She wore heart monitor for 2 weeks, she reports it was normal.  Palpitations have settled down.  Denies any chest pain.  Bowel movements are regular.  No blood in the stool or melena.  No abdominal pain.  No upper GI symptoms.  Heartburn is well controlled with omeprazole and Tums.    Current Outpatient Medications  Medication Sig Dispense Refill  . acetaminophen (TYLENOL) 500 MG tablet Take 1,000 mg by mouth every 6 (six) hours as needed for mild pain.    Marland Kitchen albuterol (PROAIR HFA) 108 (90 BASE) MCG/ACT inhaler Inhale 2 puffs into the lungs every 6 (six) hours as needed for wheezing or shortness of breath.     . calcium carbonate (TUMS - DOSED IN MG ELEMENTAL CALCIUM) 500 MG chewable tablet Chew 1 tablet by mouth every other day. Alternates between tums and omeprazole    . Calcium Carbonate-Vitamin D (CALTRATE 600+D) 600-400 MG-UNIT per tablet Take 1 tablet by mouth daily.     Marland Kitchen diltiazem (CARDIZEM) 30 MG tablet Take 1 tablet (30 mg total) by mouth daily as needed (Palpitations). (Patient taking differently: Take 30 mg by mouth 4 (four) times daily. ) 30 tablet 6  . docusate sodium (COLACE) 100 MG capsule Take 100 mg by mouth 2 (two) times daily.     . dorzolamide (TRUSOPT) 2 % ophthalmic solution Place 1 drop into  the right eye 2 (two) times daily.    Marland Kitchen latanoprost (XALATAN) 0.005 % ophthalmic solution Place 1 drop into both eyes at bedtime.    Marland Kitchen lisinopril-hydrochlorothiazide (PRINZIDE,ZESTORETIC) 20-25 MG tablet Take 1 tablet by mouth at bedtime.     . Meclizine HCl (MEDI-MECLIZINE PO) Take by mouth as needed.     . Melatonin 3 MG CAPS Take 1 capsule by mouth at bedtime as needed (sleep).     . metoprolol tartrate (LOPRESSOR) 50 MG tablet Take 1 tablet (50 mg total) by mouth 2 (two) times daily. 180 tablet 3  . omeprazole (PRILOSEC) 20 MG capsule Take 40 mg by mouth every other day. Alternates between Tums and omeprazole every other day     . simvastatin (ZOCOR) 20 MG tablet Take 20 mg by mouth daily at 6 PM.     . vitamin C (ASCORBIC ACID) 500 MG tablet Take 500 mg by mouth daily.    Alveda Reasons 20 MG TABS tablet TAKE 1 TABLET BY MOUTH EVERY DAY WITH SUPPER 30 tablet 6   No current facility-administered medications for this visit.     Allergies as of 08/14/2019 - Review Complete 08/14/2019  Allergen Reaction Noted  . Other  10/29/2015    Past Medical History:  Diagnosis Date  . Arthritis    back  . Asthma    reactive airway around cats and dogs  . Bilateral carpal tunnel syndrome   .  Cancer (HCC)    Skin  . Complication of anesthesia   . GERD (gastroesophageal reflux disease)   . Glaucoma   . Hypertension   . Paroxysmal atrial fibrillation (HCC)   . PONV (postoperative nausea and vomiting)   . Premature ventricular contraction     Past Surgical History:  Procedure Laterality Date  . BREAST EXCISIONAL BIOPSY Left 1996  . BREAST EXCISIONAL BIOPSY Right 1993  . BREAST LUMPECTOMY    . CATARACT EXTRACTION    . COLONOSCOPY N/A 09/05/2015   Procedure: COLONOSCOPY;  Surgeon: [name], MD;  [facility];  Service: Endoscopy;  Laterality: N/A;  incomplete colonoscopy  . COLONOSCOPY WITH PROPOFOL N/A 11/01/2015   [id number] hemorrhoids/five polyps/mild diverticulosis,  tubular adenomas.  Next colonoscopy 3 years with propofol with overtube.  Marland Kitchen EYE SURGERY    . HYSTEROSCOPY W/D&C N/A 12/31/2016   Procedure: DILATATION AND CURETTAGE /HYSTEROSCOPY, POSSIBLE REMOVAL OF ENDOMETRIAL LESION;  Surgeon: [name], MD;  Location: [place] [facility] [place] [facility];  Service: Gynecology;  Laterality: N/A;  . Maxiofacial    . POLYPECTOMY  11/01/2015   Procedure: POLYPECTOMY;  Surgeon: [name], MD;  Location: [facility];  Service: Endoscopy;;  . [place]  . TUBAL LIGATION      Family History  Problem Relation Age of Onset  . Cancer Other   . Heart failure Other   . Colon cancer Mother   . Colon cancer Maternal Grandmother   . Colon polyps Neg Hx     Social History   Socioeconomic History  . Marital status: Married    Spouse name: [name]  . Number of children: 1  . Years of education: Not on file  . Highest education level: Not on file  Occupational History  . Occupation: [employer]    Employer: OTHER  Social Needs  . Financial resource strain: Not on file  . Food insecurity    Worry: Not on file    Inability: Not on file  . Transportation needs    Medical: Not on file    Non-medical: Not on file  Tobacco Use  . Smoking status: Former Smoker    Packs/day: 1.00    Years: 10.00    Pack years: 10.00    Types: Cigarettes    Start date: 12/17/1966    Quit date: 12/17/1976    Years since quitting: 42.6  . Smokeless tobacco: Never Used  . Tobacco comment: QUIT 35 YEARS AGO  Substance and Sexual Activity  . Alcohol use: No    Alcohol/week: 0.0 standard drinks  . Drug use: No  . Sexual activity: Not on file  Lifestyle  . Physical activity    Days per week: Not on file    Minutes per session: Not on file  . Stress: Not on file  Relationships  . Social Herbalist on phone: Not on file    Gets together: Not on file    Attends religious service: Not on file    Active member of club or organization: Not on file     Attends meetings of clubs or organizations: Not on file    Relationship status: Not on file  . Intimate partner violence    Fear of current or ex partner: Not on file    Emotionally abused: Not on file    Physically abused: Not on file    Forced sexual activity: Not on file  Other Topics Concern  .  Not on file  Social History Narrative   Pt gets regular exercise. 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 WORKED FOR [employer]. KIDS: [place], [place] WAS IN THE [employer].      ROS:  General: Negative for anorexia, weight loss, fever, chills, fatigue, weakness. Eyes: Negative for vision changes.  ENT: Negative for hoarseness, difficulty swallowing , nasal congestion. CV: Negative for chest pain, angina, palpitations, dyspnea on exertion, peripheral edema.  Respiratory: Negative for dyspnea at rest, dyspnea on exertion, cough, sputum, wheezing.  GI: See history of present illness. GU:  Negative for dysuria, hematuria, urinary incontinence, urinary frequency, nocturnal urination.  MS: Negative for joint pain, low back pain.  Complains of left sciatica pain. Derm: Negative for rash or itching.  Neuro: Negative for weakness, abnormal sensation, seizure, frequent headaches, memory loss, confusion.  Psych: Negative for anxiety, depression, suicidal ideation, hallucinations.  Endo: Negative for unusual weight change.  Heme: Negative for bruising or bleeding. Allergy: Negative for rash or hives.    Physical Examination:  BP 120/75   Pulse (!) 59   Temp (!) 96.6 F (35.9 C) (Temporal)   Ht 5\' 4"  (1.626 m)   Wt 170 lb (77.1 kg)   BMI 29.18 kg/m    General: Well-nourished, well-developed in no acute distress.  Head: Normocephalic, atraumatic.   Eyes: Conjunctiva pink, no icterus. Mouth: masked Neck: Supple without thyromegaly, masses, or lymphadenopathy.  Lungs: Clear to auscultation bilaterally.  Heart: Regular rate and rhythm, no murmurs rubs or  gallops.  Abdomen: Bowel sounds are normal, nontender, nondistended, no hepatosplenomegaly or masses, no abdominal bruits or    hernia , no rebound or guarding.   Rectal: Not performed Extremities: No lower extremity edema. No clubbing or deformities.  Neuro: Alert and oriented x 4 , grossly normal neurologically.  Skin: Warm and dry, no rash or jaundice.   Psych: Alert and cooperative, normal mood and affect.  Labs: Lab Results  Component Value Date   TSH 1.692 02/05/2019   Lab Results  Component Value Date   CREATININE 0.62 02/05/2019   BUN 19 02/05/2019   NA 138 02/05/2019   K 4.1 02/05/2019   CL 102 02/05/2019   CO2 27 02/05/2019   Lab Results  Component Value Date   WBC 9.0 02/05/2019   HGB 13.5 02/05/2019   HCT 42.5 02/05/2019   MCV 92.6 02/05/2019   PLT 260 02/05/2019     Imaging Studies: No results found.

## 2019-08-14 NOTE — Patient Instructions (Signed)
Colonoscopy as scheduled. See separate instructions.  You will be given specific instructions about holding your Xarelto, generally we hold for 48 hours before your procedure.

## 2019-08-14 NOTE — Assessment & Plan Note (Addendum)
Clinically doing well.  Ready to schedule her colonoscopy, surveillance for colon polyps.  She would like to postpone at least till October because of September travel plans.  Plan for colonoscopy with overtube with propofol in the near future.  She failed conscious sedation previously.  We will plan to hold her Xarelto for 2 days prior to colonoscopy, specific instructions to be provided once date and time picked out.  I have discussed the risks, alternatives, benefits with regards to but not limited to the risk of reaction to medication, bleeding, infection, perforation and the patient is agreeable to proceed. Written consent to be obtained.

## 2019-08-19 DIAGNOSIS — Z961 Presence of intraocular lens: Secondary | ICD-10-CM | POA: Diagnosis not present

## 2019-08-19 DIAGNOSIS — H401131 Primary open-angle glaucoma, bilateral, mild stage: Secondary | ICD-10-CM | POA: Diagnosis not present

## 2019-08-19 DIAGNOSIS — H524 Presbyopia: Secondary | ICD-10-CM | POA: Diagnosis not present

## 2019-08-20 ENCOUNTER — Other Ambulatory Visit: Payer: Self-pay | Admitting: *Deleted

## 2019-08-20 ENCOUNTER — Telehealth: Payer: Self-pay | Admitting: *Deleted

## 2019-08-20 DIAGNOSIS — Z86010 Personal history of colonic polyps: Secondary | ICD-10-CM

## 2019-08-20 MED ORDER — NA SULFATE-K SULFATE-MG SULF 17.5-3.13-1.6 GM/177ML PO SOLN: 1.0000 | Freq: Once | ORAL | 0 refills | Status: AC

## 2019-08-20 NOTE — Telephone Encounter (Signed)
Instructions mailed with pre-op/covid-19 appt

## 2019-08-20 NOTE — Telephone Encounter (Signed)
Called patient. She is scheduled for TCS with propofol with SLF 11/16 at 2:00pm. Patient aware will mail her prep instructions with her COVID-19 appt and pre-op appt. Rx to be sent to pharmacy. Aware we will adjust her xarelto on her instructions. Fowarding to [facility]

## 2019-08-20 NOTE — Telephone Encounter (Signed)
Spoke with Dr. Oneida Alar.  She should take her last dose of Xarelto on 11/14 supper time (no later than 6pm).

## 2019-08-31 DIAGNOSIS — Z23 Encounter for immunization: Secondary | ICD-10-CM | POA: Diagnosis not present

## 2019-09-09 ENCOUNTER — Telehealth: Payer: Self-pay | Admitting: Cardiovascular Disease

## 2019-09-09 NOTE — Telephone Encounter (Signed)

## 2019-09-10 ENCOUNTER — Other Ambulatory Visit: Payer: Self-pay

## 2019-09-10 ENCOUNTER — Encounter: Payer: Self-pay | Admitting: Cardiovascular Disease

## 2019-09-10 ENCOUNTER — Ambulatory Visit (INDEPENDENT_AMBULATORY_CARE_PROVIDER_SITE_OTHER): Payer: Medicare Other | Admitting: Cardiovascular Disease

## 2019-09-10 VITALS — BP 110/68 | HR 63 | Ht 64.0 in | Wt 170.0 lb

## 2019-09-10 DIAGNOSIS — I10 Essential (primary) hypertension: Secondary | ICD-10-CM | POA: Diagnosis not present

## 2019-09-10 DIAGNOSIS — I480 Paroxysmal atrial fibrillation: Secondary | ICD-10-CM

## 2019-09-10 DIAGNOSIS — E785 Hyperlipidemia, unspecified: Secondary | ICD-10-CM | POA: Diagnosis not present

## 2019-09-10 NOTE — Patient Instructions (Signed)
Medication Instructions:  Continue all current medications.  Labwork: none  Testing/Procedures: none  Follow-Up: To be determined.   Any Other Special Instructions Will Be Listed Below (If Applicable). You have been referred to:  Dr. Rayann Heman   If you need a refill on your cardiac medications before your next appointment, please call your pharmacy.

## 2019-09-10 NOTE — Progress Notes (Signed)
SUBJECTIVE: The patient presents for routine follow-up.  Nuclear stress test on 02/13/2019 was normal.  She has a history of paroxysmal atrial fibrillation.  Event monitoring demonstrated sinus rhythm.  There were no arrhythmias.  She said she had an episode of atrial fibrillation on 08/15/2019.  It lasted for about 4 hours.  She took 1 tablet of diltiazem and symptoms eventually resolved.  She notices more palpitations after heavily exerting herself while doing yard work.  She denies chest pain, leg swelling, and shortness of breath.    Review of Systems: As per "subjective", otherwise negative.  Allergies  Allergen Reactions   Other     CANNOT RECEIVE AN MRI DUE TO METAL BAND (RIGHT EYE) DUE TO DETATCHED RETINA     Current Outpatient Medications  Medication Sig Dispense Refill   acetaminophen (TYLENOL) 500 MG tablet Take 1,000 mg by mouth every 6 (six) hours as needed for mild pain.     albuterol (PROAIR HFA) 108 (90 BASE) MCG/ACT inhaler Inhale 2 puffs into the lungs every 6 (six) hours as needed for wheezing or shortness of breath.      calcium carbonate (TUMS - DOSED IN MG ELEMENTAL CALCIUM) 500 MG chewable tablet Chew 1 tablet by mouth every other day. Alternates between tums and omeprazole     Calcium Carbonate-Vitamin D (CALTRATE 600+D) 600-400 MG-UNIT per tablet Take 1 tablet by mouth daily.      diltiazem (CARDIZEM) 30 MG tablet Take 1 tablet (30 mg total) by mouth daily as needed (Palpitations). (Patient taking differently: Take 30 mg by mouth 4 (four) times daily. ) 30 tablet 6   docusate sodium (COLACE) 100 MG capsule Take 100 mg by mouth 2 (two) times daily.      dorzolamide (TRUSOPT) 2 % ophthalmic solution Place 1 drop into the right eye 2 (two) times daily.     latanoprost (XALATAN) 0.005 % ophthalmic solution Place 1 drop into both eyes at bedtime.     lisinopril-hydrochlorothiazide (PRINZIDE,ZESTORETIC) 20-25 MG tablet Take 1 tablet by mouth at  bedtime.      Meclizine HCl (MEDI-MECLIZINE PO) Take by mouth as needed.      Melatonin 3 MG CAPS Take 1 capsule by mouth at bedtime as needed (sleep).      metoprolol tartrate (LOPRESSOR) 50 MG tablet Take 1 tablet (50 mg total) by mouth 2 (two) times daily. 180 tablet 3   omeprazole (PRILOSEC) 40 MG capsule Take 40 mg by mouth daily.     simvastatin (ZOCOR) 20 MG tablet Take 20 mg by mouth daily at 6 PM.      vitamin C (ASCORBIC ACID) 500 MG tablet Take 500 mg by mouth daily.     XARELTO 20 MG TABS tablet TAKE 1 TABLET BY MOUTH EVERY DAY WITH SUPPER 30 tablet 6   No current facility-administered medications for this visit.     Past Medical History:  Diagnosis Date   Arthritis    back   Asthma    reactive airway around cats and dogs   Bilateral carpal tunnel syndrome    Cancer (HCC)    Skin   Complication of anesthesia    GERD (gastroesophageal reflux disease)    Glaucoma    Hypertension    Paroxysmal atrial fibrillation (HCC)    PONV (postoperative nausea and vomiting)    Premature ventricular contraction     Past Surgical History:  Procedure Laterality Date   BREAST EXCISIONAL BIOPSY Left 1996   BREAST EXCISIONAL  BIOPSY Right 1993   BREAST LUMPECTOMY     CATARACT EXTRACTION     COLONOSCOPY N/A 09/05/2015   Procedure: COLONOSCOPY;  Surgeon: [name], MD;  [facility];  Service: Endoscopy;  Laterality: N/A;  incomplete colonoscopy   COLONOSCOPY WITH PROPOFOL N/A 11/01/2015   [id number] hemorrhoids/five polyps/mild diverticulosis, tubular adenomas.  Next colonoscopy 3 years with propofol with overtube.   EYE SURGERY     HYSTEROSCOPY W/D&C N/A 12/31/2016   Procedure: DILATATION AND CURETTAGE /HYSTEROSCOPY, POSSIBLE REMOVAL OF ENDOMETRIAL LESION;  Surgeon: [name], MD;  Location: [facility];  Service: Gynecology;  Laterality: N/A;   Maxiofacial     POLYPECTOMY  11/01/2015   Procedure: POLYPECTOMY;  Surgeon: [name], MD;   Location: [facility];  Service: Endoscopy;;   RETINAL DETACHMENT SURGERY  1997  1999   TUBAL LIGATION      Social History   Socioeconomic History   Marital status: Married    Spouse name: [name]   Number of children: 1   Years of education: Not on file   Highest education level: Not on file  Occupational History   Occupation: [employer]    Employer: OTHER  Social Designer, fashion/clothing strain: Not on file   Food insecurity    Worry: Not on file    Inability: Not on file   Transportation needs    Medical: Not on file    Non-medical: Not on file  Tobacco Use   Smoking status: Former Smoker    Packs/day: 1.00    Years: 10.00    Pack years: 10.00    Types: Cigarettes    Start date: 12/17/1966    Quit date: 12/17/1976    Years since quitting: 42.7   Smokeless tobacco: Never Used   Tobacco comment: QUIT 65 YEARS AGO  Substance and Sexual Activity   Alcohol use: No    Alcohol/week: 0.0 standard drinks   Drug use: No   Sexual activity: Not on file  Lifestyle   Physical activity    Days per week: Not on file    Minutes per session: Not on file   Stress: Not on file  Relationships   Social connections    Talks on phone: Not on file    Gets together: Not on file    Attends religious service: Not on file    Active member of club or organization: Not on file    Attends meetings of clubs or organizations: Not on file    Relationship status: Not on file   Intimate partner violence    Fear of current or ex partner: Not on file    Emotionally abused: Not on file    Physically abused: Not on file    Forced sexual activity: Not on file  Other Topics Concern   Not on file  Social History Narrative   Pt gets regular exercise. 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 WORKED FOR [employer]. KIDS: 1 [place], [place] WAS IN [employer].     Vitals:   09/10/19 1245  BP: 110/68  Pulse: 63  SpO2: 98%  Weight: 170  lb (77.1 kg)  Height: 5\' 4"  (1.626 m)    Wt Readings from Last 3 Encounters:  09/10/19 170 lb (77.1 kg)  08/14/19 170 lb (77.1 kg)  05/28/19 169 lb (76.7 kg)     PHYSICAL EXAM General: NAD HEENT: Normal. Neck: No JVD, no thyromegaly. Lungs: Clear to auscultation  bilaterally with normal respiratory effort. CV: Regular rate and rhythm, normal S1/S2, no S3/S4, no murmur. No pretibial or periankle edema.  No carotid bruit.   Abdomen: Soft, nontender, no distention.  Neurologic: Alert and oriented.  Psych: Normal affect. Skin: Normal. Musculoskeletal: No gross deformities.      Labs: Lab Results  Component Value Date/Time   K 4.1 02/05/2019 02:19 PM   BUN 19 02/05/2019 02:19 PM   CREATININE 0.62 02/05/2019 02:19 PM   ALT 19 12/20/2016 09:00 AM   TSH 1.692 02/05/2019 02:20 PM   HGB 13.5 02/05/2019 02:19 PM     Lipids: No results found for: LDLCALC, LDLDIRECT, CHOL, TRIG, HDL     ASSESSMENT AND PLAN: 1. Paroxysmal atrial fibrillation/palpitations: Event monitoring demonstrated sinus rhythm.  There were no arrhythmias.   She may benefit from antiarrhythmic therapy. Given her normal nuclear stress test, flecainide may be a reasonable option. I also discussed the possibility of ablation.  She is willing to consider this.  She last saw Dr. Rayann Heman in December 2016.  I will make a referral back to him. As heart rate is in the 60 bpm range, I will maintain the Lopressor dose at 50 mg twice daily and continue to use short acting diltiazem as needed.  She is anticoagulated with Xarelto.  2.  Chest pain: No symptom recurrence.  Nuclear stress test was normal.  No further cardiac investigations are needed at this time.  3.  Hypertension: Blood pressure is controlled.  No changes to therapy.  4.  Hyperlipidemia: She remains on simvastatin 20 mg.     Disposition: Follow up with Dr. Rayann Heman.  Follow-up with me to be determined.   Kate Sable, M.D., F.A.C.C.

## 2019-09-13 ENCOUNTER — Other Ambulatory Visit: Payer: Self-pay | Admitting: Cardiovascular Disease

## 2019-09-16 ENCOUNTER — Telehealth (INDEPENDENT_AMBULATORY_CARE_PROVIDER_SITE_OTHER): Payer: Medicare Other | Admitting: Internal Medicine

## 2019-09-16 ENCOUNTER — Encounter: Payer: Self-pay | Admitting: Internal Medicine

## 2019-09-16 ENCOUNTER — Other Ambulatory Visit: Payer: Self-pay

## 2019-09-16 VITALS — BP 122/76 | HR 80 | Ht 64.0 in | Wt 169.0 lb

## 2019-09-16 DIAGNOSIS — I10 Essential (primary) hypertension: Secondary | ICD-10-CM

## 2019-09-16 DIAGNOSIS — R002 Palpitations: Secondary | ICD-10-CM

## 2019-09-16 DIAGNOSIS — I480 Paroxysmal atrial fibrillation: Secondary | ICD-10-CM

## 2019-09-16 DIAGNOSIS — I493 Ventricular premature depolarization: Secondary | ICD-10-CM

## 2019-09-16 NOTE — Progress Notes (Signed)
Electrophysiology TeleHealth Note   Due to national recommendations of social distancing due to [place] 19, Audio telehealth visit is felt to be most appropriate for this patient at this time.  Verbal consent was obtained from the patient today.   Date:  09/16/2019   ID:  Heather Cunningham, DOB May 17, 1951, MRN [id number]  Location: home  Provider location: [facility] Evaluation Performed: New patient consult  PCP:  [name], MD  Cardiologist:  Kate Sable, MD   Chief Complaint:  Atrial fibrillation  History of Present Illness:    Heather Cunningham is a 68 y.o. female who presents via audio/video conferencing for a telehealth visit today.   The patient is referred for new consultation regarding atrial fibrillation by Dr Bronson Ing.  I saw her previously in 2016 (my note reviewed).  At that time, her afib was controlled but she had frequent palpitations due to PVCs and PACs.  Conservative measures were advised.  She has recently worn  An event monitor in June (reviewed) which showed no arrhythmias. She feels that she does have episodes of afib.  She reports having about 4 hours of afib 08/15/2019.  She is on xarelto for stroke prevention.  She reports "I think I have more of those extra beats than I do of afib".  She notices irregular heart beats more when she has been very active during the day. She feels that stress will trigger episodes.   Today, she denies symptoms of chest pain, shortness of breath, orthopnea, PND, lower extremity edema, claudication, dizziness, presyncope, syncope, bleeding, or neurologic sequela. The patient is tolerating medications without difficulties and is otherwise without complaint today.   she denies symptoms of cough, fevers, chills, or new SOB worrisome for COVID 19.   Past Medical History:  Diagnosis Date  . Arthritis    back  . Asthma    reactive airway around cats and dogs  . Bilateral carpal tunnel syndrome   . Cancer (HCC)    Skin  . GERD  (gastroesophageal reflux disease)   . Glaucoma   . Hypertension   . Paroxysmal atrial fibrillation (HCC)   . PONV (postoperative nausea and vomiting)   . Premature ventricular contraction     Past Surgical History:  Procedure Laterality Date  . BREAST EXCISIONAL BIOPSY Left 1996  . BREAST EXCISIONAL BIOPSY Right 1993  . BREAST LUMPECTOMY    . CATARACT EXTRACTION    . COLONOSCOPY N/A 09/05/2015   Procedure: COLONOSCOPY;  Surgeon: [name], MD;  [facility];  Service: Endoscopy;  Laterality: N/A;  incomplete colonoscopy  . COLONOSCOPY WITH PROPOFOL N/A 11/01/2015   [id number] hemorrhoids/five polyps/mild diverticulosis, tubular adenomas.  Next colonoscopy 3 years with propofol with overtube.  Marland Kitchen EYE SURGERY    . HYSTEROSCOPY W/D&C N/A 12/31/2016   Procedure: DILATATION AND CURETTAGE /HYSTEROSCOPY, POSSIBLE REMOVAL OF ENDOMETRIAL LESION;  Surgeon: [name], MD;  Location: [facility];  Service: Gynecology;  Laterality: N/A;  . Maxiofacial    . POLYPECTOMY  11/01/2015   Procedure: POLYPECTOMY;  Surgeon: [name], MD;  Location: [facility];  Service: Endoscopy;;  . [place]  . TUBAL LIGATION      Current Outpatient Medications  Medication Sig Dispense Refill  . acetaminophen (TYLENOL) 500 MG tablet Take 1,000 mg by mouth every 6 (six) hours as needed for mild pain.    Marland Kitchen albuterol (PROAIR HFA) 108 (90 BASE) MCG/ACT inhaler Inhale 2 puffs into the  lungs every 6 (six) hours as needed for wheezing or shortness of breath.     . calcium carbonate (TUMS - DOSED IN MG ELEMENTAL CALCIUM) 500 MG chewable tablet Chew 1 tablet by mouth every other day. Alternates between tums and omeprazole    . Calcium Carbonate-Vitamin D (CALTRATE 600+D) 600-400 MG-UNIT per tablet Take 1 tablet by mouth daily.     Marland Kitchen diltiazem (CARDIZEM) 30 MG tablet Take 1 tablet (30 mg total) by mouth daily as needed (Palpitations). (Patient taking differently: Take 30 mg by mouth  4 (four) times daily. ) 30 tablet 6  . docusate sodium (COLACE) 100 MG capsule Take 100 mg by mouth 2 (two) times daily.     . dorzolamide (TRUSOPT) 2 % ophthalmic solution Place 1 drop into the right eye 2 (two) times daily.    Marland Kitchen latanoprost (XALATAN) 0.005 % ophthalmic solution Place 1 drop into both eyes at bedtime.    Marland Kitchen lisinopril-hydrochlorothiazide (PRINZIDE,ZESTORETIC) 20-25 MG tablet Take 1 tablet by mouth at bedtime.     . Meclizine HCl (MEDI-MECLIZINE PO) Take by mouth as needed.     . Melatonin 3 MG CAPS Take 1 capsule by mouth at bedtime as needed (sleep).     . metoprolol tartrate (LOPRESSOR) 50 MG tablet TAKE 1 TABLET BY MOUTH TWICE DAILY 180 tablet 3  . omeprazole (PRILOSEC) 40 MG capsule Take 40 mg by mouth daily.    . simvastatin (ZOCOR) 20 MG tablet Take 20 mg by mouth daily at 6 PM.     . vitamin C (ASCORBIC ACID) 500 MG tablet Take 500 mg by mouth daily.    Alveda Reasons 20 MG TABS tablet TAKE 1 TABLET BY MOUTH EVERY DAY WITH SUPPER 30 tablet 6   No current facility-administered medications for this visit.     Allergies:   Other   Social History:  The patient  reports that she quit smoking about 42 years ago. Her smoking use included cigarettes. She started smoking about 52 years ago. She has a 10.00 pack-year smoking history. She has never used smokeless tobacco. She reports that she does not drink alcohol or use drugs.   Family History:  The patient's family history includes Cancer in an other family member; Colon cancer in her maternal grandmother and mother; Heart failure in an other family member.    ROS:  Please see the history of present illness.   All other systems are personally reviewed and negative.    Exam:    Vital Signs:  BP 122/76   Pulse 80   Ht 5\' 4"  (1.626 m)   Wt 169 lb (76.7 kg)   BMI 29.01 kg/m   Well sounding, alert and conversant    Labs/Other Tests and Data Reviewed:    Recent Labs: 02/05/2019: BUN 19; Creatinine, Ser 0.62; Hemoglobin  13.5; Magnesium 2.1; Platelets 260; Potassium 4.1; Sodium 138; TSH 1.692   Wt Readings from Last 3 Encounters:  09/16/19 169 lb (76.7 kg)  09/10/19 170 lb (77.1 kg)  08/14/19 170 lb (77.1 kg)     Other studies personally reviewed: Additional studies/ records that were reviewed today include: my prior notes,  Dr Raylene Everts notes,  Ekgs, echo 2016.  Review of the above records today demonstrates: as above   ASSESSMENT & PLAN:    1.  Paroxysmal atrial fibrillation/ PACs/PVCs/ palpitations The patient has symptomatic palpitations.  She wore a recent event monitor which did not reveal an arrhythmias. She is not certain whether her palpitations are  primarily due to ectopy or afib.  I would therefore advise implantation of an implantable loop recorder at this time.  I feel that this would be essential to determine next steps which could include pill in pocket flecainide, daily flecainide, or ablation depending on characterization and burden. Risks and benefits to ILR were discussed at length with the patient who wishes to proceed.  We will schedule in office implant at the next available time.  Given PVCs, I would favor LINQ2 device which has a PVC counter on it.  She wishes to avoid medicine changes today, until we better understand her palpitations with a loop recorder.  chads2vasc score is 3.  She is on anticoagulation  2. HTN Stable No change required today  Follow-up:  In office LINQ at the next available time  Current medicines are reviewed at length with the patient today.   The patient does not have concerns regarding her medicines.  The following changes were made today:  none  Labs/ tests ordered today include:  No orders of the defined types were placed in this encounter.   Patient Risk:  after full review of this patients clinical status, I feel that they are at moderate risk at this time.   Today, I have spent 20 minutes with the patient with telehealth technology  discussing afib .    SignedThompson Grayer MD, [place] 09/16/2019 9:24 AM   [address] [place] [address] [phone] (office) [phone] (fax)

## 2019-09-23 ENCOUNTER — Telehealth: Payer: Self-pay | Admitting: Internal Medicine

## 2019-09-23 NOTE — Telephone Encounter (Signed)
New Message:      Pt is calling to find out if her insurance was approved so she can have her procedure. If she is not at home, please just leave her a message.

## 2019-09-28 ENCOUNTER — Other Ambulatory Visit: Payer: Self-pay

## 2019-09-28 ENCOUNTER — Encounter: Payer: Self-pay | Admitting: Internal Medicine

## 2019-09-28 ENCOUNTER — Ambulatory Visit (INDEPENDENT_AMBULATORY_CARE_PROVIDER_SITE_OTHER): Payer: Medicare Other | Admitting: Internal Medicine

## 2019-09-28 VITALS — BP 120/78 | HR 64 | Ht 64.0 in | Wt 170.0 lb

## 2019-09-28 DIAGNOSIS — I10 Essential (primary) hypertension: Secondary | ICD-10-CM

## 2019-09-28 DIAGNOSIS — R002 Palpitations: Secondary | ICD-10-CM

## 2019-09-28 DIAGNOSIS — I493 Ventricular premature depolarization: Secondary | ICD-10-CM | POA: Diagnosis not present

## 2019-09-28 DIAGNOSIS — I480 Paroxysmal atrial fibrillation: Secondary | ICD-10-CM | POA: Diagnosis not present

## 2019-09-28 DIAGNOSIS — Z6828 Body mass index (BMI) 28.0-28.9, adult: Secondary | ICD-10-CM | POA: Diagnosis not present

## 2019-09-28 DIAGNOSIS — Z0001 Encounter for general adult medical examination with abnormal findings: Secondary | ICD-10-CM | POA: Diagnosis not present

## 2019-09-28 DIAGNOSIS — M810 Age-related osteoporosis without current pathological fracture: Secondary | ICD-10-CM | POA: Diagnosis not present

## 2019-09-28 DIAGNOSIS — E782 Mixed hyperlipidemia: Secondary | ICD-10-CM | POA: Diagnosis not present

## 2019-09-28 HISTORY — PX: OTHER SURGICAL HISTORY: SHX169

## 2019-09-28 NOTE — Progress Notes (Signed)
PCP: [name], MD Primary Cardiologist: Dr Bronson Ing Primary EP: Dr Rayann Heman  Heather Cunningham is a 68 y.o. female who presents today for routine electrophysiology followup.  Since last being seen in our clinic, the patient reports doing very well.  She has ongoing issues with palpitations. Today, she denies symptoms of chest pain, shortness of breath,  lower extremity edema, dizziness, presyncope, or syncope.  The patient is otherwise without complaint today.   Past Medical History:  Diagnosis Date  . Arthritis    back  . Asthma    reactive airway around cats and dogs  . Bilateral carpal tunnel syndrome   . Cancer (HCC)    Skin  . GERD (gastroesophageal reflux disease)   . Glaucoma   . Hypertension   . Paroxysmal atrial fibrillation (HCC)   . PONV (postoperative nausea and vomiting)   . Premature ventricular contraction    Past Surgical History:  Procedure Laterality Date  . BREAST EXCISIONAL BIOPSY Left 1996  . BREAST EXCISIONAL BIOPSY Right 1993  . BREAST LUMPECTOMY    . CATARACT EXTRACTION    . COLONOSCOPY N/A 09/05/2015   Procedure: COLONOSCOPY;  Surgeon: [name], MD;  [facility];  Service: Endoscopy;  Laterality: N/A;  incomplete colonoscopy  . COLONOSCOPY WITH PROPOFOL N/A 11/01/2015   [id number] hemorrhoids/five polyps/mild diverticulosis, tubular adenomas.  Next colonoscopy 3 years with propofol with overtube.  Marland Kitchen EYE SURGERY    . HYSTEROSCOPY W/D&C N/A 12/31/2016   Procedure: DILATATION AND CURETTAGE /HYSTEROSCOPY, POSSIBLE REMOVAL OF ENDOMETRIAL LESION;  Surgeon: [name], MD;  Location: [facility];  Service: Gynecology;  Laterality: N/A;  . Maxiofacial    . POLYPECTOMY  11/01/2015   Procedure: POLYPECTOMY;  Surgeon: [name], MD;  Location: [facility];  Service: Endoscopy;;  . [place]  . TUBAL LIGATION      ROS- all systems are reviewed and negatives except as per HPI above  Current Outpatient Medications   Medication Sig Dispense Refill  . acetaminophen (TYLENOL) 500 MG tablet Take 1,000 mg by mouth every 6 (six) hours as needed for mild pain.    Marland Kitchen albuterol (PROAIR HFA) 108 (90 BASE) MCG/ACT inhaler Inhale 2 puffs into the lungs every 6 (six) hours as needed for wheezing or shortness of breath.     . calcium carbonate (TUMS - DOSED IN MG ELEMENTAL CALCIUM) 500 MG chewable tablet Chew 1 tablet by mouth every other day. Alternates between tums and omeprazole    . Calcium Carbonate-Vitamin D (CALTRATE 600+D) 600-400 MG-UNIT per tablet Take 1 tablet by mouth daily.     Marland Kitchen diltiazem (CARDIZEM) 30 MG tablet Take 1 tablet (30 mg total) by mouth daily as needed (Palpitations). (Patient taking differently: Take 30 mg by mouth 4 (four) times daily. ) 30 tablet 6  . docusate sodium (COLACE) 100 MG capsule Take 100 mg by mouth 2 (two) times daily.     . dorzolamide (TRUSOPT) 2 % ophthalmic solution Place 1 drop into the right eye 2 (two) times daily.    Marland Kitchen latanoprost (XALATAN) 0.005 % ophthalmic solution Place 1 drop into both eyes at bedtime.    Marland Kitchen lisinopril-hydrochlorothiazide (PRINZIDE,ZESTORETIC) 20-25 MG tablet Take 1 tablet by mouth at bedtime.     . Meclizine HCl (MEDI-MECLIZINE PO) Take by mouth as needed.     . Melatonin 3 MG CAPS Take 1 capsule by mouth at bedtime as needed (sleep).     . metoprolol tartrate (  LOPRESSOR) 50 MG tablet TAKE 1 TABLET BY MOUTH TWICE DAILY 180 tablet 3  . omeprazole (PRILOSEC) 40 MG capsule Take 40 mg by mouth daily.    . simvastatin (ZOCOR) 20 MG tablet Take 20 mg by mouth daily at 6 PM.     . vitamin C (ASCORBIC ACID) 500 MG tablet Take 500 mg by mouth daily.    Alveda Reasons 20 MG TABS tablet TAKE 1 TABLET BY MOUTH EVERY DAY WITH SUPPER 30 tablet 6   No current facility-administered medications for this visit.     Physical Exam: Vitals:   09/28/19 1554  BP: 120/78  Pulse: 64  SpO2: 97%  Weight: 170 lb (77.1 kg)  Height: 5\' 4"  (1.626 m)    GEN- The patient is  well appearing, alert and oriented x 3 today.   Head- normocephalic, atraumatic Eyes-  Sclera clear, conjunctiva pink Ears- hearing intact Oropharynx- clear Lungs-   normal work of breathing Limited exam due to COVID 19 restrictions    Wt Readings from Last 3 Encounters:  09/28/19 170 lb (77.1 kg)  09/16/19 169 lb (76.7 kg)  09/10/19 170 lb (77.1 kg)     Assessment and Plan:  1. Paroxysmal atrial fibrillation/ PACs/ PVCs/ palpitation Prior event monitor did not reveal arrhythmias I would advise long term monitoring to further characterize her arrhythmia burden and then decide on treatment options. Risks and benefits to ILR were discussed with the patient who wishes to proceed. She is on eliquis for chads2vasc score of 3.  2. HTN Stable No change required today    Thompson Grayer MD, [facility] 09/28/2019 4:24 PM   DESCRIPTION OF PROCEDURE:  Informed written consent was obtained.  The patient required no sedation for the procedure today.  Mapping over the patient's chest was performed to identify the area where electrograms were most prominent for ILR recording.  This area was found to be the left parasternal region over the 3rd-4th intercostal space. The patients left chest was therefore prepped and draped in the usual sterile fashion. The skin overlying the left parasternal region was infiltrated with lidocaine for local analgesia.  A 0.5-cm incision was made over the left parasternal region over the 3rd intercostal space.  A subcutaneous ILR pocket was fashioned using a combination of sharp and blunt dissection.  A Medtronic Reveal Linq model [id number] (SN [id number]) implantable loop recorder was then placed into the pocket  R waves were very prominent and measured >0.33mV.  Steri- Strips and a sterile dressing were then applied.  There were no early apparent complications.     CONCLUSIONS:   1. Successful implantation of a Medtronic Reveal LINQ implantable loop recorder for palpitations  and recurrent symptoms of atrial fibrillation  2. No early apparent complications.   Thompson Grayer MD, [facility] 09/28/2019 4:26 PM

## 2019-09-28 NOTE — Patient Instructions (Addendum)
Medication Instructions:  Your physician recommends that you continue on your current medications as directed. Please refer to the Current Medication list given to you today.  Labwork: None ordered.  Testing/Procedures: None ordered.  Follow-Up: Your physician wants you to follow-up in: 3 months with Dr. Rayann Heman.      Monthly remote monitoring  Any Other Special Instructions Will Be Listed Below (If Applicable).  If you need a refill on your cardiac medications before your next appointment, please call your pharmacy.    Implantable Loop Recorder Placement, Care After This sheet gives you information about how to care for yourself after your procedure. Your health care provider may also give you more specific instructions. If you have problems or questions, contact your health care provider. What can I expect after the procedure? After the procedure, it is common to have:  Soreness or discomfort near the incision.  Some swelling or bruising near the incision. Follow these instructions at home: Incision care   Follow instructions from your health care provider about how to take care of your incision. Make sure you: ? Wash your hands with soap and water before you change your bandage (dressing). If soap and water are not available, use hand sanitizer. ? Change your dressing as told by your health care provider. ? Keep your dressing dry. ? Leave stitches (sutures), skin glue, or adhesive strips in place. These skin closures may need to stay in place for 2 weeks or longer. If adhesive strip edges start to loosen and curl up, you may trim the loose edges. Do not remove adhesive strips completely unless your health care provider tells you to do that.  Check your incision area every day for signs of infection. Check for: ? Redness, swelling, or pain. ? Fluid or blood. ? Warmth. ? Pus or a bad smell.  Do not take baths, swim, or use a hot tub until your health care provider  approves. Ask your health care provider if you can take showers. Activity   Return to your normal activities as told by your health care provider. Ask your health care provider what activities are safe for you.  Do not drive for 24 hours if you were given a sedative during your procedure. General instructions  Follow instructions from your health care provider about how to manage your implantable loop recorder and transmit the information. Learn how to activate a recording if this is necessary for your type of device.  Do not go through a metal detection gate, and do not let someone hold a metal detector over your chest. Show your ID card.  Do not have an MRI unless you check with your health care provider first.  Take over-the-counter and prescription medicines only as told by your health care provider.  Keep all follow-up visits as told by your health care provider. This is important. Contact a health care provider if:  You have redness, swelling, or pain around your incision.  You have a fever.  You have pain that is not relieved by your pain medicine.  You have triggered your device because of fainting (syncope) or because of a heartbeat that feels like it is racing, slow, fluttering, or skipping (palpitations). Get help right away if you have:  Chest pain.  Difficulty breathing. Summary  After the procedure, it is common to have soreness or discomfort near the incision.  Change your dressing as told by your health care provider.  Follow instructions from your health care provider about how to  manage your implantable loop recorder and transmit the information.  Keep all follow-up visits as told by your health care provider. This is important. This information is not intended to replace advice given to you by your health care provider. Make sure you discuss any questions you have with your health care provider. Document Released: 11/14/2015 Document Revised: 01/18/2018  Document Reviewed: 01/18/2018 Elsevier Patient Education  2020 [employer].

## 2019-10-16 DIAGNOSIS — I10 Essential (primary) hypertension: Secondary | ICD-10-CM | POA: Diagnosis not present

## 2019-10-16 DIAGNOSIS — E782 Mixed hyperlipidemia: Secondary | ICD-10-CM | POA: Diagnosis not present

## 2019-10-19 ENCOUNTER — Telehealth: Payer: Self-pay

## 2019-10-19 NOTE — Telephone Encounter (Signed)
Dr. Oneida Alar left a letter on my desk from this pt stating that she now has a cardiac monitor in her left chest.   She states that if any procedures the doctor should speak to the cardiologist before the procedure.  Pt has a colonoscopy with Dr. Oneida Alar scheduled for 11/02/2019.  The pamphlet is enclosed with possible warnings and precautions and Dr. Jackalyn Lombard phone number is enclosed.  His number: [phone].   Dr. Oneida Alar wrote on the note to have one of the APPS contact Dr. Rayann Heman due to her absence this week and through Wed next week.  Pt needs to be advised if it is OK to proceed with the colonoscopy.  I am placing the paperwork on the desk for Roseanne Kaufman, NP to review and speak with Dr. Rayann Heman, since she is doing the hospital call today.

## 2019-10-20 ENCOUNTER — Telehealth: Payer: Self-pay | Admitting: Internal Medicine

## 2019-10-20 NOTE — Telephone Encounter (Signed)
   Primary Cardiologist: Kate Sable, MD  Chart reviewed as part of pre-operative protocol coverage. Given past medical history and time since last visit, based on ACC/AHA guidelines, Heather Cunningham would be at acceptable risk for the planned procedure without further cardiovascular testing.   Spoke with patient 10/20/2019 with reports of improved palpitations.  Patient with diagnosis of atrial fibrillation on Xarelto for anticoagulation. ILR placed on 09/28/19.  CHADS2-VASc score of 3 (HTN, AGE, female)  CrCl 90-100 mL/min  Per office protocol, patient can hold Xarelto for 2 days prior to procedure as requested.    I will route this recommendation to the requesting party via Epic fax function and remove from pre-op pool.  Please call with questions.  Kathyrn Drown, NP 10/20/2019, 2:22 PM

## 2019-10-20 NOTE — Telephone Encounter (Signed)
Patient with diagnosis of atrial fibrillation on Xarelto for anticoagulation. ILR placed on 09/28/19.  Procedure: Colonoscopy Date of procedure: 11/02/19  CHADS2-VASc score of 3 (HTN, AGE, female)  CrCl 90-100 mL/min  Per office protocol, patient can hold Xarelto for 2 days prior to procedure as requested.

## 2019-10-20 NOTE — Telephone Encounter (Signed)
PT called to make sure that Dr. Oneida Alar received this information and had spoken to Dr. Rayann Heman. She needs to make sure it is [facility] for her to have the colonoscopy. I told her we have received the information and I will be in touch with her when this is taken care of. She said that is fine, she just wanted to make sure we received it.

## 2019-10-20 NOTE — Telephone Encounter (Signed)
° °  [facility] Pre-operative Risk Assessment    Request for surgical clearance:  1. What type of surgery is being performed? Colonoscopy   2. When is this surgery scheduled? 11/02/19  3. What type of clearance is required (medical clearance vs. Pharmacy clearance to hold med vs. Both)? both  4. Are there any medications that need to be held prior to surgery and how long?Xarelto, 2 days prior   5. Practice name and name of physician performing surgery? Dr. Oneida Alar, [facility]  6. What is your office phone number: [phone]   7.   What is your office fax number: [phone]   8.   Anesthesia type (None, local, MAC, general) ? General  Patient was concerned that with her implanted device she would be at risk to have a colonoscopy. [facility] also uses Epic, so communication can be sent via epic as well    Johnna Acosta 10/20/2019, 1:18 PM  _________________________________________________________________   (provider comments below)

## 2019-10-20 NOTE — Telephone Encounter (Signed)
I have contacted the office. They are reviewing information.

## 2019-10-21 NOTE — Telephone Encounter (Signed)
Please see telephone note. Cardiology aware. No special recommendations. Hold Xarelto as planned.

## 2019-10-22 NOTE — Telephone Encounter (Signed)
Pt is aware.  

## 2019-10-27 NOTE — Patient Instructions (Signed)
Heather Cunningham  10/27/2019     @PREFPERIOPPHARMACY @   Your procedure is scheduled on  11/02/2019 .  Report to [facility] at  1310 (1:10)  P.M.  Call this number if you have problems the morning of surgery:  [phone]   Remember:  Follow the diet and prep instructions given to you by Dr Oneida Alar office.                       Take these medicines the morning of surgery with A SIP OF WATER  Diltiazem, metoprolol, antivert(if needed).    Do not wear jewelry, make-up or nail polish.  Do not wear lotions, powders, or perfumes. Please wear deodorant and brush your teeth.  Do not shave 48 hours prior to surgery.  Men may shave face and neck.  Do not bring valuables to the hospital.  [facility] is not responsible for any belongings or valuables.  Contacts, dentures or bridgework may not be worn into surgery.  Leave your suitcase in the car.  After surgery it may be brought to your room.  For patients admitted to the hospital, discharge time will be determined by your treatment team.  Patients discharged the day of surgery will not be allowed to drive home.   Name and phone number of your driver:   family Special instructions:  None  Please read over the following fact sheets that you were given. Anesthesia Post-op Instructions and Care and Recovery After Surgery       Colonoscopy, Adult, Care After This sheet gives you information about how to care for yourself after your procedure. Your health care provider may also give you more specific instructions. If you have problems or questions, contact your health care provider. What can I expect after the procedure? After the procedure, it is common to have:  A small amount of blood in your stool for 24 hours after the procedure.  Some gas.  Mild abdominal cramping or bloating. Follow these instructions at home: General instructions  For the first 24 hours after the procedure: ? Do not drive or use machinery. ? Do not  sign important documents. ? Do not drink alcohol. ? Do your regular daily activities at a slower pace than normal. ? Eat soft, easy-to-digest foods.  Take over-the-counter or prescription medicines only as told by your health care provider. Relieving cramping and bloating   Try walking around when you have cramps or feel bloated.  Apply heat to your abdomen as told by your health care provider. Use a heat source that your health care provider recommends, such as a moist heat pack or a heating pad. ? Place a towel between your skin and the heat source. ? Leave the heat on for 20-30 minutes. ? Remove the heat if your skin turns bright red. This is especially important if you are unable to feel pain, heat, or cold. You may have a greater risk of getting burned. Eating and drinking   Drink enough fluid to keep your urine pale yellow.  Resume your normal diet as instructed by your health care provider. Avoid heavy or fried foods that are hard to digest.  Avoid drinking alcohol for as long as instructed by your health care provider. Contact a health care provider if:  You have blood in your stool 2-3 days after the procedure. Get help right away if:  You have more than a small spotting of blood in your  stool.  You pass large blood clots in your stool.  Your abdomen is swollen.  You have nausea or vomiting.  You have a fever.  You have increasing abdominal pain that is not relieved with medicine. Summary  After the procedure, it is common to have a small amount of blood in your stool. You may also have mild abdominal cramping and bloating.  For the first 24 hours after the procedure, do not drive or use machinery, sign important documents, or drink alcohol.  Contact your health care provider if you have a lot of blood in your stool, nausea or vomiting, a fever, or increased abdominal pain. This information is not intended to replace advice given to you by your health care  provider. Make sure you discuss any questions you have with your health care provider. Document Released: 07/17/2004 Document Revised: 09/25/2017 Document Reviewed: 02/14/2016 Elsevier Patient Education  2020 [place] After These instructions provide you with information about caring for yourself after your procedure. Your health care provider may also give you more specific instructions. Your treatment has been planned according to current medical practices, but problems sometimes occur. Call your health care provider if you have any problems or questions after your procedure. What can I expect after the procedure? After your procedure, you may:  Feel sleepy for several hours.  Feel clumsy and have poor balance for several hours.  Feel forgetful about what happened after the procedure.  Have poor judgment for several hours.  Feel nauseous or vomit.  Have a sore throat if you had a breathing tube during the procedure. Follow these instructions at home: For at least 24 hours after the procedure:      Have a responsible adult stay with you. It is important to have someone help care for you until you are awake and alert.  Rest as needed.  Do not: ? Participate in activities in which you could fall or become injured. ? Drive. ? Use heavy machinery. ? Drink alcohol. ? Take sleeping pills or medicines that cause drowsiness. ? Make important decisions or sign legal documents. ? Take care of children on your own. Eating and drinking  Follow the diet that is recommended by your health care provider.  If you vomit, drink water, juice, or soup when you can drink without vomiting.  Make sure you have little or no nausea before eating solid foods. General instructions  Take over-the-counter and prescription medicines only as told by your health care provider.  If you have sleep apnea, surgery and certain medicines can increase your risk for  breathing problems. Follow instructions from your health care provider about wearing your sleep device: ? Anytime you are sleeping, including during daytime naps. ? While taking prescription pain medicines, sleeping medicines, or medicines that make you drowsy.  If you smoke, do not smoke without supervision.  Keep all follow-up visits as told by your health care provider. This is important. Contact a health care provider if:  You keep feeling nauseous or you keep vomiting.  You feel light-headed.  You develop a rash.  You have a fever. Get help right away if:  You have trouble breathing. Summary  For several hours after your procedure, you may feel sleepy and have poor judgment.  Have a responsible adult stay with you for at least 24 hours or until you are awake and alert. This information is not intended to replace advice given to you by your health care provider. Make  sure you discuss any questions you have with your health care provider. Document Released: 03/25/2016 Document Revised: 03/03/2018 Document Reviewed: 03/25/2016 Elsevier Patient Education  2020 [employer].

## 2019-10-29 ENCOUNTER — Other Ambulatory Visit (HOSPITAL_COMMUNITY)
Admission: RE | Admit: 2019-10-29 | Discharge: 2019-10-29 | Disposition: A | Discharge status: Home / Self Care | Payer: Medicare Other | Source: Ambulatory Visit | Attending: Gastroenterology | Admitting: Gastroenterology

## 2019-10-29 ENCOUNTER — Encounter (HOSPITAL_COMMUNITY)
Admission: RE | Admit: 2019-10-29 | Discharge: 2019-10-29 | Disposition: A | Discharge status: Home / Self Care | Payer: Medicare Other | Source: Ambulatory Visit | Attending: Gastroenterology | Admitting: Gastroenterology

## 2019-10-29 ENCOUNTER — Other Ambulatory Visit: Payer: Self-pay

## 2019-10-29 ENCOUNTER — Encounter (HOSPITAL_COMMUNITY): Payer: Self-pay

## 2019-10-29 DIAGNOSIS — K635 Polyp of colon: Secondary | ICD-10-CM | POA: Insufficient documentation

## 2019-10-29 DIAGNOSIS — Z01812 Encounter for preprocedural laboratory examination: Secondary | ICD-10-CM | POA: Diagnosis not present

## 2019-10-29 DIAGNOSIS — Z87891 Personal history of nicotine dependence: Secondary | ICD-10-CM | POA: Insufficient documentation

## 2019-10-29 DIAGNOSIS — I10 Essential (primary) hypertension: Secondary | ICD-10-CM | POA: Insufficient documentation

## 2019-10-29 DIAGNOSIS — Z79899 Other long term (current) drug therapy: Secondary | ICD-10-CM | POA: Insufficient documentation

## 2019-10-29 DIAGNOSIS — J45909 Unspecified asthma, uncomplicated: Secondary | ICD-10-CM | POA: Insufficient documentation

## 2019-10-29 DIAGNOSIS — I480 Paroxysmal atrial fibrillation: Secondary | ICD-10-CM | POA: Insufficient documentation

## 2019-10-29 DIAGNOSIS — Z7901 Long term (current) use of anticoagulants: Secondary | ICD-10-CM | POA: Insufficient documentation

## 2019-10-29 HISTORY — DX: Presence of other cardiac implants and grafts: Z95.818

## 2019-10-29 LAB — BASIC METABOLIC PANEL
Anion gap: 10 (ref 5–15)
BUN: 17 mg/dL (ref 8–23)
CO2: 25 mmol/L (ref 22–32)
Calcium: 9.1 mg/dL (ref 8.9–10.3)
Chloride: 103 mmol/L (ref 98–111)
Creatinine, Ser: 0.5 mg/dL (ref 0.44–1.00)
GFR calc Af Amer: >60 mL/min (ref >60)
GFR calc non Af Amer: >60 mL/min (ref >60)
Glucose, Bld: 125 mg/dL — ABNORMAL HIGH (ref 70–99)
Potassium: 3.5 mmol/L (ref 3.5–5.1)
Sodium: 138 mmol/L (ref 135–145)

## 2019-10-30 ENCOUNTER — Other Ambulatory Visit (HOSPITAL_COMMUNITY)
Admission: RE | Admit: 2019-10-30 | Discharge: 2019-10-30 | Disposition: A | Discharge status: Home / Self Care | Payer: Medicare Other | Source: Ambulatory Visit | Attending: Gastroenterology | Admitting: Gastroenterology

## 2019-10-30 DIAGNOSIS — Z01812 Encounter for preprocedural laboratory examination: Secondary | ICD-10-CM | POA: Diagnosis not present

## 2019-10-30 DIAGNOSIS — Z20828 Contact with and (suspected) exposure to other viral communicable diseases: Secondary | ICD-10-CM | POA: Insufficient documentation

## 2019-10-30 LAB — SARS CORONAVIRUS 2 (TAT 6-24 HRS): SARS Coronavirus 2: NEGATIVE

## 2019-10-31 NOTE — Telephone Encounter (Signed)
REVIEWED-NO ADDITIONAL RECOMMENDATIONS. 

## 2019-11-02 ENCOUNTER — Encounter (HOSPITAL_COMMUNITY)
Admission: RE | Disposition: A | Discharge status: Home / Self Care | Payer: Self-pay | Source: Home / Self Care | Attending: Gastroenterology

## 2019-11-02 ENCOUNTER — Ambulatory Visit (HOSPITAL_COMMUNITY)
Admission: RE | Admit: 2019-11-02 | Discharge: 2019-11-02 | Disposition: A | Discharge status: Home / Self Care | Payer: Medicare Other | Attending: Gastroenterology | Admitting: Gastroenterology

## 2019-11-02 ENCOUNTER — Ambulatory Visit (HOSPITAL_COMMUNITY): Payer: Medicare Other | Admitting: Anesthesiology

## 2019-11-02 ENCOUNTER — Encounter (HOSPITAL_COMMUNITY): Payer: Self-pay | Admitting: *Deleted

## 2019-11-02 DIAGNOSIS — K635 Polyp of colon: Secondary | ICD-10-CM

## 2019-11-02 DIAGNOSIS — Z1211 Encounter for screening for malignant neoplasm of colon: Secondary | ICD-10-CM | POA: Insufficient documentation

## 2019-11-02 DIAGNOSIS — J45909 Unspecified asthma, uncomplicated: Secondary | ICD-10-CM | POA: Diagnosis not present

## 2019-11-02 DIAGNOSIS — D122 Benign neoplasm of ascending colon: Secondary | ICD-10-CM | POA: Insufficient documentation

## 2019-11-02 DIAGNOSIS — Z79899 Other long term (current) drug therapy: Secondary | ICD-10-CM | POA: Insufficient documentation

## 2019-11-02 DIAGNOSIS — Z86010 Personal history of colonic polyps: Secondary | ICD-10-CM

## 2019-11-02 DIAGNOSIS — M1990 Unspecified osteoarthritis, unspecified site: Secondary | ICD-10-CM | POA: Insufficient documentation

## 2019-11-02 DIAGNOSIS — I480 Paroxysmal atrial fibrillation: Secondary | ICD-10-CM | POA: Diagnosis not present

## 2019-11-02 DIAGNOSIS — Q438 Other specified congenital malformations of intestine: Secondary | ICD-10-CM | POA: Diagnosis not present

## 2019-11-02 DIAGNOSIS — K219 Gastro-esophageal reflux disease without esophagitis: Secondary | ICD-10-CM | POA: Diagnosis not present

## 2019-11-02 DIAGNOSIS — K648 Other hemorrhoids: Secondary | ICD-10-CM | POA: Insufficient documentation

## 2019-11-02 DIAGNOSIS — I10 Essential (primary) hypertension: Secondary | ICD-10-CM | POA: Insufficient documentation

## 2019-11-02 DIAGNOSIS — K644 Residual hemorrhoidal skin tags: Secondary | ICD-10-CM | POA: Insufficient documentation

## 2019-11-02 DIAGNOSIS — H409 Unspecified glaucoma: Secondary | ICD-10-CM | POA: Diagnosis not present

## 2019-11-02 DIAGNOSIS — Z7901 Long term (current) use of anticoagulants: Secondary | ICD-10-CM | POA: Insufficient documentation

## 2019-11-02 HISTORY — PX: COLONOSCOPY WITH PROPOFOL: SHX5780

## 2019-11-02 HISTORY — PX: POLYPECTOMY: SHX5525

## 2019-11-02 SURGERY — COLONOSCOPY WITH PROPOFOL
Anesthesia: General

## 2019-11-02 MED ORDER — CHLORHEXIDINE GLUCONATE CLOTH 2 % EX PADS: 6.0000 | MEDICATED_PAD | Freq: Once | CUTANEOUS | Status: DC

## 2019-11-02 MED ORDER — KETAMINE HCL 50 MG/5ML IJ SOSY
PREFILLED_SYRINGE | INTRAMUSCULAR | Status: AC
  Filled 2019-11-02: qty 5

## 2019-11-02 MED ORDER — PROPOFOL 10 MG/ML IV BOLUS
INTRAVENOUS | Status: DC | PRN
  Administered 2019-11-02 (×2): 20 mg via INTRAVENOUS
  Administered 2019-11-02: 60 mg via INTRAVENOUS

## 2019-11-02 MED ORDER — LIDOCAINE HCL (CARDIAC) PF 100 MG/5ML IV SOSY
PREFILLED_SYRINGE | INTRAVENOUS | Status: DC | PRN
  Administered 2019-11-02: 40 mg via INTRAVENOUS

## 2019-11-02 MED ORDER — PROPOFOL 500 MG/50ML IV EMUL
INTRAVENOUS | Status: DC | PRN
  Administered 2019-11-02: 125 ug/kg/min via INTRAVENOUS

## 2019-11-02 MED ORDER — LACTATED RINGERS IV SOLN
Freq: Once | INTRAVENOUS | Status: AC
  Administered 2019-11-02: 11:00:00 1000 mL via INTRAVENOUS

## 2019-11-02 MED ORDER — LACTATED RINGERS IV SOLN
INTRAVENOUS | Status: DC | PRN
  Administered 2019-11-02: 11:00:00 via INTRAVENOUS

## 2019-11-02 NOTE — Discharge Instructions (Signed)
You had 1 small polyp removed. You have SMALL internal hemorrhoids.    Re-start XARELTO ON NOV 17.  DRINK WATER TO KEEP YOUR URINE LIGHT YELLOW.  FOLLOW A HIGH FIBER DIET. AVOID ITEMS THAT CAUSE BLOATING & GAS. SEE INFO BELOW.   YOUR BIOPSY RESULTS WILL BE BACK IN 5 BUSINESS DAYS.  Next colonoscopy in 5 years.    Colonoscopy Care After Read the instructions outlined below and refer to this sheet in the next week. These discharge instructions provide you with general information on caring for yourself after you leave the hospital. While your treatment has been planned according to the most current medical practices available, unavoidable complications occasionally occur. If you have any problems or questions after discharge, call DR. FIELDS, [phone].  ACTIVITY  You may resume your regular activity, but move at a slower pace for the next 24 hours.   Take frequent rest periods for the next 24 hours.   Walking will help get rid of the air and reduce the bloated feeling in your belly (abdomen).   No driving for 24 hours (because of the medicine (anesthesia) used during the test).   You may shower.   Do not sign any important legal documents or operate any machinery for 24 hours (because of the anesthesia used during the test).    NUTRITION  Drink plenty of fluids.   You may resume your normal diet as instructed by your doctor.   Begin with a light meal and progress to your normal diet. Heavy or fried foods are harder to digest and may make you feel sick to your stomach (nauseated).   Avoid alcoholic beverages for 24 hours or as instructed.    MEDICATIONS  You may resume your normal medications.   WHAT YOU CAN EXPECT TODAY  Some feelings of bloating in the abdomen.   Passage of more gas than usual.   Spotting of blood in your stool or on the toilet paper  .  IF YOU HAD POLYPS REMOVED DURING THE COLONOSCOPY:  Eat a soft diet IF YOU HAVE NAUSEA, BLOATING,  ABDOMINAL PAIN, OR VOMITING.    FINDING OUT THE RESULTS OF YOUR TEST Not all test results are available during your visit. DR. Oneida Alar WILL CALL YOU WITHIN 14 DAYS OF YOUR PROCEDUE WITH YOUR RESULTS. Do not assume everything is normal if you have not heard from DR. FIELDS, CALL HER OFFICE AT [phone].  SEEK IMMEDIATE MEDICAL ATTENTION AND CALL THE OFFICE: [phone] IF:  You have more than a spotting of blood in your stool.   Your belly is swollen (abdominal distention).   You are nauseated or vomiting.   You have a temperature over 101F.   You have abdominal pain or discomfort that is severe or gets worse throughout the day.   High-Fiber Diet A high-fiber diet changes your normal diet to include more whole grains, legumes, fruits, and vegetables. Changes in the diet involve replacing refined carbohydrates with unrefined foods. The calorie level of the diet is essentially unchanged. The Dietary Reference Intake (recommended amount) for adult males is 38 grams per day. For adult females, it is 25 grams per day. Pregnant and lactating women should consume 28 grams of fiber per day. Fiber is the intact part of a plant that is not broken down during digestion. Functional fiber is fiber that has been isolated from the plant to provide a beneficial effect in the body.  PURPOSE  Increase stool bulk.   Ease and regulate bowel movements.  Lower cholesterol.   REDUCE RISK OF COLON CANCER  INDICATIONS THAT YOU NEED MORE FIBER  Constipation and hemorrhoids.   Uncomplicated diverticulosis (intestine condition) and irritable bowel syndrome.   Weight management.   As a protective measure against hardening of the arteries (atherosclerosis), diabetes, and cancer.   GUIDELINES FOR INCREASING FIBER IN THE DIET  Start adding fiber to the diet slowly. A gradual increase of about 5 more grams (2 servings of most fruits or vegetables) per day is best. Too rapid an increase in fiber may  result in constipation, flatulence, and bloating.   Drink enough water and fluids to keep your urine clear or pale yellow. Water, juice, or caffeine-free drinks are recommended. Not drinking enough fluid may cause constipation.   Eat a variety of high-fiber foods rather than one type of fiber.   Try to increase your intake of fiber through using high-fiber foods rather than fiber pills or supplements that contain small amounts of fiber.   The goal is to change the types of food eaten. Do not supplement your present diet with high-fiber foods, but replace foods in your present diet.    Polyps, Colon  A polyp is extra tissue that grows inside your body. Colon polyps grow in the large intestine. The large intestine, also called the colon, is part of your digestive system. It is a long, hollow tube at the end of your digestive tract where your body makes and stores stool. Most polyps are not dangerous. They are benign. This means they are not cancerous. But over time, some types of polyps can turn into cancer. Polyps that are smaller than a pea are usually not harmful. But larger polyps could someday become or may already be cancerous. To be safe, doctors remove all polyps and test them.   WHO GETS POLYPS? Anyone can get polyps, but certain people are more likely than others. You may have a greater chance of getting polyps if:  You are over 50.   You have had polyps before.   Someone in your family has had polyps.   Someone in your family has had cancer of the large intestine.   Find out if someone in your family has had polyps. You may also be more likely to get polyps if you:   Eat a lot of fatty foods   Smoke   Drink alcohol   Do not exercise  Eat too much   PREVENTION There is not one sure way to prevent polyps. You might be able to lower your risk of getting them if you:  Eat more fruits and vegetables and less fatty food.   Do not smoke.   Avoid alcohol.   Exercise every  day.   Lose weight if you are overweight.   Eating more calcium and folate can also lower your risk of getting polyps. Some foods that are rich in calcium are milk, cheese, and broccoli. Some foods that are rich in folate are chickpeas, kidney beans, and spinach.

## 2019-11-02 NOTE — Transfer of Care (Signed)
Immediate Anesthesia Transfer of Care Note  Patient: Heather Cunningham  Procedure(s) Performed: COLONOSCOPY WITH PROPOFOL (N/A ) POLYPECTOMY  Patient Location: PACU  Anesthesia Type:General  Level of Consciousness: awake, alert  and oriented  Airway & Oxygen Therapy: Patient Spontanous Breathing  Post-op Assessment: Report given to RN, Post -op Vital signs reviewed and stable and Patient moving all extremities X 4  Post vital signs: Reviewed and stable  Last Vitals:  Vitals Value Taken Time  BP 109/62 11/02/19 1206  Temp 36.7 C 11/02/19 1206  Pulse 57 11/02/19 1212  Resp 19 11/02/19 1212  SpO2 98 % 11/02/19 1212  Vitals shown include unvalidated device data.  Last Pain:  Vitals:   11/02/19 1206  TempSrc:   PainSc: 0-No pain      Patients Stated Pain Goal: 5 ([id number])  Complications: No apparent anesthesia complications

## 2019-11-02 NOTE — Progress Notes (Signed)
CC'D TO PCP °

## 2019-11-02 NOTE — Op Note (Signed)
[facility] Patient Name: Heather Cunningham Procedure Date: 11/02/2019 11:11 AM MRN: [id number] Date of Birth: 1951/01/07 Attending MD: [name] MD, MD CSN: [id number] Age: 68 Admit Type: Outpatient Procedure:                ColonoscopyWITH COLD SNARE POLYPECTOMY Indications:              Personal history of colonic polyps-5 SIMPLE                            ADENOMAS REMOVED IN NOV 2016. Providers:                [name] MD, MD, Janeece Riggers, RN, Aram Candela Referring MD:             [name], MD Medicines:                Propofol per Anesthesia Complications:            No immediate complications. Estimated Blood Loss:     Estimated blood loss was minimal. Procedure:                Pre-Anesthesia Assessment:                           - Prior to the procedure, a History and Physical                            was performed, and patient medications and                            allergies were reviewed. The patient's tolerance of                            previous anesthesia was also reviewed. The risks                            and benefits of the procedure and the sedation                            options and risks were discussed with the patient.                            All questions were answered, and informed consent                            was obtained. Prior Anticoagulants: The patient has                            taken Xarelto (rivaroxaban), last dose was 2 days                            prior to procedure. ASA Grade Assessment: II - A                            patient with mild systemic disease. After reviewing  the risks and benefits, the patient was deemed in                            satisfactory condition to undergo the procedure.                            After obtaining informed consent, the colonoscope                            was passed under direct vision. Throughout the                            procedure, the  patient's blood pressure, pulse, and                            oxygen saturations were monitored continuously. The                            [id number]) scope was introduced through                            the anus and advanced to the the cecum, identified                            by appendiceal orifice and ileocecal valve. The                            colonoscopy was somewhat difficult due to a                            tortuous colon. Successful completion of the                            procedure was aided by straightening and shortening                            the scope to obtain bowel loop reduction and                            COLOWRAP. The patient tolerated the procedure                            fairly well. The quality of the bowel preparation                            was excellent. The ileocecal valve, appendiceal                            orifice, and rectum were photographed. Scope In: 11:41:09 AM Scope Out: 12:00:29 PM Scope Withdrawal Time: 0 hours 14 minutes 46 seconds  Total Procedure Duration: 0 hours 19 minutes 20 seconds  Findings:      A 3 mm polyp was found in the distal ascending colon. The polyp was  sessile. The polyp was removed with a cold snare. Resection and       retrieval were complete.      External and internal hemorrhoids were found.      The recto-sigmoid colon, sigmoid colon and descending colon were       moderately tortuous. Impression:               - One 3 mm polyp in the distal ascending colon,                            removed with a cold snare. Resected and retrieved.                           - External and internal hemorrhoids.                           - Tortuous colon. Moderate Sedation:      Per Anesthesia Care Recommendation:           - Patient has a contact number available for                            emergencies. The signs and symptoms of potential                            delayed complications  were discussed with the                            patient. Return to normal activities tomorrow.                            Written discharge instructions were provided to the                            patient.                           - High fiber diet.                           - Continue present medications.                           - Await pathology results.                           - Repeat colonoscopy in 5 years for surveillance. Procedure Code(s):        --- Professional ---                           [phone], Colonoscopy, flexible; with removal of                            tumor(s), polyp(s), or other lesion(s) by snare                            technique Diagnosis Code(s):        ---  Professional ---                           K63.5, Polyp of colon                           K64.8, Other hemorrhoids                           Z86.010, Personal history of colonic polyps                           Q43.8, Other specified congenital malformations of                            intestine CPT copyright 2019 American Medical Association. All rights reserved. The codes documented in this report are preliminary and upon coder review may  be revised to meet current compliance requirements. [name], MD [name] MD, MD 11/02/2019 12:12:42 PM This report has been signed electronically. Number of Addenda: 0

## 2019-11-02 NOTE — H&P (Signed)
Primary Care Physician:  [name], MD Primary Gastroenterologist:  Dr. Oneida Alar  Pre-Procedure History & Physical: HPI:  Heather Cunningham is a 68 y.o. female here for  [place].  Past Medical History:  Diagnosis Date  . Arthritis    back  . Asthma    reactive airway around cats and dogs  . Bilateral carpal tunnel syndrome   . Cancer (HCC)    Skin  . GERD (gastroesophageal reflux disease)   . Glaucoma   . Hypertension   . Paroxysmal atrial fibrillation (HCC)   . PONV (postoperative nausea and vomiting)   . Premature ventricular contraction   . Status post placement of implantable loop recorder     Past Surgical History:  Procedure Laterality Date  . BREAST EXCISIONAL BIOPSY Left 1996  . BREAST EXCISIONAL BIOPSY Right 1993  . BREAST LUMPECTOMY    . CATARACT EXTRACTION    . COLONOSCOPY N/A 09/05/2015   Procedure: COLONOSCOPY;  Surgeon: [name], MD;  [facility];  Service: Endoscopy;  Laterality: N/A;  incomplete colonoscopy  . COLONOSCOPY WITH PROPOFOL N/A 11/01/2015   [id number] hemorrhoids/five polyps/mild diverticulosis, tubular adenomas.  Next colonoscopy 3 years with propofol with overtube.  Marland Kitchen EYE SURGERY    . HYSTEROSCOPY W/D&C N/A 12/31/2016   Procedure: DILATATION AND CURETTAGE /HYSTEROSCOPY, POSSIBLE REMOVAL OF ENDOMETRIAL LESION;  Surgeon: [name], MD;  Location: [facility];  Service: Gynecology;  Laterality: N/A;  . implantable loop recorder placement  09/28/2019   Medtronic Reveal [place] model LNQ22 (SN [id number]) implantable loop recorder implanted by Dr Rayann Heman in office   . Maxiofacial    . POLYPECTOMY  11/01/2015   Procedure: POLYPECTOMY;  Surgeon: [name], MD;  Location: [facility];  Service: Endoscopy;;  . [place]  . TUBAL LIGATION      Prior to Admission medications   Medication Sig Start Date End Date Taking? Authorizing Provider  acetaminophen (TYLENOL) 500 MG tablet Take 500-1,000  mg by mouth every 6 (six) hours as needed for mild pain.    Yes [provider]  Ascorbic Acid (VITAMIN C) 1000 MG tablet Take 1,000 mg by mouth every other day.   Yes [provider]  calcium carbonate (TUMS - DOSED IN MG ELEMENTAL CALCIUM) 500 MG chewable tablet Chew 1 tablet by mouth every other day. Alternates between tums and omeprazole in the evening before supper   Yes [provider]  Calcium Carbonate-Vitamin D (CALTRATE 600+D) 600-400 MG-UNIT per tablet Take 1 tablet by mouth every evening.    Yes [provider]  diltiazem (CARDIZEM) 30 MG tablet Take 1 tablet (30 mg total) by mouth daily as needed (Palpitations). Patient taking differently: Take 30 mg by mouth 4 (four) times daily as needed (episode of a-fib).  09/26/18  Yes Herminio Commons, MD  docusate sodium (COLACE) 100 MG capsule Take 100 mg by mouth 2 (two) times daily.    Yes [provider]  dorzolamide (TRUSOPT) 2 % ophthalmic solution Place 1 drop into the right eye 2 (two) times daily.   Yes [provider]  latanoprost (XALATAN) 0.005 % ophthalmic solution Place 1 drop into both eyes at bedtime.   Yes [provider]  lisinopril-hydrochlorothiazide (PRINZIDE,ZESTORETIC) 20-25 MG tablet Take 1 tablet by mouth at bedtime.  01/04/16  Yes [provider]  meclizine (ANTIVERT) 25 MG tablet Take 25 mg by mouth 3 (three) times daily as needed for dizziness.  Yes [provider]  Melatonin 3 MG CAPS Take 3 mg by mouth every other day. At bedtime for sleep   Yes [provider]  metoprolol tartrate (LOPRESSOR) 50 MG tablet TAKE 1 TABLET BY MOUTH TWICE DAILY Patient taking differently: Take 50 mg by mouth 2 (two) times daily.  09/14/19  Yes Herminio Commons, MD  omeprazole (PRILOSEC) 40 MG capsule Take 40 mg by mouth every other day. In the evening before supper   Yes [provider]  simvastatin (ZOCOR) 20 MG tablet Take 20 mg by  mouth daily at 6 PM.    Yes [provider]  XARELTO 20 MG TABS tablet TAKE 1 TABLET BY MOUTH EVERY DAY WITH SUPPER Patient taking differently: Take 20 mg by mouth daily with supper.  06/18/19  Yes Herminio Commons, MD  albuterol (PROAIR HFA) 108 (90 BASE) MCG/ACT inhaler Inhale 2 puffs into the lungs every 6 (six) hours as needed for wheezing or shortness of breath.     [provider]    Allergies as of 08/20/2019 - Review Complete 08/14/2019  Allergen Reaction Noted  . Other  10/29/2015    Family History  Problem Relation Age of Onset  . Cancer Other   . Heart failure Other   . Colon cancer Mother   . Colon cancer Maternal Grandmother   . Colon polyps Neg Hx     Social History   Socioeconomic History  . Marital status: Married    Spouse name: [name]  . Number of children: 1  . Years of education: Not on file  . Highest education level: Not on file  Occupational History  . Occupation: [employer]    Employer: OTHER  Social Needs  . Financial resource strain: Not on file  . Food insecurity    Worry: Not on file    Inability: Not on file  . Transportation needs    Medical: Not on file    Non-medical: Not on file  Tobacco Use  . Smoking status: Former Smoker    Packs/day: 1.00    Years: 10.00    Pack years: 10.00    Types: Cigarettes    Start date: 12/17/1966    Quit date: 12/17/1976    Years since quitting: 42.9  . Smokeless tobacco: Never Used  . Tobacco comment: QUIT 35 YEARS AGO  Substance and Sexual Activity  . Alcohol use: No    Alcohol/week: 0.0 standard drinks  . Drug use: No  . Sexual activity: Not on file  Lifestyle  . Physical activity    Days per week: Not on file    Minutes per session: Not on file  . Stress: Not on file  Relationships  . Social Herbalist on phone: Not on file    Gets together: Not on file    Attends religious service: Not on file    Active member of club or organization: Not on  file    Attends meetings of clubs or organizations: Not on file    Relationship status: Not on file  . Intimate partner violence    Fear of current or ex partner: Not on file    Emotionally abused: Not on file    Physically abused: Not on file    Forced sexual activity: Not on file  Other Topics Concern  . Not on file  Social History Narrative   Pt gets regular exercise. 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 WORKED FOR [employer] [place]. KIDS: [place], [place] WAS IN THE [employer].    Review of Systems: See HPI, otherwise negative ROS   Physical Exam: BP 130/74   Pulse 60   Temp 97.9 F (36.6 C) (Oral)   Resp 19   Ht 5\' 4"  (1.626 m)   Wt 77.1 kg   SpO2 97%   BMI 29.18 kg/m  General:   Alert,  pleasant and cooperative in NAD Head:  Normocephalic and atraumatic. Neck:  Supple; Lungs:  Clear throughout to auscultation.    Heart:  Regular rate and rhythm. Abdomen:  Soft, nontender and nondistended. Normal bowel sounds, without guarding, and without rebound.   Neurologic:  Alert and  oriented x4;  grossly normal neurologically.  Impression/Plan:      PERSONAL HISTORY OF POLYPS.  PLAN: 1. TCS TODAY. DISCUSSED PROCEDURE, BENEFITS, & RISKS: < 1% chance of medication reaction, bleeding, perforation, ASPIRATION, or rupture of spleen/liver requiring surgery to fix it and missed polyps < 1 cm 10-20% of the time.

## 2019-11-02 NOTE — Anesthesia Preprocedure Evaluation (Addendum)
Anesthesia Evaluation  Patient identified by MRN, date of birth, ID band Patient awake    Reviewed: Allergy & Precautions, NPO status , Patient's Chart, lab work & pertinent test results  History of Anesthesia Complications (+) PONV and history of anesthetic complications  Airway Mallampati: III  TM Distance: >3 FB Neck ROM: Full   Comment: Neck pain Dental  (+) Partial Upper, Missing, Caps   Pulmonary asthma , former smoker,    Pulmonary exam normal breath sounds clear to auscultation       Cardiovascular Exercise Tolerance: Good hypertension, Pt. on medications + CAD  Normal cardiovascular exam+ dysrhythmias Atrial Fibrillation  Rhythm:Regular Rate:Normal     Neuro/Psych  Neuromuscular disease    GI/Hepatic GERD  ,  Endo/Other    Renal/GU      Musculoskeletal  (+) Arthritis , Osteoarthritis,  Neck pain   Abdominal   Peds  Hematology   Anesthesia Other Findings Essential hypertension CAD, NATIVE VESSEL Atrial fibrillation (HCC) Plantar fasciitis, bilateral Cervical spine arthritis Carpal tunnel syndrome Cervical radicular pain Hemorrhoids, internal Palpitations PVC's (premature ventricular contractions) GERD (gastroesophageal reflux disease) Colon adenomas    Reproductive/Obstetrics                            Anesthesia Physical Anesthesia Plan  ASA: III  Anesthesia Plan: General   Post-op Pain Management:    Induction: Intravenous  PONV Risk Score and Plan: TIVA  Airway Management Planned: Natural Airway, Nasal Cannula and Simple Face Mask  Additional Equipment:   Intra-op Plan:   Post-operative Plan:   Informed Consent: I have reviewed the patients History and Physical, chart, labs and discussed the procedure including the risks, benefits and alternatives for the proposed anesthesia with the patient or authorized representative who has indicated his/her  understanding and acceptance.     Dental advisory given  Plan Discussed with: CRNA  Anesthesia Plan Comments:        Anesthesia Quick Evaluation

## 2019-11-03 ENCOUNTER — Ambulatory Visit (INDEPENDENT_AMBULATORY_CARE_PROVIDER_SITE_OTHER): Payer: Medicare Other | Admitting: *Deleted

## 2019-11-03 DIAGNOSIS — I480 Paroxysmal atrial fibrillation: Secondary | ICD-10-CM

## 2019-11-03 LAB — SURGICAL PATHOLOGY

## 2019-11-03 NOTE — Anesthesia Postprocedure Evaluation (Signed)
Anesthesia Post Note  Patient: Heather Cunningham  Procedure(s) Performed: COLONOSCOPY WITH PROPOFOL (N/A ) POLYPECTOMY  Patient location during evaluation: PACU Anesthesia Type: General Level of consciousness: awake and alert and oriented Pain management: pain level not controlled Vital Signs Assessment: post-procedure vital signs reviewed and stable Respiratory status: spontaneous breathing Cardiovascular status: blood pressure returned to baseline and stable Postop Assessment: no apparent nausea or vomiting Anesthetic complications: no     Last Vitals:  Vitals:   11/02/19 1222 11/02/19 1233  BP: 125/69 137/61  Pulse: (!) 50 (!) 52  Resp: 19 18  Temp:  36.6 C  SpO2: 99% 96%    Last Pain:  Vitals:   11/02/19 1233  TempSrc: Axillary  PainSc: 0-No pain                 ,

## 2019-11-04 LAB — CUP PACEART REMOTE DEVICE CHECK
Date Time Interrogation Session: 20201118010730
Implantable Pulse Generator Implant Date: 20201012

## 2019-11-05 ENCOUNTER — Encounter (HOSPITAL_COMMUNITY): Payer: Self-pay | Admitting: Gastroenterology

## 2019-12-01 NOTE — Progress Notes (Signed)
Carelink Summary Report / Loop Recorder 

## 2019-12-07 ENCOUNTER — Ambulatory Visit (INDEPENDENT_AMBULATORY_CARE_PROVIDER_SITE_OTHER): Payer: Medicare Other | Admitting: *Deleted

## 2019-12-07 DIAGNOSIS — I480 Paroxysmal atrial fibrillation: Secondary | ICD-10-CM

## 2019-12-07 LAB — CUP PACEART REMOTE DEVICE CHECK
Date Time Interrogation Session: 20201220200941
Implantable Pulse Generator Implant Date: 20201012

## 2019-12-16 ENCOUNTER — Other Ambulatory Visit: Payer: Self-pay | Admitting: Cardiovascular Disease

## 2019-12-23 ENCOUNTER — Telehealth: Payer: Self-pay

## 2019-12-28 ENCOUNTER — Telehealth (INDEPENDENT_AMBULATORY_CARE_PROVIDER_SITE_OTHER): Payer: Medicare Other | Admitting: Internal Medicine

## 2019-12-28 ENCOUNTER — Other Ambulatory Visit: Payer: Self-pay

## 2019-12-28 ENCOUNTER — Other Ambulatory Visit: Payer: Self-pay | Admitting: Family Medicine

## 2019-12-28 DIAGNOSIS — I493 Ventricular premature depolarization: Secondary | ICD-10-CM

## 2019-12-28 DIAGNOSIS — Z1231 Encounter for screening mammogram for malignant neoplasm of breast: Secondary | ICD-10-CM

## 2019-12-28 DIAGNOSIS — I10 Essential (primary) hypertension: Secondary | ICD-10-CM

## 2019-12-28 DIAGNOSIS — R002 Palpitations: Secondary | ICD-10-CM | POA: Diagnosis not present

## 2019-12-28 DIAGNOSIS — I480 Paroxysmal atrial fibrillation: Secondary | ICD-10-CM | POA: Diagnosis not present

## 2019-12-28 NOTE — Progress Notes (Signed)
Electrophysiology TeleHealth Note  Due to national recommendations of social distancing due to [place] 19, an audio telehealth visit is felt to be most appropriate for this patient at this time.  Verbal consent was obtained by me for the telehealth visit today.  The patient does not have capability for a virtual visit.  A phone visit is therefore required today.   Date:  12/28/2019   ID:  Heather Cunningham, DOB 1951/01/04, MRN [id number]  Location: patient's home  Provider location:  [facility]  Evaluation Performed: Follow-up visit  PCP:  [name], MD   Electrophysiologist:  Dr Rayann Heman  Chief Complaint:  palpitations  History of Present Illness:    Heather Cunningham is a 69 y.o. female who presents via telehealth conferencing today.  Since last being seen in our clinic, the patient reports doing very well.  Today, she denies symptoms of palpitations, chest pain, shortness of breath,  lower extremity edema, dizziness, presyncope, or syncope.  The patient is otherwise without complaint today.  The patient denies symptoms of fevers, chills, cough, or new SOB worrisome for COVID 19.  Past Medical History:  Diagnosis Date  . Arthritis    back  . Asthma    reactive airway around cats and dogs  . Bilateral carpal tunnel syndrome   . Cancer (HCC)    Skin  . GERD (gastroesophageal reflux disease)   . Glaucoma   . Hypertension   . Paroxysmal atrial fibrillation (HCC)   . PONV (postoperative nausea and vomiting)   . Premature ventricular contraction   . Status post placement of implantable loop recorder     Past Surgical History:  Procedure Laterality Date  . BREAST EXCISIONAL BIOPSY Left 1996  . BREAST EXCISIONAL BIOPSY Right 1993  . BREAST LUMPECTOMY    . CATARACT EXTRACTION    . COLONOSCOPY N/A 09/05/2015   Procedure: COLONOSCOPY;  Surgeon: [name], MD;  [facility];  Service: Endoscopy;  Laterality: N/A;  incomplete colonoscopy  . COLONOSCOPY WITH PROPOFOL N/A  11/01/2015   [id number] hemorrhoids/five polyps/mild diverticulosis, tubular adenomas.  Next colonoscopy 3 years with propofol with overtube.  . COLONOSCOPY WITH PROPOFOL N/A 11/02/2019   Procedure: COLONOSCOPY WITH PROPOFOL;  Surgeon: [name], MD;  [facility];  Service: Endoscopy;  Laterality: N/A;  2:00pm with overtube  . EYE SURGERY    . HYSTEROSCOPY WITH D & C N/A 12/31/2016   Procedure: DILATATION AND CURETTAGE /HYSTEROSCOPY, POSSIBLE REMOVAL OF ENDOMETRIAL LESION;  Surgeon: [name], MD;  Location: [facility];  Service: Gynecology;  Laterality: N/A;  . implantable loop recorder placement  09/28/2019   Medtronic Reveal [place] model LNQ22 (SN [id number]) implantable loop recorder implanted by Dr Rayann Heman in office   . Maxiofacial    . POLYPECTOMY  11/01/2015   Procedure: POLYPECTOMY;  Surgeon: [name], MD;  Location: [facility];  Service: Endoscopy;;  . POLYPECTOMY  11/02/2019   Procedure: POLYPECTOMY;  Surgeon: [name], MD;  [facility];  Service: Endoscopy;;  . [place]  . TUBAL LIGATION      Current Outpatient Medications  Medication Sig Dispense Refill  . acetaminophen (TYLENOL) 500 MG tablet Take 500-1,000 mg by mouth every 6 (six) hours as needed for mild pain.     Marland Kitchen albuterol (PROAIR HFA) 108 (90 BASE) MCG/ACT inhaler Inhale 2 puffs into the lungs every 6 (six) hours as needed for wheezing or shortness of  breath.     . Ascorbic Acid (VITAMIN C) 1000 MG tablet Take 1,000 mg by mouth every other day.    . calcium carbonate (TUMS - DOSED IN MG ELEMENTAL CALCIUM) 500 MG chewable tablet Chew 1 tablet by mouth every other day. Alternates between tums and omeprazole in the evening before supper    . Calcium Carbonate-Vitamin D (CALTRATE 600+D) 600-400 MG-UNIT per tablet Take 1 tablet by mouth every evening.     . diltiazem (CARDIZEM) 30 MG tablet Take 1 tablet (30 mg total) by mouth daily as needed (Palpitations).  (Patient taking differently: Take 30 mg by mouth 4 (four) times daily as needed (episode of a-fib). ) 30 tablet 6  . docusate sodium (COLACE) 100 MG capsule Take 100 mg by mouth 2 (two) times daily.     . dorzolamide (TRUSOPT) 2 % ophthalmic solution Place 1 drop into the right eye 2 (two) times daily.    Marland Kitchen latanoprost (XALATAN) 0.005 % ophthalmic solution Place 1 drop into both eyes at bedtime.    Marland Kitchen lisinopril-hydrochlorothiazide (PRINZIDE,ZESTORETIC) 20-25 MG tablet Take 1 tablet by mouth at bedtime.     . meclizine (ANTIVERT) 25 MG tablet Take 25 mg by mouth 3 (three) times daily as needed for dizziness.    . Melatonin 3 MG CAPS Take 3 mg by mouth every other day. At bedtime for sleep    . metoprolol tartrate (LOPRESSOR) 50 MG tablet TAKE 1 TABLET BY MOUTH TWICE DAILY (Patient taking differently: Take 50 mg by mouth 2 (two) times daily. ) 180 tablet 3  . omeprazole (PRILOSEC) 40 MG capsule Take 40 mg by mouth every other day. In the evening before supper    . simvastatin (ZOCOR) 20 MG tablet Take 20 mg by mouth daily at 6 PM.     . XARELTO 20 MG TABS tablet TAKE 1 TABLET BY MOUTH EVERY DAY WITH SUPPER 30 tablet 6   No current facility-administered medications for this visit.    Allergies:   Other   Social History:  The patient  reports that she quit smoking about 43 years ago. Her smoking use included cigarettes. She started smoking about 53 years ago. She has a 10.00 pack-year smoking history. She has never used smokeless tobacco. She reports that she does not drink alcohol or use drugs.   Family History:  The patient's family history includes Cancer in an other family member; Colon cancer in her maternal grandmother and mother; Heart failure in an other family member.   ROS:  Please see the history of present illness.   All other systems are personally reviewed and negative.    Exam:    Vital Signs:  There were no vitals taken for this visit.  Well sounding, alert and conversant    Labs/Other Tests and Data Reviewed:    Recent Labs: 02/05/2019: Hemoglobin 13.5; Magnesium 2.1; Platelets 260; TSH 1.692 10/29/2019: BUN 17; Creatinine, Ser 0.50; Potassium 3.5; Sodium 138   Wt Readings from Last 3 Encounters:  11/02/19 170 lb (77.1 kg)  09/28/19 170 lb (77.1 kg)  09/16/19 169 lb (76.7 kg)     Last device remote is reviewed from [facility] which reveals normal device function, no arrhythmias, PVC burden is 0.1%   ASSESSMENT & PLAN:    1.  Palpitations No arrhythmias by ILR She has a history of atrial fibrillation, PACs and PVCs.  Her only arrhythmia on ILR are PVCs. Her burden is 0.1%. No changes at this time She is on  xarelto for chads2vasc score of 3.  If her AF burden requires quiescent, we may eventually consider stopping anticoagulation with close follow-up  2. HTN Stable No change required today   Follow-up:  With Dr Bronson Ing as scheduled Return to see EP PA in 6 months Continue remote follow-up   Patient Risk:  after full review of this patients clinical status, I feel that they are at moderate risk at this time.  Today, I have spent 15 minutes with the patient with telehealth technology discussing arrhythmia management .    SignedThompson Grayer, MD  12/28/2019 1:39 PM     [address] [place] [address] [phone] (office) [phone] (fax)

## 2020-01-11 ENCOUNTER — Ambulatory Visit (INDEPENDENT_AMBULATORY_CARE_PROVIDER_SITE_OTHER): Payer: Medicare Other | Admitting: *Deleted

## 2020-01-11 DIAGNOSIS — I480 Paroxysmal atrial fibrillation: Secondary | ICD-10-CM | POA: Diagnosis not present

## 2020-01-11 LAB — CUP PACEART REMOTE DEVICE CHECK
Date Time Interrogation Session: 20210124230122
Implantable Pulse Generator Implant Date: 20201012

## 2020-01-18 ENCOUNTER — Telehealth: Payer: Self-pay | Admitting: Internal Medicine

## 2020-01-18 NOTE — Telephone Encounter (Signed)
PATIENT CALLED AND SAID SHE WAS SUPPOSED TO BRING A COPY OF HER BILL HERE FOR YOU.  WHEN SHE DOES I WILL PUT IT ON YOUR DESK

## 2020-01-23 NOTE — Telephone Encounter (Signed)
I emailed a copy of her bill to the billing department for review.  Myself or the billing department will update the patient after the review the charges.

## 2020-01-28 ENCOUNTER — Telehealth: Payer: Self-pay | Admitting: *Deleted

## 2020-01-28 NOTE — Telephone Encounter (Signed)
LINQ alert received for AF episode on 01/27/20 beginning at 22:33, 6hr 46min duration, V rate overall controlled. Correlates with patient-marked symptom episode on 2/10 at 22:55.   Spoke with patient. She reports that last night she felt palpitations and listened to her heartbeat with her stethoscope and could tell it was irregular. Took PRN diltiazem at 22:10, got up to use the bathroom, felt lightheaded and weak, took another dose at 23:30. Symptoms persisted for most of the night, though she feels better this morning. Back in SR per presenting ECG from 01/28/20. Confirmed compliance with metoprolol tartrate 50mg  BID and Xarelto 20mg  daily.  PRN diltiazem is ordered 4 times daily, but pt reports Dr. Bronson Ing told her she could take another dose an hour after the first if it doesn't help. Pt only has a manual BP cuff, so she did not check her BP during this episode. Advised pt it may be helpful to purchase an automatic cuff. Advised will route to Dr. Rayann Heman for review. Will call back if any new recommendations. Pt in agreement with plan, denies any questions or new concerns at this time.

## 2020-02-02 NOTE — Telephone Encounter (Signed)
Discussed with Dr. Rayann Heman. Plan to adjust Carelink AF alerts for duration >23hrs, V rates >120bpm for 6hrs. Continue to monitor for burden/duration via monthly summary reports. No medication changes at this time.  Spoke with patient to provide update. Explained will monitor burden via summary reports. Pt is in agreement with no changes at this time. Pt agrees to call back if she feels symptoms with AF are lasting longer or happening more frequently. Denies any additional questions or concerns at this time.

## 2020-02-04 ENCOUNTER — Ambulatory Visit: Payer: Medicare Other

## 2020-02-08 DIAGNOSIS — L821 Other seborrheic keratosis: Secondary | ICD-10-CM | POA: Diagnosis not present

## 2020-02-08 DIAGNOSIS — L570 Actinic keratosis: Secondary | ICD-10-CM | POA: Diagnosis not present

## 2020-02-08 DIAGNOSIS — Z85828 Personal history of other malignant neoplasm of skin: Secondary | ICD-10-CM | POA: Diagnosis not present

## 2020-02-10 DIAGNOSIS — Z23 Encounter for immunization: Secondary | ICD-10-CM | POA: Diagnosis not present

## 2020-02-15 ENCOUNTER — Ambulatory Visit (INDEPENDENT_AMBULATORY_CARE_PROVIDER_SITE_OTHER): Payer: Medicare Other | Admitting: *Deleted

## 2020-02-15 DIAGNOSIS — I480 Paroxysmal atrial fibrillation: Secondary | ICD-10-CM | POA: Diagnosis not present

## 2020-02-15 LAB — CUP PACEART REMOTE DEVICE CHECK
Date Time Interrogation Session: 20210228230545
Implantable Pulse Generator Implant Date: 20201012

## 2020-02-15 NOTE — Progress Notes (Signed)
ILR Remote 

## 2020-02-22 DIAGNOSIS — Z961 Presence of intraocular lens: Secondary | ICD-10-CM | POA: Diagnosis not present

## 2020-02-22 DIAGNOSIS — H401131 Primary open-angle glaucoma, bilateral, mild stage: Secondary | ICD-10-CM | POA: Diagnosis not present

## 2020-03-07 ENCOUNTER — Encounter: Payer: Self-pay | Admitting: Cardiovascular Disease

## 2020-03-07 ENCOUNTER — Telehealth (INDEPENDENT_AMBULATORY_CARE_PROVIDER_SITE_OTHER): Payer: Medicare Other | Admitting: Cardiovascular Disease

## 2020-03-07 VITALS — BP 117/76 | Ht 64.0 in | Wt 169.0 lb

## 2020-03-07 DIAGNOSIS — E785 Hyperlipidemia, unspecified: Secondary | ICD-10-CM

## 2020-03-07 DIAGNOSIS — I480 Paroxysmal atrial fibrillation: Secondary | ICD-10-CM

## 2020-03-07 DIAGNOSIS — I10 Essential (primary) hypertension: Secondary | ICD-10-CM

## 2020-03-07 MED ORDER — DILTIAZEM HCL 30 MG PO TABS: 30.0000 mg | ORAL_TABLET | Freq: Four times a day (QID) | ORAL | 6 refills | Status: DC | PRN

## 2020-03-07 NOTE — Patient Instructions (Addendum)
Medication Instructions:   Diltiazem refilled today.   Continue all other medications.    Labwork: none  Testing/Procedures: none  Follow-Up: As needed   Any Other Special Instructions Will Be Listed Below (If Applicable). Continue to follow with Dr. Rayann Heman as you have been.   If you need a refill on your cardiac medications before your next appointment, please call your pharmacy.

## 2020-03-07 NOTE — Addendum Note (Signed)
Addended by: Laurine Blazer on: 03/07/2020 08:52 AM   Modules accepted: Orders

## 2020-03-07 NOTE — Progress Notes (Signed)
Virtual Visit via Telephone Note   This visit type was conducted due to national recommendations for restrictions regarding the COVID-19 Pandemic (e.g. social distancing) in an effort to limit this patient's exposure and mitigate transmission in our community.  Due to her co-morbid illnesses, this patient is at least at moderate risk for complications without adequate follow up.  This format is felt to be most appropriate for this patient at this time.  The patient did not have access to video technology/had technical difficulties with video requiring transitioning to audio format only (telephone).  All issues noted in this document were discussed and addressed.  No physical exam could be performed with this format.  Please refer to the patient's chart for her  consent to telehealth for [facility].   The patient was identified using 2 identifiers.  Date:  03/07/2020   ID:  Heather Cunningham, DOB 1951/04/23, MRN [id number]  Patient Location: Home Provider Location: Office  PCP:  [name], MD  Cardiologist:  Kate Sable, MD  Electrophysiologist:  None   Evaluation Performed:  Follow-Up Visit  Chief Complaint: Paroxysmal atrial fibrillation  History of Present Illness:    Heather Cunningham is a 69 y.o. female with paroxysmal atrial fibrillation.  She also follows with EP.  She has implantable loop recorder.  Implantable loop recorder detected an episode of atrial fibrillation on 01/27/2020 lasting for 6 hours and 22 minutes with a controlled ventricular rate.   Most recent device interrogation demonstrated sinus rhythm with frequent PACs as well.  She denies chest pain, shortness of breath, and leg swelling.  She and her husband got the first doses of the COVID-19 vaccine.    Past Medical History:  Diagnosis Date  . Arthritis    back  . Asthma    reactive airway around cats and dogs  . Bilateral carpal tunnel syndrome   . Cancer (HCC)    Skin  . GERD (gastroesophageal reflux  disease)   . Glaucoma   . Hypertension   . Paroxysmal atrial fibrillation (HCC)   . PONV (postoperative nausea and vomiting)   . Premature ventricular contraction   . Status post placement of implantable loop recorder    Past Surgical History:  Procedure Laterality Date  . BREAST EXCISIONAL BIOPSY Left 1996  . BREAST EXCISIONAL BIOPSY Right 1993  . BREAST LUMPECTOMY    . CATARACT EXTRACTION    . COLONOSCOPY N/A 09/05/2015   Procedure: COLONOSCOPY;  Surgeon: [name], MD;  [facility];  Service: Endoscopy;  Laterality: N/A;  incomplete colonoscopy  . COLONOSCOPY WITH PROPOFOL N/A 11/01/2015   [id number] hemorrhoids/five polyps/mild diverticulosis, tubular adenomas.  Next colonoscopy 3 years with propofol with overtube.  . COLONOSCOPY WITH PROPOFOL N/A 11/02/2019   Procedure: COLONOSCOPY WITH PROPOFOL;  Surgeon: [name], MD;  [facility];  Service: Endoscopy;  Laterality: N/A;  2:00pm with overtube  . EYE SURGERY    . HYSTEROSCOPY WITH D & C N/A 12/31/2016   Procedure: DILATATION AND CURETTAGE /HYSTEROSCOPY, POSSIBLE REMOVAL OF ENDOMETRIAL LESION;  Surgeon: [name], MD;  Location: [facility];  Service: Gynecology;  Laterality: N/A;  . implantable loop recorder placement  09/28/2019   Medtronic Reveal [place] model LNQ22 (SN [id number]) implantable loop recorder implanted by Dr Rayann Heman in office   . Maxiofacial    . POLYPECTOMY  11/01/2015   Procedure: POLYPECTOMY;  Surgeon: [name], MD;  Location: [facility];  Service: Endoscopy;;  . POLYPECTOMY  11/02/2019  Procedure: POLYPECTOMY;  Surgeon: [name], MD;  [facility];  Service: Endoscopy;;  . [place]  . TUBAL LIGATION       Current Meds  Medication Sig  . acetaminophen (TYLENOL) 500 MG tablet Take 500-1,000 mg by mouth every 6 (six) hours as needed for mild pain.   Marland Kitchen albuterol (PROAIR HFA) 108 (90 BASE) MCG/ACT inhaler Inhale 2 puffs into  the lungs every 6 (six) hours as needed for wheezing or shortness of breath.   . calcium carbonate (TUMS - DOSED IN MG ELEMENTAL CALCIUM) 500 MG chewable tablet Chew 1 tablet by mouth every other day. Alternates between tums and omeprazole in the evening before supper  . Calcium Carbonate-Vitamin D (CALTRATE 600+D) 600-400 MG-UNIT per tablet Take 1 tablet by mouth every evening.   . diltiazem (CARDIZEM) 30 MG tablet Take 1 tablet (30 mg total) by mouth daily as needed (Palpitations). (Patient taking differently: Take 30 mg by mouth 4 (four) times daily as needed (episode of a-fib). )  . docusate sodium (COLACE) 100 MG capsule Take 100 mg by mouth 2 (two) times daily.   . dorzolamide (TRUSOPT) 2 % ophthalmic solution Place 1 drop into both eyes 2 (two) times daily.   Marland Kitchen latanoprost (XALATAN) 0.005 % ophthalmic solution Place 1 drop into both eyes at bedtime.  Marland Kitchen lisinopril-hydrochlorothiazide (PRINZIDE,ZESTORETIC) 20-25 MG tablet Take 1 tablet by mouth at bedtime.   . meclizine (ANTIVERT) 25 MG tablet Take 25 mg by mouth 3 (three) times daily as needed for dizziness.  . Melatonin 3 MG CAPS Take 3 mg by mouth as needed (sleep). At bedtime for sleep  . metoprolol tartrate (LOPRESSOR) 50 MG tablet TAKE 1 TABLET BY MOUTH TWICE DAILY (Patient taking differently: Take 50 mg by mouth 2 (two) times daily. )  . omeprazole (PRILOSEC) 40 MG capsule Take 40 mg by mouth every other day. In the evening before supper  . simvastatin (ZOCOR) 20 MG tablet Take 20 mg by mouth daily at 6 PM.   . XARELTO 20 MG TABS tablet TAKE 1 TABLET BY MOUTH EVERY DAY WITH SUPPER     Allergies:   Other   Social History   Tobacco Use  . Smoking status: Former Smoker    Packs/day: 1.00    Years: 10.00    Pack years: 10.00    Types: Cigarettes    Start date: 12/17/1966    Quit date: 12/17/1976    Years since quitting: 43.2  . Smokeless tobacco: Never Used  . Tobacco comment: QUIT 35 YEARS AGO  Substance Use Topics  . Alcohol  use: No    Alcohol/week: 0.0 standard drinks  . Drug use: No     Family Hx: The patient's family history includes Cancer in an other family member; Colon cancer in her maternal grandmother and mother; Heart failure in an other family member. There is no history of Colon polyps.  ROS:   Please see the history of present illness.     All other systems reviewed and are negative.   Prior CV studies:   The following studies were reviewed today:   Nuclear stress test on 02/13/2019 was normal.  Labs/Other Tests and Data Reviewed:    EKG:  No ECG reviewed.  Recent Labs: 10/29/2019: BUN 17; Creatinine, Ser 0.50; Potassium 3.5; Sodium 138   Recent Lipid Panel No results found for: CHOL, TRIG, HDL, CHOLHDL, LDLCALC, LDLDIRECT  Wt Readings from Last 3 Encounters:  03/07/20  169 lb (76.7 kg)  11/02/19 170 lb (77.1 kg)  09/28/19 170 lb (77.1 kg)     Objective:    Vital Signs:  BP 117/76   Ht 5\' 4"  (1.626 m)   Wt 169 lb (76.7 kg)   BMI 29.01 kg/m    VITAL SIGNS:  reviewed  ASSESSMENT & PLAN:    1.  Paroxysmal atrial fibrillation: Currently on Lopressor 50 mg twice daily and she takes diltiazem 30 mg as needed for palpitations (I will refill diltiazem today).  She has an implantable loop recorder with most recent interrogation reviewed above.  She is on Xarelto for anticoagulation.  She follows with EP.  2.  Hypertension: Blood pressure is normal.  No change to therapy.  3.  Hyperlipidemia: She remains on simvastatin 20 mg.    COVID-19 Education: The signs and symptoms of COVID-19 were discussed with the patient and how to seek care for testing (follow up with PCP or arrange E-visit).  The importance of social distancing was discussed today.  Time:   Today, I have spent 15 minutes with the patient with telehealth technology discussing the above problems.     Medication Adjustments/Labs and Tests Ordered: Current medicines are reviewed at length with the patient today.   Concerns regarding medicines are outlined above.   Tests Ordered: No orders of the defined types were placed in this encounter.   Medication Changes: No orders of the defined types were placed in this encounter.   Follow Up:  Virtual Visit  prn  Signed, Kate Sable, MD  03/07/2020 8:29 AM    [facility]

## 2020-03-09 DIAGNOSIS — Z23 Encounter for immunization: Secondary | ICD-10-CM | POA: Diagnosis not present

## 2020-03-10 ENCOUNTER — Ambulatory Visit: Payer: Medicare Other

## 2020-03-17 ENCOUNTER — Ambulatory Visit (INDEPENDENT_AMBULATORY_CARE_PROVIDER_SITE_OTHER): Payer: Medicare Other | Admitting: *Deleted

## 2020-03-17 DIAGNOSIS — I480 Paroxysmal atrial fibrillation: Secondary | ICD-10-CM | POA: Diagnosis not present

## 2020-03-17 LAB — CUP PACEART REMOTE DEVICE CHECK
Date Time Interrogation Session: 20210331230500
Implantable Pulse Generator Implant Date: 20201012

## 2020-03-17 NOTE — Progress Notes (Signed)
ILR Remote 

## 2020-04-07 ENCOUNTER — Telehealth: Payer: Self-pay

## 2020-04-07 NOTE — Telephone Encounter (Signed)
Patient notified of Dr Jackalyn Lombard recommendation and changes to programming and will contact office if she has any issues or symptoms.

## 2020-04-07 NOTE — Telephone Encounter (Signed)
Spoke with patient and notified that she was in AF this morning . Patient was symptomatic and felt her heart racing, felt weak, no CP, no sOB, no dizziness or syncope. Patient took cardizem 30 mg po and then took another dose of Cardizem 30 mg po 1 hour later. Linq II recordings of symptom recorded episodes show SR with PACs . She reports she has been having episodes of weakness at night and her manual pulse is 40 bpm at the time.  Dr Rayann Heman made aware of the above report., will turn on brady , pause and PVC alerts on LINQ II.

## 2020-04-07 NOTE — Telephone Encounter (Signed)
LMOM at home #. Called patient cell #. Patient unavailable to take call per husband. Patient will call office when she returns home. Patient called earlier inquiring if her transmission showed she is in AF. Current EGM on transmission @ 1309 showed SR.Patient had AF episode at 0454 that lasted 4 hrs and 54 minutes with ave v-rate of 130s. + OAC.

## 2020-04-07 NOTE — Telephone Encounter (Signed)
Pt called in wanting to let us know that she thought she was in afib this morning and she sent a transmission and wants to know if someone can give her a call. This happened this morning. Please call her back on the [phone]

## 2020-04-18 ENCOUNTER — Ambulatory Visit (INDEPENDENT_AMBULATORY_CARE_PROVIDER_SITE_OTHER): Payer: Medicare Other | Admitting: *Deleted

## 2020-04-18 DIAGNOSIS — I480 Paroxysmal atrial fibrillation: Secondary | ICD-10-CM

## 2020-04-18 LAB — CUP PACEART REMOTE DEVICE CHECK
Date Time Interrogation Session: 20210501230644
Implantable Pulse Generator Implant Date: 20201012

## 2020-04-19 NOTE — Progress Notes (Signed)
Carelink Summary Report / Loop Recorder 

## 2020-04-29 ENCOUNTER — Ambulatory Visit: Payer: Medicare Other

## 2020-05-19 ENCOUNTER — Ambulatory Visit (INDEPENDENT_AMBULATORY_CARE_PROVIDER_SITE_OTHER): Payer: Medicare Other | Admitting: *Deleted

## 2020-05-19 ENCOUNTER — Telehealth: Payer: Self-pay | Admitting: Emergency Medicine

## 2020-05-19 DIAGNOSIS — I480 Paroxysmal atrial fibrillation: Secondary | ICD-10-CM

## 2020-05-19 LAB — CUP PACEART REMOTE DEVICE CHECK
Date Time Interrogation Session: 20210602230645
Implantable Pulse Generator Implant Date: 20201012

## 2020-05-19 NOTE — Telephone Encounter (Signed)
Pt left a message returning [place], rn phone call. Her phone number is [phone].

## 2020-05-19 NOTE — Telephone Encounter (Signed)
Patient reports she felt her heart "skipping" during the transmissions  she sent. Reports no CP, SOB, or syncope. No missed doses of metoprolol, she reports being under a lot of stress caring for her husband with Stage IV cancer. ED precautions given for CP, SOB, syncope, dizziness .

## 2020-05-19 NOTE — Telephone Encounter (Signed)
LMOM to call device clinic. Alert for symptomatic episode on 5/27/& 04/27/20. Assess symptoms.

## 2020-05-24 NOTE — Progress Notes (Signed)
Carelink Summary Report / Loop Recorder 

## 2020-06-21 ENCOUNTER — Ambulatory Visit (INDEPENDENT_AMBULATORY_CARE_PROVIDER_SITE_OTHER): Payer: Medicare Other | Admitting: *Deleted

## 2020-06-21 DIAGNOSIS — R002 Palpitations: Secondary | ICD-10-CM

## 2020-06-21 LAB — CUP PACEART REMOTE DEVICE CHECK
Date Time Interrogation Session: 20210705230150
Implantable Pulse Generator Implant Date: 20201012

## 2020-06-22 NOTE — Progress Notes (Signed)
Carelink Summary Report / Loop Recorder 

## 2020-07-03 NOTE — Progress Notes (Signed)
Cardiology Office Note Date:  07/04/2020  Patient ID:  Heather Cunningham, Heather Cunningham [birth year], MRN [id number] PCP:  [name], MD  Cardiologist:  Dr. Nicanor Alcon EP: Dr. Rayann Heman    Chief Complaint:  65mo EP visit  History of Present Illness: Heather Cunningham is a 69 y.o. female with history of HTN, PVCs, Afib  In review of EP notes, Dr. Rayann Heman mentions: "I saw her previously in 2016 (my note reviewed).  At that time, her afib was controlled but she had frequent palpitations due to PVCs and PACs.  Conservative measures were advised.  She has recently worn  An event monitor in June (reviewed) which showed no arrhythmias. She feels that she does have episodes of afib.  She reports having about 4 hours of afib 08/15/2019.  She is on xarelto for stroke prevention.  She reports "I think I have more of those extra beats than I do of afib"."   >>> she underwent loop implant Oct 2020  She comes in today to be seen for Dr. Rayann Heman, last seen by him 12/28/19 via tele health visit.  She was doing well without symptoms.  She had not had any AF, noted PACs PVCs only.  He mentioned consideration in the future of stopping a/c of no further AF was noted.  More recently had a tele health visit with Dr. Nicanor Alcon, 03/07/20, she was doing well, no changes were made.  She is doing "OK", though her AFib burden ids increased of late.  She notes they are happening at night, has been about 1x per month for the last couple months now. She has started to take a 30mg  diltiazem every HS  She denies known sleep apnea or snoring, doe snot think she has apnea, no ETOH. She does have increased personal stress, her husband diagnosed with stage IV lung cancer  Earlier in the year and has been started on chemo.  No bleeding or signs of bleeding, reports compliance with her Xarelto   Device information MDT linq II, implanted 09/28/2019, afib surveillence   Past Medical History:  Diagnosis Date  . Arthritis    back  . Asthma     reactive airway around cats and dogs  . Bilateral carpal tunnel syndrome   . Cancer (HCC)    Skin  . GERD (gastroesophageal reflux disease)   . Glaucoma   . Hypertension   . Paroxysmal atrial fibrillation (HCC)   . PONV (postoperative nausea and vomiting)   . Premature ventricular contraction   . Status post placement of implantable loop recorder     Past Surgical History:  Procedure Laterality Date  . BREAST EXCISIONAL BIOPSY Left 1996  . BREAST EXCISIONAL BIOPSY Right 1993  . BREAST LUMPECTOMY    . CATARACT EXTRACTION    . COLONOSCOPY N/A 09/05/2015   Procedure: COLONOSCOPY;  Surgeon: [name], MD;  [facility];  Service: Endoscopy;  Laterality: N/A;  incomplete colonoscopy  . COLONOSCOPY WITH PROPOFOL N/A 11/01/2015   BOF:BPZWCHEN hemorrhoids/five polyps/mild diverticulosis, tubular adenomas.  Next colonoscopy 3 years with propofol with overtube.  . COLONOSCOPY WITH PROPOFOL N/A 11/02/2019   Procedure: COLONOSCOPY WITH PROPOFOL;  Surgeon: [name], MD;  [facility];  Service: Endoscopy;  Laterality: N/A;  2:00pm with overtube  . EYE SURGERY    . HYSTEROSCOPY WITH D & C N/A 12/31/2016   Procedure: DILATATION AND CURETTAGE /HYSTEROSCOPY, POSSIBLE REMOVAL OF ENDOMETRIAL LESION;  Surgeon: [name], MD;  Location: [facility];  Service:  Gynecology;  Laterality: N/A;  . implantable loop recorder placement  09/28/2019   Medtronic Reveal [place] model LNQ22 (SN [id number]) implantable loop recorder implanted by Dr Rayann Heman in office   . Maxiofacial    . POLYPECTOMY  11/01/2015   Procedure: POLYPECTOMY;  Surgeon: [name], MD;  Location: [facility];  Service: Endoscopy;;  . POLYPECTOMY  11/02/2019   Procedure: POLYPECTOMY;  Surgeon: [name], MD;  [facility];  Service: Endoscopy;;  . [place]  . TUBAL LIGATION      Current Outpatient Medications  Medication Sig Dispense Refill  . acetaminophen  (TYLENOL) 500 MG tablet Take 500-1,000 mg by mouth every 6 (six) hours as needed for mild pain.     Marland Kitchen albuterol (PROAIR HFA) 108 (90 BASE) MCG/ACT inhaler Inhale 2 puffs into the lungs every 6 (six) hours as needed for wheezing or shortness of breath.     . calcium carbonate (TUMS - DOSED IN MG ELEMENTAL CALCIUM) 500 MG chewable tablet Chew 1 tablet by mouth every other day. Alternates between tums and omeprazole in the evening before supper    . Calcium Carbonate-Vitamin D (CALTRATE 600+D) 600-400 MG-UNIT per tablet Take 1 tablet by mouth every evening.     . diltiazem (CARDIZEM) 30 MG tablet Take 1 tablet (30 mg total) by mouth 4 (four) times daily as needed (episode of a-fib). 30 tablet 6  . docusate sodium (COLACE) 100 MG capsule Take 100 mg by mouth 2 (two) times daily.     . dorzolamide (TRUSOPT) 2 % ophthalmic solution Place 1 drop into both eyes 2 (two) times daily.     Marland Kitchen latanoprost (XALATAN) 0.005 % ophthalmic solution Place 1 drop into both eyes at bedtime.    Marland Kitchen lisinopril-hydrochlorothiazide (PRINZIDE,ZESTORETIC) 20-25 MG tablet Take 1 tablet by mouth at bedtime.     . meclizine (ANTIVERT) 25 MG tablet Take 25 mg by mouth 3 (three) times daily as needed for dizziness.    . Melatonin 3 MG CAPS Take 3 mg by mouth as needed (sleep). At bedtime for sleep    . metoprolol tartrate (LOPRESSOR) 50 MG tablet TAKE 1 TABLET BY MOUTH TWICE DAILY (Patient taking differently: Take 50 mg by mouth 2 (two) times daily. ) 180 tablet 3  . omeprazole (PRILOSEC) 40 MG capsule Take 40 mg by mouth every other day. In the evening before supper    . simvastatin (ZOCOR) 20 MG tablet Take 20 mg by mouth daily at 6 PM.     . XARELTO 20 MG TABS tablet TAKE 1 TABLET BY MOUTH EVERY DAY WITH SUPPER 30 tablet 6   No current facility-administered medications for this visit.    Allergies:   Other   Social History:  The patient  reports that she quit smoking about 43 years ago. Her smoking use included cigarettes. She  started smoking about 53 years ago. She has a 10.00 pack-year smoking history. She has never used smokeless tobacco. She reports that she does not drink alcohol and does not use drugs.   Family History:  The patient's family history includes Cancer in an other family member; Colon cancer in her maternal grandmother and mother; Heart failure in an other family member.  ROS:  Please see the history of present illness.  All other systems are reviewed and otherwise negative.   PHYSICAL EXAM:  VS:  BP 124/72   Pulse 61   Ht 5\' 5"  (1.651 m)   Wt  170 lb (77.1 kg)   BMI 28.29 kg/m  BMI: Body mass index is 28.29 kg/m. Well nourished, well developed, in no acute distress  HEENT: normocephalic, atraumatic  Neck: no JVD, carotid bruits or masses Cardiac: RRR; no significant murmurs, no rubs, or gallops Lungs:  CTA b/l, no wheezing, rhonchi or rales  Abd: soft, nontender MS: no deformity or atrophy Ext: no edema  Skin: warm and dry, no rash Neuro:  No gross deficits appreciated Psych: euthymic mood, full affect   ILR site is stable, no tethering or discomfort   EKG:  Done today and reviewed by myself: SR 61bpm  ILR interrogation done today and reviewed by myself:  SR today, battery is good AF burden is 0.5% She had AFib 07/02/20  Other episodes had been previously seen   11/30/2015: TTE Study Conclusions  - Left ventricle: The cavity size was normal. There was mild  concentric hypertrophy. Systolic function was normal. The  estimated ejection fraction was in the range of 60% to 65%. Wall  motion was normal; there were no regional wall motion  abnormalities. Left ventricular diastolic function parameters  were normal.  - Aortic valve: Trileaflet; normal thickness leaflets. There was no  regurgitation.  - Aortic root: The aortic root was normal in size.  - Ascending aorta: The ascending aorta was normal in size.  - Mitral valve: Structurally normal valve. There was no    regurgitation.  - Left atrium: The atrium was normal in size.  - Right ventricle: The cavity size was normal. Wall thickness was  normal. Systolic function was normal.  - Right atrium: The atrium was normal in size.  - Tricuspid valve: There was trivial regurgitation.  - Pulmonic valve: There was no regurgitation.  - Pulmonary arteries: Systolic pressure was within the normal  range.  - Inferior vena cava: The vessel was normal in size.  - Pericardium, extracardiac: There was no pericardial effusion.   Impressions:   - Normal study.    11/30/2015: stress myoview  Nuclear stress EF: 62%.  There was no ST segment deviation noted during stress.  The study is normal.  This is a low risk study.  The left ventricular ejection fraction is normal (55-65%).   No ischemia. LV function is normal with EF 62% and no wall motion abnormalities. This is similar to stress echo of 2009 with EF 60-65%    Recent Labs: 10/29/2019: BUN 17; Creatinine, Ser 0.50; Potassium 3.5; Sodium 138  No results found for requested labs within last 8760 hours.   CrCl cannot be calculated (Patient's most recent lab result is older than the maximum 21 days allowed.).   Wt Readings from Last 3 Encounters:  07/04/20 170 lb (77.1 kg)  03/07/20 169 lb (76.7 kg)  11/02/19 170 lb (77.1 kg)     Other studies reviewed: Additional studies/records reviewed today include: summarized above  ASSESSMENT AND PLAN:  1. Paroxysmal AFib    CHA2DS2Vasc is 3, on Xarelto,  appropriately dosed    Over all low burden    Labs today  Stress/worry of her husband's cancer diagnosis perhaps contributing.  We discussed taking a daily dose of a long acting dilt, though for now, she is comfortable using the 30mg  at HS. I will have her see/get establishedwith the AFib clinic  2. HTN     Looks OK, no changes    Could tolerate a daily dose of dilt if needed   Disposition: F/u with the AFib clinic in 3-4 weeks,  sooner if needed   Current medicines are reviewed at length with the patient today.  The patient did not have any concerns regarding medicines.  Venetia Night, PA-C 07/04/2020 4:29 PM     [address] [place] [address] [phone] (office)  [phone] (fax)

## 2020-07-04 ENCOUNTER — Ambulatory Visit (INDEPENDENT_AMBULATORY_CARE_PROVIDER_SITE_OTHER): Payer: Medicare Other | Admitting: Physician Assistant

## 2020-07-04 ENCOUNTER — Other Ambulatory Visit: Payer: Self-pay

## 2020-07-04 VITALS — BP 124/72 | HR 61 | Ht 65.0 in | Wt 170.0 lb

## 2020-07-04 DIAGNOSIS — Z79899 Other long term (current) drug therapy: Secondary | ICD-10-CM | POA: Diagnosis not present

## 2020-07-04 DIAGNOSIS — I480 Paroxysmal atrial fibrillation: Secondary | ICD-10-CM

## 2020-07-04 DIAGNOSIS — I10 Essential (primary) hypertension: Secondary | ICD-10-CM | POA: Diagnosis not present

## 2020-07-04 LAB — CBC
Hematocrit: 40.2 % (ref 34.0–46.6)
Hemoglobin: 13.1 g/dL (ref 11.1–15.9)
MCH: 29.7 pg (ref 26.6–33.0)
MCHC: 32.6 g/dL (ref 31.5–35.7)
MCV: 91 fL (ref 79–97)
Platelets: 271 10*3/uL (ref 150–450)
RBC: 4.41 x10E6/uL (ref 3.77–5.28)
RDW: 13.1 % (ref 11.7–15.4)
WBC: 9 10*3/uL (ref 3.4–10.8)

## 2020-07-04 LAB — BASIC METABOLIC PANEL
BUN/Creatinine Ratio: 23 (ref 12–28)
BUN: 13 mg/dL (ref 8–27)
CO2: 24 mmol/L (ref 20–29)
Calcium: 9.3 mg/dL (ref 8.7–10.3)
Chloride: 101 mmol/L (ref 96–106)
Creatinine, Ser: 0.56 mg/dL — ABNORMAL LOW (ref 0.57–1.00)
GFR calc Af Amer: 108 mL/min/{1.73_m2} (ref >59)
GFR calc non Af Amer: 94 mL/min/{1.73_m2} (ref >59)
Glucose: 102 mg/dL — ABNORMAL HIGH (ref 65–99)
Potassium: 3.8 mmol/L (ref 3.5–5.2)
Sodium: 139 mmol/L (ref 134–144)

## 2020-07-04 LAB — MAGNESIUM: Magnesium: 1.9 mg/dL (ref 1.6–2.3)

## 2020-07-04 NOTE — Patient Instructions (Signed)
Medication Instructions:  Your physician recommends that you continue on your current medications as directed. Please refer to the Current Medication list given to you today.  *If you need a refill on your cardiac medications before your next appointment, please call your pharmacy*   Lab Work: BM[facility] AND [place]   If you have labs (blood work) drawn today and your tests are completely normal, you will receive your results only by: Marland Kitchen MyChart Message (if you have MyChart) OR . A paper copy in the mail If you have any lab test that is abnormal or we need to change your treatment, we will call you to review the results.   Testing/Procedures: NONE ORDERED  TODAY   Follow-Up: At [facility], you and your health needs are our priority.  As part of our continuing mission to provide you with exceptional heart care, we have created designated Provider Care Teams.  These Care Teams include your primary Cardiologist (physician) and Advanced Practice Providers (APPs -  Physician Assistants and Nurse Practitioners) who all work together to provide you with the care you need, when you need it.  We recommend signing up for the patient portal called "MyChart".  Sign up information is provided on this After Visit Summary.  MyChart is used to connect with patients for Virtual Visits (Telemedicine).  Patients are able to view lab/test results, encounter notes, upcoming appointments, etc.  Non-urgent messages can be sent to your provider as well.   To learn more about what you can do with MyChart, go to [email or web address].    Your next appointment:  AS SCHEDULED WITH AFIB CLINIC   Other Instructions

## 2020-07-12 ENCOUNTER — Telehealth: Payer: Self-pay

## 2020-07-12 NOTE — Telephone Encounter (Signed)
Pt reports she activated symptom marker last night due to symptoms that were different from what she normally feels during AF.  Around 10pm last night she felt like her heart rate was irregular, she listened with a stethescope and felt like her heart was fluttering.  She reports to have felt very uneasy with what she heard and wanted to make sure we are aware of it.    Pt confirmed she is taking Metoprolol 50mg  BID.  Pt has orders for Diltiazem 30mg  four times daily as needed for palpitations.  She reports she is taking that every night before bed and did take it last night during this episode.    Rhythm appears to be SR with ectopy. ptdoes have history of PACs and PVCs, current PVC burden is 0.1%.    Assured pt, rhythm does not appear concerning advised would forward to MD for review.  Anticipate no changes, if any changes we will contact her.

## 2020-07-13 ENCOUNTER — Other Ambulatory Visit: Payer: Self-pay | Admitting: Cardiology

## 2020-07-18 NOTE — Telephone Encounter (Signed)
LINQ alert received for AF episode and correlating symptom episode on 07/17/20. V rates elevated at times. Per nightly transmission from 07/18/20, AF episode was 6hrs in duration. Presenting rhythm from 8/2 shows SR. Pt reports compliance with Xarelto, metoprolol, and PRN diltiazem. Pt states AF episode made her feel weak and nervous. She could feel her heartbeat in her throat. She is interested in switching to long-acting diltiazem as discussed at OV with Jens Som, PA-C, on 07/04/20. Message sent to R. Fenton, [place], as Juluis Rainier as pt has upcoming f/u on 07/25/20.

## 2020-07-21 ENCOUNTER — Ambulatory Visit (INDEPENDENT_AMBULATORY_CARE_PROVIDER_SITE_OTHER): Payer: Medicare Other | Admitting: *Deleted

## 2020-07-21 DIAGNOSIS — R002 Palpitations: Secondary | ICD-10-CM | POA: Diagnosis not present

## 2020-07-25 ENCOUNTER — Encounter (HOSPITAL_COMMUNITY): Payer: Self-pay | Admitting: Physician Assistant

## 2020-07-25 ENCOUNTER — Ambulatory Visit (HOSPITAL_COMMUNITY)
Admission: RE | Admit: 2020-07-25 | Discharge: 2020-07-25 | Disposition: A | Discharge status: Home / Self Care | Payer: Medicare Other | Source: Ambulatory Visit | Attending: Physician Assistant | Admitting: Physician Assistant

## 2020-07-25 ENCOUNTER — Other Ambulatory Visit: Payer: Self-pay

## 2020-07-25 VITALS — BP 138/76 | HR 64 | Ht 64.0 in | Wt 173.0 lb

## 2020-07-25 DIAGNOSIS — E785 Hyperlipidemia, unspecified: Secondary | ICD-10-CM | POA: Insufficient documentation

## 2020-07-25 DIAGNOSIS — Z85828 Personal history of other malignant neoplasm of skin: Secondary | ICD-10-CM | POA: Diagnosis not present

## 2020-07-25 DIAGNOSIS — Z8719 Personal history of other diseases of the digestive system: Secondary | ICD-10-CM | POA: Diagnosis not present

## 2020-07-25 DIAGNOSIS — J45909 Unspecified asthma, uncomplicated: Secondary | ICD-10-CM | POA: Insufficient documentation

## 2020-07-25 DIAGNOSIS — M479 Spondylosis, unspecified: Secondary | ICD-10-CM | POA: Diagnosis not present

## 2020-07-25 DIAGNOSIS — H409 Unspecified glaucoma: Secondary | ICD-10-CM | POA: Insufficient documentation

## 2020-07-25 DIAGNOSIS — K219 Gastro-esophageal reflux disease without esophagitis: Secondary | ICD-10-CM | POA: Insufficient documentation

## 2020-07-25 DIAGNOSIS — D6869 Other thrombophilia: Secondary | ICD-10-CM | POA: Diagnosis not present

## 2020-07-25 DIAGNOSIS — I480 Paroxysmal atrial fibrillation: Secondary | ICD-10-CM | POA: Diagnosis not present

## 2020-07-25 DIAGNOSIS — Z79899 Other long term (current) drug therapy: Secondary | ICD-10-CM | POA: Diagnosis not present

## 2020-07-25 DIAGNOSIS — Z9849 Cataract extraction status, unspecified eye: Secondary | ICD-10-CM | POA: Insufficient documentation

## 2020-07-25 DIAGNOSIS — Z86010 Personal history of colonic polyps: Secondary | ICD-10-CM | POA: Diagnosis not present

## 2020-07-25 DIAGNOSIS — G5603 Carpal tunnel syndrome, bilateral upper limbs: Secondary | ICD-10-CM | POA: Insufficient documentation

## 2020-07-25 DIAGNOSIS — Z87891 Personal history of nicotine dependence: Secondary | ICD-10-CM | POA: Insufficient documentation

## 2020-07-25 DIAGNOSIS — Z7901 Long term (current) use of anticoagulants: Secondary | ICD-10-CM | POA: Diagnosis not present

## 2020-07-25 DIAGNOSIS — I10 Essential (primary) hypertension: Secondary | ICD-10-CM | POA: Insufficient documentation

## 2020-07-25 DIAGNOSIS — I493 Ventricular premature depolarization: Secondary | ICD-10-CM | POA: Diagnosis not present

## 2020-07-25 LAB — CUP PACEART REMOTE DEVICE CHECK
Date Time Interrogation Session: 20210807230408
Implantable Pulse Generator Implant Date: 20201012

## 2020-07-25 MED ORDER — SIMVASTATIN 10 MG PO TABS: 10.0000 mg | ORAL_TABLET | Freq: Every day | ORAL | 3 refills | Status: DC

## 2020-07-25 MED ORDER — DILTIAZEM HCL ER COATED BEADS 120 MG PO CP24: ORAL_CAPSULE | ORAL | 1 refills | Status: DC

## 2020-07-25 NOTE — Patient Instructions (Signed)
Start Diltiazem 120mg  at bedtime Decrease Simvastatin medication to 10mg  once daily Sent in both medications to your pharmacy

## 2020-07-25 NOTE — Progress Notes (Signed)
Carelink Summary Report / Loop Recorder 

## 2020-07-25 NOTE — Progress Notes (Signed)
Primary Care Physician: [name], MD Primary Electrophysiologist: Dr Rayann Heman Referring Physician: Device clinic/Renee Charlcie Cradle PA   Heather Cunningham is a 69 y.o. female with a history of paroxysmal atrial fibrillation and HTN who presents for follow up in the [facility].  The patient was initially diagnosed with atrial fibrillation remotely and has been maintained on PRN diltiazem. Patient is on Xarelto for a CHADS2VASC score of 3. She underwent ILR implant on 09/2019 for afib management. She reports that she has had more breakthrough episodes of afib with symptoms of generalized weakness. She states her husband was diagnosed with stage IV lung cancer and she has been under significant stress.   Today, she denies symptoms of chest pain, shortness of breath, orthopnea, PND, lower extremity edema, dizziness, presyncope, syncope, snoring, daytime somnolence, bleeding, or neurologic sequela. The patient is tolerating medications without difficulties and is otherwise without complaint today.    Atrial Fibrillation Risk Factors:  she does not have symptoms or diagnosis of sleep apnea. she does not have a history of rheumatic fever. she does not have a history of alcohol use. The patient does not have a history of early familial atrial fibrillation or other arrhythmias.  she has a BMI of Body mass index is 29.7 kg/m.Marland Kitchen Filed Weights   07/25/20 1353  Weight: 78.5 kg    Family History  Problem Relation Age of Onset  . Cancer Other   . Heart failure Other   . Colon cancer Mother   . Colon cancer Maternal Grandmother   . Colon polyps Neg Hx      Atrial Fibrillation Management history:  Previous antiarrhythmic drugs: none Previous cardioversions: none Previous ablations: none CHADS2VASC score: 3 Anticoagulation history: Xarelto   Past Medical History:  Diagnosis Date  . Arthritis    back  . Asthma    reactive airway around cats and dogs  . Bilateral  carpal tunnel syndrome   . Cancer (HCC)    Skin  . GERD (gastroesophageal reflux disease)   . Glaucoma   . Hypertension   . Paroxysmal atrial fibrillation (HCC)   . PONV (postoperative nausea and vomiting)   . Premature ventricular contraction   . Status post placement of implantable loop recorder    Past Surgical History:  Procedure Laterality Date  . BREAST EXCISIONAL BIOPSY Left 1996  . BREAST EXCISIONAL BIOPSY Right 1993  . BREAST LUMPECTOMY    . CATARACT EXTRACTION    . COLONOSCOPY N/A 09/05/2015   Procedure: COLONOSCOPY;  Surgeon: [name], MD;  [facility];  Service: Endoscopy;  Laterality: N/A;  incomplete colonoscopy  . COLONOSCOPY WITH PROPOFOL N/A 11/01/2015   [id number] hemorrhoids/five polyps/mild diverticulosis, tubular adenomas.  Next colonoscopy 3 years with propofol with overtube.  . COLONOSCOPY WITH PROPOFOL N/A 11/02/2019   Procedure: COLONOSCOPY WITH PROPOFOL;  Surgeon: [name], MD;  [facility];  Service: Endoscopy;  Laterality: N/A;  2:00pm with overtube  . EYE SURGERY    . HYSTEROSCOPY WITH D & C N/A 12/31/2016   Procedure: DILATATION AND CURETTAGE /HYSTEROSCOPY, POSSIBLE REMOVAL OF ENDOMETRIAL LESION;  Surgeon: [name], MD;  Location: [facility];  Service: Gynecology;  Laterality: N/A;  . implantable loop recorder placement  09/28/2019   Medtronic Reveal [place] model [id number] (SN [id number]) implantable loop recorder implanted by Dr Rayann Heman in office   . Maxiofacial    . POLYPECTOMY  11/01/2015   Procedure: POLYPECTOMY;  Surgeon: [name], MD;  Location: [facility];  Service: Endoscopy;;  . POLYPECTOMY  11/02/2019   Procedure: POLYPECTOMY;  Surgeon: [name], MD;  [facility];  Service: Endoscopy;;  . [place]  . TUBAL LIGATION      Current Outpatient Medications  Medication Sig Dispense Refill  . acetaminophen (TYLENOL) 500 MG tablet Take 500-1,000 mg by mouth every 6  (six) hours as needed for mild pain.     Marland Kitchen albuterol (PROAIR HFA) 108 (90 BASE) MCG/ACT inhaler Inhale 2 puffs into the lungs every 6 (six) hours as needed for wheezing or shortness of breath.     . calcium carbonate (TUMS - DOSED IN MG ELEMENTAL CALCIUM) 500 MG chewable tablet Chew 1 tablet by mouth every other day. Alternates between tums and omeprazole in the evening before supper    . Calcium Carbonate-Vitamin D (CALTRATE 600+D) 600-400 MG-UNIT per tablet Take 1 tablet by mouth every evening.     . diltiazem (CARDIZEM) 30 MG tablet Take 1 tablet (30 mg total) by mouth 4 (four) times daily as needed (episode of a-fib). 30 tablet 6  . docusate sodium (COLACE) 100 MG capsule Take 100 mg by mouth 2 (two) times daily.     . dorzolamide (TRUSOPT) 2 % ophthalmic solution Place 1 drop into both eyes 2 (two) times daily.     Marland Kitchen latanoprost (XALATAN) 0.005 % ophthalmic solution Place 1 drop into both eyes at bedtime.    Marland Kitchen lisinopril-hydrochlorothiazide (PRINZIDE,ZESTORETIC) 20-25 MG tablet Take 1 tablet by mouth at bedtime.     . meclizine (ANTIVERT) 25 MG tablet Take 25 mg by mouth 3 (three) times daily as needed for dizziness.    . Melatonin 3 MG CAPS Take 3 mg by mouth as needed (sleep). At bedtime for sleep    . metoprolol tartrate (LOPRESSOR) 50 MG tablet Take 1 tablet (50 mg total) by mouth 2 (two) times daily. 180 tablet 1  . omeprazole (PRILOSEC) 40 MG capsule Take 40 mg by mouth every other day. In the evening before supper    . simvastatin (ZOCOR) 20 MG tablet Take 20 mg by mouth daily at 6 PM.     . XARELTO 20 MG TABS tablet TAKE 1 TABLET BY MOUTH EVERY DAY WITH SUPPER 30 tablet 6   No current facility-administered medications for this encounter.    Allergies  Allergen Reactions  . Other     CANNOT RECEIVE AN MRI DUE TO METAL BAND (RIGHT EYE) DUE TO DETATCHED RETINA     Social History   Socioeconomic History  . Marital status: Married    Spouse name: [name]  . Number of children: 1   . Years of education: Not on file  . Highest education level: Not on file  Occupational History  . Occupation: [employer]    Employer: OTHER  Tobacco Use  . Smoking status: Former Smoker    Packs/day: 1.00    Years: 10.00    Pack years: 10.00    Types: Cigarettes    Start date: 12/17/1966    Quit date: 12/17/1976    Years since quitting: 43.6  . Smokeless tobacco: Never Used  . Tobacco comment: QUIT 35 YEARS AGO  Vaping Use  . Vaping Use: Never used  Substance and Sexual Activity  . Alcohol use: No    Alcohol/week: 0.0 standard drinks  . Drug use: No  . Sexual activity: Not on file  Other Topics Concern  . Not on  file  Social History Narrative   Pt gets regular exercise. 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 KIDS: 1 [place], [place] WAS IN THE [employer].   Social Determinants of Health   Financial Resource Strain:   . Difficulty of Paying Living Expenses:   Food Insecurity:   . Worried About Charity fundraiser in the Last Year:   . Arboriculturist in the Last Year:   Transportation Needs:   . Film/video editor (Medical):   Marland Kitchen Lack of Transportation (Non-Medical):   Physical Activity:   . Days of Exercise per Week:   . Minutes of Exercise per Session:   Stress:   . Feeling of Stress :   Social Connections:   . Frequency of Communication with Friends and Family:   . Frequency of Social Gatherings with Friends and Family:   . Attends Religious Services:   . Active Member of Clubs or Organizations:   . Attends Archivist Meetings:   Marland Kitchen Marital Status:   Intimate Partner Violence:   . Fear of Current or Ex-Partner:   . Emotionally Abused:   Marland Kitchen Physically Abused:   . Sexually Abused:      ROS- All systems are reviewed and negative except as per the HPI above.  Physical Exam: Vitals:   07/25/20 1353  BP: 138/76  Pulse: 64  SpO2: 97%  Weight: 78.5 kg  Height: 5\' 4"  (1.626 m)     GEN- The patient is well appearing, alert and oriented x 3 today.   Head- normocephalic, atraumatic Eyes-  Sclera clear, conjunctiva pink Ears- hearing intact Oropharynx- clear Neck- supple  Lungs- Clear to ausculation bilaterally, normal work of breathing Heart- Regular rate and rhythm, no murmurs, rubs or gallops  GI- soft, NT, ND, + BS Extremities- no clubbing, cyanosis, or edema MS- no significant deformity or atrophy Skin- no rash or lesion Psych- euthymic mood, full affect Neuro- strength and sensation are intact  Wt Readings from Last 3 Encounters:  07/25/20 78.5 kg  07/04/20 77.1 kg  03/07/20 76.7 kg    EKG today demonstrates SR HR 65, PR 186, QRS 80, QTc 436  Echo 11/30/15 demonstrated  - Left ventricle: The cavity size was normal. There was mild  concentric hypertrophy. Systolic function was normal. The  estimated ejection fraction was in the range of 60% to 65%. Wall  motion was normal; there were no regional wall motion  abnormalities. Left ventricular diastolic function parameters  were normal.  - Aortic valve: Trileaflet; normal thickness leaflets. There was no  regurgitation.  - Aortic root: The aortic root was normal in size.  - Ascending aorta: The ascending aorta was normal in size.  - Mitral valve: Structurally normal valve. There was no  regurgitation.  - Left atrium: The atrium was normal in size.  - Right ventricle: The cavity size was normal. Wall thickness was  normal. Systolic function was normal.  - Right atrium: The atrium was normal in size.  - Tricuspid valve: There was trivial regurgitation.  - Pulmonic valve: There was no regurgitation.  - Pulmonary arteries: Systolic pressure was within the normal  range.  - Inferior vena cava: The vessel was normal in size.  - Pericardium, extracardiac: There was no pericardial effusion.   Impressions:   - Normal study.   Epic records are reviewed at length  today  CHA2DS2-VASc Score = 3  The patient's score is based upon: CHF History:  0 HTN History: 1 Age : 1 Diabetes History: 0 Stroke History: 0 Vascular Disease History: 0 Gender: 1      ASSESSMENT AND PLAN: 1. Paroxysmal Atrial Fibrillation (ICD10:  I48.0) The patient's CHA2DS2-VASc score is 3, indicating a 3.2% annual risk of stroke. We discussed therapeutic options today including increasing diltiazem, AAD, and ablation.  Will start diltiazem 120 mg daily for now. Continue 30 mg PRN q 4 hours. Continue Xarelto 20 mg daily Continue Lopressor 50 mg BID  2. Secondary Hypercoagulable State (ICD10:  D68.69) The patient is at significant risk for stroke/thromboembolism based upon her CHA2DS2-VASc Score of 3.  Continue Rivaroxaban (Xarelto).   3. HTN Stable, med changes as above.   4. HLD Will decrease simvastatin dose to  10 mg daily to accommodate scheduled diltiazem.   Follow up in the AF clinic in one month.    [facility] [address] [place], [place] [address] [phone] 07/25/2020 2:19 PM

## 2020-07-27 ENCOUNTER — Telehealth: Payer: Self-pay

## 2020-07-27 NOTE — Telephone Encounter (Signed)
The pt calling about the carelink summary report shows. She wants to know what Af w/rvr. She would like to talk with the nurse. Her number is  [phone]. I told her I will have the nurse give her a call back.

## 2020-07-27 NOTE — Telephone Encounter (Signed)
Reviewed transmission from 07/23/20 with patient. Questions answered. Patient was seen in AF Clinic omn 07/25/20 and started ob diltiazem CD.

## 2020-08-23 ENCOUNTER — Other Ambulatory Visit: Payer: Self-pay

## 2020-08-23 ENCOUNTER — Ambulatory Visit (INDEPENDENT_AMBULATORY_CARE_PROVIDER_SITE_OTHER): Payer: Medicare Other | Admitting: *Deleted

## 2020-08-23 ENCOUNTER — Ambulatory Visit (HOSPITAL_COMMUNITY)
Admission: RE | Admit: 2020-08-23 | Discharge: 2020-08-23 | Disposition: A | Discharge status: Home / Self Care | Payer: Medicare Other | Source: Ambulatory Visit | Attending: Physician Assistant | Admitting: Physician Assistant

## 2020-08-23 ENCOUNTER — Encounter (HOSPITAL_COMMUNITY): Payer: Self-pay | Admitting: Physician Assistant

## 2020-08-23 VITALS — BP 130/80 | HR 61 | Ht 64.0 in | Wt 167.4 lb

## 2020-08-23 DIAGNOSIS — Z79899 Other long term (current) drug therapy: Secondary | ICD-10-CM | POA: Insufficient documentation

## 2020-08-23 DIAGNOSIS — D6869 Other thrombophilia: Secondary | ICD-10-CM | POA: Insufficient documentation

## 2020-08-23 DIAGNOSIS — I493 Ventricular premature depolarization: Secondary | ICD-10-CM | POA: Diagnosis not present

## 2020-08-23 DIAGNOSIS — K219 Gastro-esophageal reflux disease without esophagitis: Secondary | ICD-10-CM | POA: Insufficient documentation

## 2020-08-23 DIAGNOSIS — Z86010 Personal history of colonic polyps: Secondary | ICD-10-CM | POA: Diagnosis not present

## 2020-08-23 DIAGNOSIS — Z87891 Personal history of nicotine dependence: Secondary | ICD-10-CM | POA: Insufficient documentation

## 2020-08-23 DIAGNOSIS — Z7901 Long term (current) use of anticoagulants: Secondary | ICD-10-CM | POA: Insufficient documentation

## 2020-08-23 DIAGNOSIS — Z85828 Personal history of other malignant neoplasm of skin: Secondary | ICD-10-CM | POA: Insufficient documentation

## 2020-08-23 DIAGNOSIS — R002 Palpitations: Secondary | ICD-10-CM

## 2020-08-23 DIAGNOSIS — H409 Unspecified glaucoma: Secondary | ICD-10-CM | POA: Insufficient documentation

## 2020-08-23 DIAGNOSIS — J45909 Unspecified asthma, uncomplicated: Secondary | ICD-10-CM | POA: Diagnosis not present

## 2020-08-23 DIAGNOSIS — G5603 Carpal tunnel syndrome, bilateral upper limbs: Secondary | ICD-10-CM | POA: Diagnosis not present

## 2020-08-23 DIAGNOSIS — I480 Paroxysmal atrial fibrillation: Secondary | ICD-10-CM | POA: Diagnosis not present

## 2020-08-23 DIAGNOSIS — M1990 Unspecified osteoarthritis, unspecified site: Secondary | ICD-10-CM | POA: Diagnosis not present

## 2020-08-23 DIAGNOSIS — I10 Essential (primary) hypertension: Secondary | ICD-10-CM | POA: Insufficient documentation

## 2020-08-23 DIAGNOSIS — Z8719 Personal history of other diseases of the digestive system: Secondary | ICD-10-CM | POA: Diagnosis not present

## 2020-08-23 LAB — COMPREHENSIVE METABOLIC PANEL
ALT: 23 U/L (ref 0–44)
AST: 29 U/L (ref 15–41)
Albumin: 3.9 g/dL (ref 3.5–5.0)
Alkaline Phosphatase: 56 U/L (ref 38–126)
Anion gap: 9 (ref 5–15)
BUN: 18 mg/dL (ref 8–23)
CO2: 25 mmol/L (ref 22–32)
Calcium: 9.2 mg/dL (ref 8.9–10.3)
Chloride: 101 mmol/L (ref 98–111)
Creatinine, Ser: 0.67 mg/dL (ref 0.44–1.00)
GFR calc Af Amer: >60 mL/min (ref >60)
GFR calc non Af Amer: >60 mL/min (ref >60)
Glucose, Bld: 105 mg/dL — ABNORMAL HIGH (ref 70–99)
Potassium: 3.6 mmol/L (ref 3.5–5.1)
Sodium: 135 mmol/L (ref 135–145)
Total Bilirubin: 0.4 mg/dL (ref 0.3–1.2)
Total Protein: 7.5 g/dL (ref 6.5–8.1)

## 2020-08-23 MED ORDER — MULTAQ 400 MG PO TABS: 400.0000 mg | ORAL_TABLET | Freq: Two times a day (BID) | ORAL | 3 refills | Status: DC

## 2020-08-23 NOTE — Patient Instructions (Signed)
Start Multaq 400mg twice a day WITH FOOD 

## 2020-08-23 NOTE — Progress Notes (Signed)
Primary Care Physician: [name], MD Primary Electrophysiologist: Dr Rayann Heman Referring Physician: Device clinic/Renee Charlcie Cradle PA   [name] is a 69 y.o. female with a history of paroxysmal atrial fibrillation and HTN who presents for follow up in the [facility].  The patient was initially diagnosed with atrial fibrillation remotely and has been maintained on PRN diltiazem. Patient is on Xarelto for a CHADS2VASC score of 3. She underwent ILR implant on 09/2019 for afib management. She reports that she has had more breakthrough episodes of afib with symptoms of generalized weakness. She states her husband was diagnosed with stage IV lung cancer and she has been under significant stress.   On follow up today, patient reports she has done reasonably well since her last visit. She has had a few episodes of afib, the longest was ~ 3 hours. She denies any bleeding issues on anticoagulation.   Today, she denies symptoms of chest pain, shortness of breath, orthopnea, PND, lower extremity edema, dizziness, presyncope, syncope, snoring, daytime somnolence, bleeding, or neurologic sequela. The patient is tolerating medications without difficulties and is otherwise without complaint today.    Atrial Fibrillation Risk Factors:  she does not have symptoms or diagnosis of sleep apnea. she does not have a history of rheumatic fever. she does not have a history of alcohol use. The patient does not have a history of early familial atrial fibrillation or other arrhythmias.  she has a BMI of Body mass index is 28.73 kg/m.Marland Kitchen Filed Weights   08/23/20 1041  Weight: 75.9 kg    Family History  Problem Relation Age of Onset   Cancer Other    Heart failure Other    Colon cancer Mother    Colon cancer Maternal Grandmother    Colon polyps Neg Hx      Atrial Fibrillation Management history:  Previous antiarrhythmic drugs: none Previous cardioversions: none Previous  ablations: none CHADS2VASC score: 3 Anticoagulation history: Xarelto   Past Medical History:  Diagnosis Date   Arthritis    back   Asthma    reactive airway around cats and dogs   Bilateral carpal tunnel syndrome    Cancer ([place])    Skin   GERD (gastroesophageal reflux disease)    Glaucoma    Hypertension    Paroxysmal atrial fibrillation (HCC)    PONV (postoperative nausea and vomiting)    Premature ventricular contraction    Status post placement of implantable loop recorder    Past Surgical History:  Procedure Laterality Date   BREAST EXCISIONAL BIOPSY Left 1996   BREAST EXCISIONAL BIOPSY Right 1993   BREAST LUMPECTOMY     CATARACT EXTRACTION     COLONOSCOPY N/A 09/05/2015   Procedure: COLONOSCOPY;  Surgeon: [name], MD;  [facility];  Service: Endoscopy;  Laterality: N/A;  incomplete colonoscopy   COLONOSCOPY WITH PROPOFOL N/A 11/01/2015   [id number] hemorrhoids/five polyps/mild diverticulosis, tubular adenomas.  Next colonoscopy 3 years with propofol with overtube.   COLONOSCOPY WITH PROPOFOL N/A 11/02/2019   Procedure: COLONOSCOPY WITH PROPOFOL;  Surgeon: [name], MD;  [facility];  Service: Endoscopy;  Laterality: N/A;  2:00pm with overtube   EYE SURGERY     HYSTEROSCOPY WITH D & C N/A 12/31/2016   Procedure: DILATATION AND CURETTAGE /HYSTEROSCOPY, POSSIBLE REMOVAL OF ENDOMETRIAL LESION;  Surgeon: [name], MD;  Location: [facility];  Service: Gynecology;  Laterality: N/A;   implantable loop recorder placement  09/28/2019  Medtronic Reveal [place] model [id number] ([place] [id number]) implantable loop recorder implanted by Dr Rayann Heman in office    Maxiofacial     POLYPECTOMY  11/01/2015   Procedure: POLYPECTOMY;  Surgeon: [name], MD;  Location: [facility];  Service: Endoscopy;;   POLYPECTOMY  11/02/2019   Procedure: POLYPECTOMY;  Surgeon: [name], MD;  [facility];  Service: Endoscopy;;    RETINAL DETACHMENT SURGERY  1997  1999   TUBAL LIGATION      Current Outpatient Medications  Medication Sig Dispense Refill   acetaminophen (TYLENOL) 500 MG tablet Take 500-1,000 mg by mouth every 6 (six) hours as needed for mild pain.      albuterol (PROAIR HFA) 108 (90 BASE) MCG/ACT inhaler Inhale 2 puffs into the lungs every 6 (six) hours as needed for wheezing or shortness of breath.      calcium carbonate (TUMS - DOSED IN MG ELEMENTAL CALCIUM) 500 MG chewable tablet Chew 1 tablet by mouth every other day. Alternates between tums and omeprazole in the evening before supper     Calcium Carbonate-Vitamin D (CALTRATE 600+D) 600-400 MG-UNIT per tablet Take 1 tablet by mouth every evening.      diltiazem (CARDIZEM CD) 120 MG 24 hr capsule Take one capsule by mouth bedtime 30 capsule 1   diltiazem (CARDIZEM) 30 MG tablet Take 1 tablet (30 mg total) by mouth 4 (four) times daily as needed (episode of a-fib). 30 tablet 6   docusate sodium (COLACE) 100 MG capsule Take 100 mg by mouth 2 (two) times daily.      dorzolamide (TRUSOPT) 2 % ophthalmic solution Place 1 drop into both eyes 2 (two) times daily.      latanoprost (XALATAN) 0.005 % ophthalmic solution Place 1 drop into both eyes at bedtime.     lisinopril-hydrochlorothiazide (PRINZIDE,ZESTORETIC) 20-25 MG tablet Take 1 tablet by mouth at bedtime.      meclizine (ANTIVERT) 25 MG tablet Take 25 mg by mouth 3 (three) times daily as needed for dizziness.     Melatonin 3 MG CAPS Take 3 mg by mouth as needed (sleep). At bedtime for sleep     metoprolol tartrate (LOPRESSOR) 50 MG tablet Take 1 tablet (50 mg total) by mouth 2 (two) times daily. 180 tablet 1   omeprazole (PRILOSEC) 40 MG capsule Take 40 mg by mouth every other day. In the evening before supper     simvastatin (ZOCOR) 10 MG tablet Take 1 tablet (10 mg total) by mouth daily. 30 tablet 3   XARELTO 20 MG TABS tablet TAKE 1 TABLET BY MOUTH EVERY DAY WITH SUPPER 30 tablet 6    dronedarone (MULTAQ) 400 MG tablet Take 1 tablet (400 mg total) by mouth 2 (two) times daily with a meal. 60 tablet 3   No current facility-administered medications for this encounter.    Allergies  Allergen Reactions   Other     CANNOT RECEIVE AN MRI DUE TO METAL BAND (RIGHT EYE) DUE TO DETATCHED RETINA     Social History   Socioeconomic History   Marital status: Married    Spouse name: [name]   Number of children: 1   Years of education: Not on file   Highest education level: Not on file  Occupational History   Occupation: [employer]    Employer: OTHER  Tobacco Use   Smoking status: Former Smoker    Packs/day: 1.00    Years: 10.00    Pack years: 10.00  Types: Cigarettes    Start date: 12/17/1966    Quit date: 12/17/1976    Years since quitting: 43.7   Smokeless tobacco: Never Used   Tobacco comment: QUIT 67 YEARS AGO  Vaping Use   Vaping Use: Never used  Substance and Sexual Activity   Alcohol use: No    Alcohol/week: 0.0 standard drinks   Drug use: No   Sexual activity: Not on file  Other Topics Concern   Not on file  Social History Narrative   Pt gets regular exercise. 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 KIDS: [place], [place] WAS IN THE [employer].   Social Determinants of Health   Financial Resource Strain:    Difficulty of Paying Living Expenses: Not on file  Food Insecurity:    Worried About Charity fundraiser in the Last Year: Not on file   [employer] of Food in the Last Year: Not on file  Transportation Needs:    Lack of Transportation (Medical): Not on file   Lack of Transportation (Non-Medical): Not on file  Physical Activity:    Days of Exercise per Week: Not on file   Minutes of Exercise per Session: Not on file  Stress:    Feeling of Stress : Not on file  Social Connections:    Frequency of Communication with Friends and Family: Not on file    Frequency of Social Gatherings with Friends and Family: Not on file   Attends Religious Services: Not on file   Active Member of Clubs or Organizations: Not on file   Attends Archivist Meetings: Not on file   Marital Status: Not on file  Intimate Partner Violence:    Fear of Current or Ex-Partner: Not on file   Emotionally Abused: Not on file   Physically Abused: Not on file   Sexually Abused: Not on file     ROS- All systems are reviewed and negative except as per the HPI above.  Physical Exam: Vitals:   08/23/20 1041  BP: 130/80  Pulse: 61  Weight: 75.9 kg  Height: 5\' 4"  (1.626 m)    GEN- The patient is well appearing, alert and oriented x 3 today.   HEENT-head normocephalic, atraumatic, sclera clear, conjunctiva pink, hearing intact, trachea midline. Lungs- Clear to ausculation bilaterally, normal work of breathing Heart- Regular rate and rhythm, no murmurs, rubs or gallops  GI- soft, NT, ND, + BS Extremities- no clubbing, cyanosis, or edema MS- no significant deformity or atrophy Skin- no rash or lesion Psych- euthymic mood, full affect Neuro- strength and sensation are intact   Wt Readings from Last 3 Encounters:  08/23/20 75.9 kg  07/25/20 78.5 kg  07/04/20 77.1 kg    EKG today demonstrates SR HR 61, PR 194, QRS 80, QTc 436  Echo 11/30/15 demonstrated  - Left ventricle: The cavity size was normal. There was mild  concentric hypertrophy. Systolic function was normal. The  estimated ejection fraction was in the range of 60% to 65%. Wall  motion was normal; there were no regional wall motion  abnormalities. Left ventricular diastolic function parameters  were normal.  - Aortic valve: Trileaflet; normal thickness leaflets. There was no  regurgitation.  - Aortic root: The aortic root was normal in size.  - Ascending aorta: The ascending aorta was normal in size.  - Mitral valve: Structurally normal valve. There was no   regurgitation.  - Left atrium: The atrium was normal  in size.  - Right ventricle: The cavity size was normal. Wall thickness was  normal. Systolic function was normal.  - Right atrium: The atrium was normal in size.  - Tricuspid valve: There was trivial regurgitation.  - Pulmonic valve: There was no regurgitation.  - Pulmonary arteries: Systolic pressure was within the normal  range.  - Inferior vena cava: The vessel was normal in size.  - Pericardium, extracardiac: There was no pericardial effusion.   Impressions:   - Normal study.   Epic records are reviewed at length today  CHA2DS2-VASc Score = 3  The patient's score is based upon: CHF History: 0 HTN History: 1 Age : 1 Diabetes History: 0 Stroke History: 0 Vascular Disease History: 0 Gender: 1      ASSESSMENT AND PLAN: 1. Paroxysmal Atrial Fibrillation (ICD10:  I48.0) The patient's CHA2DS2-VASc score is 3, indicating a 3.2% annual risk of stroke. ILR shows AF burden 1.7% We discussed therapeutic options including AAD and ablation. Will plan to start Multaq 400 mg BID. Check Cmet today. If patient doesn't tolerate Multaq, she is interested in ablation.  Continue diltiazem 120 mg daily. Continue 30 mg PRN q 4 hours. Continue Xarelto 20 mg daily Continue Lopressor 50 mg BID  2. Secondary Hypercoagulable State (ICD10:  D68.69) The patient is at significant risk for stroke/thromboembolism based upon her CHA2DS2-VASc Score of 3.  Continue Rivaroxaban (Xarelto).   3. HTN Stable, no changes today.   Follow up in the AF clinic next week for ECG.    [facility] [address] [place], [place] [address] [phone] 08/23/2020 11:18 AM

## 2020-08-25 ENCOUNTER — Telehealth (HOSPITAL_COMMUNITY): Payer: Self-pay | Admitting: *Deleted

## 2020-08-25 NOTE — Telephone Encounter (Signed)
Pt called in stating since starting Multaq her heart rates have been around 55-58 feeling tired. Discussed with Adline Peals PA will stop cardizem 120mg  and call with update tomorrow. Pt in agreement.

## 2020-08-26 LAB — CUP PACEART REMOTE DEVICE CHECK
Date Time Interrogation Session: 20210909230236
Implantable Pulse Generator Implant Date: 20201012

## 2020-08-26 NOTE — Progress Notes (Signed)
Carelink Summary Report / Loop Recorder 

## 2020-08-31 ENCOUNTER — Ambulatory Visit (HOSPITAL_COMMUNITY)
Admission: RE | Admit: 2020-08-31 | Discharge: 2020-08-31 | Disposition: A | Discharge status: Home / Self Care | Payer: Medicare Other | Source: Ambulatory Visit | Attending: Physician Assistant | Admitting: Physician Assistant

## 2020-08-31 ENCOUNTER — Other Ambulatory Visit: Payer: Self-pay

## 2020-08-31 VITALS — BP 134/70 | HR 58

## 2020-08-31 DIAGNOSIS — I440 Atrioventricular block, first degree: Secondary | ICD-10-CM | POA: Insufficient documentation

## 2020-08-31 DIAGNOSIS — Z79899 Other long term (current) drug therapy: Secondary | ICD-10-CM | POA: Insufficient documentation

## 2020-08-31 DIAGNOSIS — I480 Paroxysmal atrial fibrillation: Secondary | ICD-10-CM

## 2020-08-31 DIAGNOSIS — I4891 Unspecified atrial fibrillation: Secondary | ICD-10-CM | POA: Diagnosis not present

## 2020-08-31 NOTE — Progress Notes (Signed)
Patient returns for ECG after starting Multaq. ECG shows SB HR 58, 1st degree AV block PR 216, QRS 80, QTc 447. Patient does report some "queasy" feelings after her morning dose of Multaq despite taking it with food although this is not very bothersome. ILR shows no interim episodes of afib. She is curious about ablation and would like appt with EP to discuss. Follow up with Dr Rayann Heman.

## 2020-09-01 ENCOUNTER — Ambulatory Visit (HOSPITAL_COMMUNITY): Payer: Medicare Other | Admitting: Physician Assistant

## 2020-09-06 ENCOUNTER — Other Ambulatory Visit (HOSPITAL_COMMUNITY): Payer: Self-pay | Admitting: Physician Assistant

## 2020-09-15 ENCOUNTER — Other Ambulatory Visit: Payer: Self-pay | Admitting: Cardiology

## 2020-09-19 DIAGNOSIS — K219 Gastro-esophageal reflux disease without esophagitis: Secondary | ICD-10-CM | POA: Diagnosis not present

## 2020-09-19 DIAGNOSIS — I10 Essential (primary) hypertension: Secondary | ICD-10-CM | POA: Diagnosis not present

## 2020-09-19 DIAGNOSIS — R739 Hyperglycemia, unspecified: Secondary | ICD-10-CM | POA: Diagnosis not present

## 2020-09-19 DIAGNOSIS — E782 Mixed hyperlipidemia: Secondary | ICD-10-CM | POA: Diagnosis not present

## 2020-09-22 ENCOUNTER — Ambulatory Visit: Payer: Medicare Other

## 2020-09-28 LAB — CUP PACEART REMOTE DEVICE CHECK
Date Time Interrogation Session: 20211012230115
Implantable Pulse Generator Implant Date: 20201012

## 2020-09-30 ENCOUNTER — Ambulatory Visit (INDEPENDENT_AMBULATORY_CARE_PROVIDER_SITE_OTHER): Payer: Medicare Other | Admitting: Internal Medicine

## 2020-09-30 ENCOUNTER — Other Ambulatory Visit: Payer: Self-pay

## 2020-09-30 ENCOUNTER — Encounter: Payer: Self-pay | Admitting: Internal Medicine

## 2020-09-30 VITALS — BP 128/76 | HR 60 | Ht 64.0 in | Wt 169.0 lb

## 2020-09-30 DIAGNOSIS — I480 Paroxysmal atrial fibrillation: Secondary | ICD-10-CM | POA: Diagnosis not present

## 2020-09-30 DIAGNOSIS — I10 Essential (primary) hypertension: Secondary | ICD-10-CM

## 2020-09-30 DIAGNOSIS — R002 Palpitations: Secondary | ICD-10-CM

## 2020-09-30 NOTE — Patient Instructions (Addendum)
Medication Instructions:  Your physician recommends that you continue on your current medications as directed. Please refer to the Current Medication list given to you today.  *If you need a refill on your cardiac medications before your next appointment, please call your pharmacy*  Lab Work: None ordered.  If you have labs (blood work) drawn today and your tests are completely normal, you will receive your results only by: Marland Kitchen MyChart Message (if you have MyChart) OR . A paper copy in the mail If you have any lab test that is abnormal or we need to change your treatment, we will call you to review the results.  Testing/Procedures: None ordered.  Follow-Up: At [facility], you and your health needs are our priority.  As part of our continuing mission to provide you with exceptional heart care, we have created designated Provider Care Teams.  These Care Teams include your primary Cardiologist (physician) and Advanced Practice Providers (APPs -  Physician Assistants and Nurse Practitioners) who all work together to provide you with the care you need, when you need it.  We recommend signing up for the patient portal called "MyChart".  Sign up information is provided on this After Visit Summary.  MyChart is used to connect with patients for Virtual Visits (Telemedicine).  Patients are able to view lab/test results, encounter notes, upcoming appointments, etc.  Non-urgent messages can be sent to your provider as well.   To learn more about what you can do with MyChart, go to [email or web address].    Your next appointment:   Your physician wants you to follow-up in: 3 months with Ricky at the [facility], they will contact you to schedule. Call Dr. Jackalyn Lombard nurse Otila Kluver when you are ready to schedule your ablation.    Other Instructions:

## 2020-09-30 NOTE — Progress Notes (Signed)
PCP: [name], MD   Primary EP: Dr Loma Messing is a 69 y.o. female who presents today for routine electrophysiology followup.  Since last being seen in our clinic, the patient reports doing very well.  She is having afib episodes.  these typically last up to 3 hours. She has improved afib with multaq but has had significant nausea with this medicine.  Today, she denies symptoms of palpitations, chest pain, shortness of breath,  lower extremity edema, dizziness, presyncope, or syncope.  The patient is otherwise without complaint today.   Past Medical History:  Diagnosis Date  . Arthritis    back  . Asthma    reactive airway around cats and dogs  . Bilateral carpal tunnel syndrome   . Cancer (HCC)    Skin  . GERD (gastroesophageal reflux disease)   . Glaucoma   . Hypertension   . Paroxysmal atrial fibrillation (HCC)   . PONV (postoperative nausea and vomiting)   . Premature ventricular contraction   . Status post placement of implantable loop recorder    Past Surgical History:  Procedure Laterality Date  . BREAST EXCISIONAL BIOPSY Left 1996  . BREAST EXCISIONAL BIOPSY Right 1993  . BREAST LUMPECTOMY    . CATARACT EXTRACTION    . COLONOSCOPY N/A 09/05/2015   Procedure: COLONOSCOPY;  Surgeon: [name], MD;  [facility];  Service: Endoscopy;  Laterality: N/A;  incomplete colonoscopy  . COLONOSCOPY WITH PROPOFOL N/A 11/01/2015   [id number] hemorrhoids/five polyps/mild diverticulosis, tubular adenomas.  Next colonoscopy 3 years with propofol with overtube.  . COLONOSCOPY WITH PROPOFOL N/A 11/02/2019   Procedure: COLONOSCOPY WITH PROPOFOL;  Surgeon: [name], MD;  [facility];  Service: Endoscopy;  Laterality: N/A;  2:00pm with overtube  . EYE SURGERY    . HYSTEROSCOPY WITH D & C N/A 12/31/2016   Procedure: DILATATION AND CURETTAGE /HYSTEROSCOPY, POSSIBLE REMOVAL OF ENDOMETRIAL LESION;  Surgeon: [name], MD;  Location: [facility];   Service: Gynecology;  Laterality: N/A;  . implantable loop recorder placement  09/28/2019   Medtronic Reveal [place] model [id number] (SN [id number]) implantable loop recorder implanted by Dr Rayann Heman in office   . Maxiofacial    . POLYPECTOMY  11/01/2015   Procedure: POLYPECTOMY;  Surgeon: [name], MD;  Location: [facility];  Service: Endoscopy;;  . POLYPECTOMY  11/02/2019   Procedure: POLYPECTOMY;  Surgeon: [name], MD;  [facility];  Service: Endoscopy;;  . [place]  . TUBAL LIGATION      ROS- all systems are reviewed and negatives except as per HPI above  Current Outpatient Medications  Medication Sig Dispense Refill  . acetaminophen (TYLENOL) 500 MG tablet Take 500-1,000 mg by mouth every 6 (six) hours as needed for mild pain.     Marland Kitchen albuterol (PROAIR HFA) 108 (90 BASE) MCG/ACT inhaler Inhale 2 puffs into the lungs every 6 (six) hours as needed for wheezing or shortness of breath.     . calcium carbonate (TUMS - DOSED IN MG ELEMENTAL CALCIUM) 500 MG chewable tablet Chew 1 tablet by mouth every other day. Alternates between tums and omeprazole in the evening before supper    . Calcium Carbonate-Vitamin D (CALTRATE 600+D) 600-400 MG-UNIT per tablet Take 1 tablet by mouth every evening.     . diltiazem (CARDIZEM) 30 MG tablet Take 1 tablet (30 mg total) by mouth 4 (four) times daily as needed (episode of  a-fib). 30 tablet 6  . docusate sodium (COLACE) 100 MG capsule Take 100 mg by mouth 2 (two) times daily.     . dorzolamide (TRUSOPT) 2 % ophthalmic solution Place 1 drop into both eyes 2 (two) times daily.     Marland Kitchen dronedarone (MULTAQ) 400 MG tablet Take 1 tablet (400 mg total) by mouth 2 (two) times daily with a meal. 60 tablet 3  . latanoprost (XALATAN) 0.005 % ophthalmic solution Place 1 drop into both eyes at bedtime.    Marland Kitchen lisinopril-hydrochlorothiazide (PRINZIDE,ZESTORETIC) 20-25 MG tablet Take 1 tablet by mouth at bedtime.     . meclizine  (ANTIVERT) 25 MG tablet Take 25 mg by mouth 3 (three) times daily as needed for dizziness.    . Melatonin 3 MG CAPS Take 3 mg by mouth as needed (sleep). At bedtime for sleep    . metoprolol tartrate (LOPRESSOR) 50 MG tablet Take 1 tablet (50 mg total) by mouth 2 (two) times daily. 180 tablet 1  . omeprazole (PRILOSEC) 40 MG capsule Take 40 mg by mouth every other day. In the evening before supper    . simvastatin (ZOCOR) 10 MG tablet Take 1 tablet (10 mg total) by mouth daily. 30 tablet 3  . XARELTO 20 MG TABS tablet TAKE 1 TABLET BY MOUTH EVERY DAY WITH SUPPER 30 tablet 1   No current facility-administered medications for this visit.    Physical Exam: Vitals:   09/30/20 1242  BP: 128/76  Pulse: 60  SpO2: 93%  Weight: 169 lb (76.7 kg)  Height: 5\' 4"  (1.626 m)    GEN- The patient is well appearing, alert and oriented x 3 today.   Head- normocephalic, atraumatic Eyes-  Sclera clear, conjunctiva pink Ears- hearing intact Oropharynx- clear Lungs- Clear to ausculation bilaterally, normal work of breathing Heart- Regular rate and rhythm, no murmurs, rubs or gallops, PMI not laterally displaced GI- soft, NT, ND, + BS Extremities- no clubbing, cyanosis, or edema  Wt Readings from Last 3 Encounters:  09/30/20 169 lb (76.7 kg)  08/23/20 167 lb 6.4 oz (75.9 kg)  07/25/20 173 lb (78.5 kg)    EKG tracing ordered today is personally reviewed and shows sinus rhythm 60 bpm, PR 200 msec, QRS 80 msec, QTc 460 msec  Assessment and Plan:  1. Paroxysmal atrial fibrillation afib burden by ILR is 0.8% She is on xarelto for chads2vasc score of 3. she has failed medical therapy with multaq.   Therapeutic strategies for afib including medicine and ablation were discussed in detail with the patient today. Risk, benefits, and alternatives to EP study and radiofrequency ablation for afib were also discussed in detail today. These risks include but are not limited to stroke, bleeding, vascular  damage, tamponade, perforation, damage to the esophagus, lungs, and other structures, pulmonary vein stenosis, worsening renal function, and death. The patient understands these risk and wishes to think about this further.  Her husband is quite ill and she is his primary care giver.  She will contact our office if she decides to proceed.  If we schedule ablation, we will plan to use Carto, ICE, anesthesia for the procedure.  Will also obtain cardiac CT prior to the procedure to exclude LAA thrombus and further evaluate atrial anatomy.  2. HTN Stable No change required today  If she does not proceed with ablation, we will plan to have her follow-up in the AF clinic in 3 months  Thompson Grayer MD, [facility] 09/30/2020 12:55 PM

## 2020-10-14 DIAGNOSIS — Z1331 Encounter for screening for depression: Secondary | ICD-10-CM | POA: Diagnosis not present

## 2020-10-14 DIAGNOSIS — Z23 Encounter for immunization: Secondary | ICD-10-CM | POA: Diagnosis not present

## 2020-10-14 DIAGNOSIS — Z1389 Encounter for screening for other disorder: Secondary | ICD-10-CM | POA: Diagnosis not present

## 2020-10-14 DIAGNOSIS — E782 Mixed hyperlipidemia: Secondary | ICD-10-CM | POA: Diagnosis not present

## 2020-10-14 DIAGNOSIS — I10 Essential (primary) hypertension: Secondary | ICD-10-CM | POA: Diagnosis not present

## 2020-10-14 DIAGNOSIS — I480 Paroxysmal atrial fibrillation: Secondary | ICD-10-CM | POA: Diagnosis not present

## 2020-10-14 DIAGNOSIS — Z6829 Body mass index (BMI) 29.0-29.9, adult: Secondary | ICD-10-CM | POA: Diagnosis not present

## 2020-10-14 DIAGNOSIS — M810 Age-related osteoporosis without current pathological fracture: Secondary | ICD-10-CM | POA: Diagnosis not present

## 2020-10-24 ENCOUNTER — Ambulatory Visit (INDEPENDENT_AMBULATORY_CARE_PROVIDER_SITE_OTHER): Payer: Medicare Other

## 2020-10-24 DIAGNOSIS — R002 Palpitations: Secondary | ICD-10-CM

## 2020-10-24 LAB — CUP PACEART REMOTE DEVICE CHECK
Date Time Interrogation Session: 20211107230330
Implantable Pulse Generator Implant Date: 20201012

## 2020-10-25 NOTE — Progress Notes (Signed)
Carelink Summary Report / Loop Recorder 

## 2020-11-03 ENCOUNTER — Other Ambulatory Visit: Payer: Self-pay | Admitting: Internal Medicine

## 2020-11-03 NOTE — Telephone Encounter (Signed)
Xarelto refill was sent on 09/15/2020 for 30 days with 1 refill by [facility] Clinic.  Xarelto 20mg  refill request received. Pt is 69 years old, weight-76.7kg, Crea-0.67 on 08/23/2020, last seen by Dr. Rayann Heman on 09/30/2020, Diagnosis-Afib, CrCl-93.10ml/min; Dose is appropriate based on dosing criteria. Will send in refill to requested pharmacy.

## 2020-11-16 DIAGNOSIS — Z23 Encounter for immunization: Secondary | ICD-10-CM | POA: Diagnosis not present

## 2020-11-24 ENCOUNTER — Ambulatory Visit (INDEPENDENT_AMBULATORY_CARE_PROVIDER_SITE_OTHER): Payer: Medicare Other

## 2020-11-24 DIAGNOSIS — R002 Palpitations: Secondary | ICD-10-CM

## 2020-11-26 LAB — CUP PACEART REMOTE DEVICE CHECK
Date Time Interrogation Session: 20211210230127
Implantable Pulse Generator Implant Date: 20201012

## 2020-12-07 NOTE — Progress Notes (Signed)
Carelink Summary Report / Loop Recorder 

## 2020-12-21 ENCOUNTER — Ambulatory Visit
Admission: RE | Admit: 2020-12-21 | Discharge: 2020-12-21 | Disposition: A | Discharge status: Home / Self Care | Payer: Medicare Other | Source: Ambulatory Visit | Attending: Family Medicine | Admitting: Family Medicine

## 2020-12-21 ENCOUNTER — Other Ambulatory Visit: Payer: Self-pay

## 2020-12-21 DIAGNOSIS — Z1231 Encounter for screening mammogram for malignant neoplasm of breast: Secondary | ICD-10-CM | POA: Diagnosis not present

## 2020-12-28 DIAGNOSIS — Z961 Presence of intraocular lens: Secondary | ICD-10-CM | POA: Diagnosis not present

## 2020-12-28 DIAGNOSIS — H401131 Primary open-angle glaucoma, bilateral, mild stage: Secondary | ICD-10-CM | POA: Diagnosis not present

## 2020-12-29 ENCOUNTER — Ambulatory Visit (INDEPENDENT_AMBULATORY_CARE_PROVIDER_SITE_OTHER): Payer: Medicare Other

## 2020-12-29 DIAGNOSIS — R002 Palpitations: Secondary | ICD-10-CM | POA: Diagnosis not present

## 2020-12-29 LAB — CUP PACEART REMOTE DEVICE CHECK
Date Time Interrogation Session: 20220112230446
Implantable Pulse Generator Implant Date: 20201012

## 2021-01-03 ENCOUNTER — Other Ambulatory Visit: Payer: Self-pay

## 2021-01-03 ENCOUNTER — Encounter (HOSPITAL_COMMUNITY): Payer: Self-pay | Admitting: Physician Assistant

## 2021-01-03 ENCOUNTER — Ambulatory Visit (HOSPITAL_COMMUNITY)
Admission: RE | Admit: 2021-01-03 | Discharge: 2021-01-03 | Disposition: A | Discharge status: Home / Self Care | Payer: Medicare Other | Source: Ambulatory Visit | Attending: Physician Assistant | Admitting: Physician Assistant

## 2021-01-03 VITALS — BP 116/60 | HR 59 | Ht 64.0 in | Wt 171.6 lb

## 2021-01-03 DIAGNOSIS — Z95818 Presence of other cardiac implants and grafts: Secondary | ICD-10-CM | POA: Diagnosis not present

## 2021-01-03 DIAGNOSIS — Z87891 Personal history of nicotine dependence: Secondary | ICD-10-CM | POA: Insufficient documentation

## 2021-01-03 DIAGNOSIS — Z6379 Other stressful life events affecting family and household: Secondary | ICD-10-CM | POA: Insufficient documentation

## 2021-01-03 DIAGNOSIS — D6869 Other thrombophilia: Secondary | ICD-10-CM | POA: Diagnosis not present

## 2021-01-03 DIAGNOSIS — Z7901 Long term (current) use of anticoagulants: Secondary | ICD-10-CM | POA: Insufficient documentation

## 2021-01-03 DIAGNOSIS — I10 Essential (primary) hypertension: Secondary | ICD-10-CM | POA: Diagnosis not present

## 2021-01-03 DIAGNOSIS — I480 Paroxysmal atrial fibrillation: Secondary | ICD-10-CM | POA: Diagnosis not present

## 2021-01-03 DIAGNOSIS — Z79899 Other long term (current) drug therapy: Secondary | ICD-10-CM | POA: Insufficient documentation

## 2021-01-03 MED ORDER — MULTAQ 400 MG PO TABS: 400.0000 mg | ORAL_TABLET | Freq: Two times a day (BID) | ORAL | 6 refills | Status: DC

## 2021-01-03 NOTE — Progress Notes (Signed)
Primary Care Physician: [name], MD Primary Electrophysiologist: Dr Rayann Heman Referring Physician: Device clinic/Renee Charlcie Cradle PA   Heather Cunningham is a 70 y.o. female with a history of paroxysmal atrial fibrillation and HTN who presents for follow up in the [facility].  The patient was initially diagnosed with atrial fibrillation remotely and has been maintained on PRN diltiazem. Patient is on Xarelto for a CHADS2VASC score of 3. She underwent ILR implant on 09/2019 for afib management. She reports that she has had more breakthrough episodes of afib with symptoms of generalized weakness. She states her husband was diagnosed with stage IV lung cancer and she has been under significant stress.   On follow up today, patient reports she has done well since her last visit. She denies any heart racing or palpitations. ILR shows 0% AF burden. She denies any bleeding issues on anticoagulation. She does have occasional dizziness and takes PRN meclizine.   Today, she denies symptoms of palpitations, chest pain, shortness of breath, orthopnea, PND, lower extremity edema, presyncope, syncope, snoring, daytime somnolence, bleeding, or neurologic sequela. The patient is tolerating medications without difficulties and is otherwise without complaint today.    Atrial Fibrillation Risk Factors:  she does not have symptoms or diagnosis of sleep apnea. she does not have a history of rheumatic fever. she does not have a history of alcohol use. The patient does not have a history of early familial atrial fibrillation or other arrhythmias.  she has a BMI of Body mass index is 29.46 kg/m.Marland Kitchen Filed Weights   01/03/21 1018  Weight: 77.8 kg    Family History  Problem Relation Age of Onset  . Cancer Other   . Heart failure Other   . Colon cancer Mother   . Colon cancer Maternal Grandmother   . Colon polyps Neg Hx      Atrial Fibrillation Management history:  Previous  antiarrhythmic drugs: Multaq Previous cardioversions: none Previous ablations: none CHADS2VASC score: 3 Anticoagulation history: Xarelto   Past Medical History:  Diagnosis Date  . Arthritis    back  . Asthma    reactive airway around cats and dogs  . Bilateral carpal tunnel syndrome   . Cancer (HCC)    Skin  . GERD (gastroesophageal reflux disease)   . Glaucoma   . Hypertension   . Paroxysmal atrial fibrillation (HCC)   . PONV (postoperative nausea and vomiting)   . Premature ventricular contraction   . Status post placement of implantable loop recorder    Past Surgical History:  Procedure Laterality Date  . BREAST EXCISIONAL BIOPSY Left 1996  . BREAST EXCISIONAL BIOPSY Right 1993  . BREAST LUMPECTOMY    . CATARACT EXTRACTION    . COLONOSCOPY N/A 09/05/2015   Procedure: COLONOSCOPY;  Surgeon: [name], MD;  [facility];  Service: Endoscopy;  Laterality: N/A;  incomplete colonoscopy  . COLONOSCOPY WITH PROPOFOL N/A 11/01/2015   YQM:[id number] hemorrhoids/five polyps/mild diverticulosis, tubular adenomas.  Next colonoscopy 3 years with propofol with overtube.  . COLONOSCOPY WITH PROPOFOL N/A 11/02/2019   Procedure: COLONOSCOPY WITH PROPOFOL;  Surgeon: [name], MD;  [facility];  Service: Endoscopy;  Laterality: N/A;  2:00pm with overtube  . EYE SURGERY    . HYSTEROSCOPY WITH D & C N/A 12/31/2016   Procedure: DILATATION AND CURETTAGE /HYSTEROSCOPY, POSSIBLE REMOVAL OF ENDOMETRIAL LESION;  Surgeon: [name], MD;  Location: [facility];  Service: Gynecology;  Laterality: N/A;  . implantable loop  recorder placement  09/28/2019   Medtronic Reveal [place] model [id number] ([id number]) implantable loop recorder implanted by Dr Rayann Heman in office   . Maxiofacial    . POLYPECTOMY  11/01/2015   Procedure: POLYPECTOMY;  Surgeon: [name], MD;  Location: [facility];  Service: Endoscopy;;  . POLYPECTOMY  11/02/2019   Procedure: POLYPECTOMY;  Surgeon:  [name], MD;  [facility];  Service: Endoscopy;;  . [place]  . TUBAL LIGATION      Current Outpatient Medications  Medication Sig Dispense Refill  . acetaminophen (TYLENOL) 500 MG tablet Take 500-1,000 mg by mouth every 6 (six) hours as needed for mild pain.     Marland Kitchen albuterol (VENTOLIN HFA) 108 (90 Base) MCG/ACT inhaler Inhale 2 puffs into the lungs every 6 (six) hours as needed for wheezing or shortness of breath.     . calcium carbonate (TUMS - DOSED IN MG ELEMENTAL CALCIUM) 500 MG chewable tablet Chew 1 tablet by mouth every other day. Alternates between tums and omeprazole in the evening before supper    . Calcium Carbonate-Vitamin D 600-400 MG-UNIT tablet Take 1 tablet by mouth every evening.    . diltiazem (CARDIZEM) 30 MG tablet Take 1 tablet (30 mg total) by mouth 4 (four) times daily as needed (episode of a-fib). 30 tablet 6  . docusate sodium (COLACE) 100 MG capsule Take 100 mg by mouth 2 (two) times daily.     . dorzolamide (TRUSOPT) 2 % ophthalmic solution Place 1 drop into both eyes 2 (two) times daily.     Marland Kitchen dronedarone (MULTAQ) 400 MG tablet Take 1 tablet (400 mg total) by mouth 2 (two) times daily with a meal. 60 tablet 3  . latanoprost (XALATAN) 0.005 % ophthalmic solution Place 1 drop into both eyes at bedtime.    Marland Kitchen lisinopril-hydrochlorothiazide (PRINZIDE,ZESTORETIC) 20-25 MG tablet Take 1 tablet by mouth at bedtime.     . meclizine (ANTIVERT) 25 MG tablet Take 25 mg by mouth 3 (three) times daily as needed for dizziness.    . Melatonin 3 MG CAPS Take 3 mg by mouth as needed (sleep). At bedtime for sleep    . metoprolol tartrate (LOPRESSOR) 50 MG tablet Take 1 tablet (50 mg total) by mouth 2 (two) times daily. 180 tablet 1  . omeprazole (PRILOSEC) 40 MG capsule Take 40 mg by mouth every other day. In the evening before supper    . simvastatin (ZOCOR) 10 MG tablet Take 1 tablet (10 mg total) by mouth daily. 30 tablet 3  .  XARELTO 20 MG TABS tablet TAKE 1 TABLET BY MOUTH EVERY DAY WITH SUPPER 30 tablet 6   No current facility-administered medications for this encounter.    Allergies  Allergen Reactions  . Other     CANNOT RECEIVE AN MRI DUE TO METAL BAND (RIGHT EYE) DUE TO DETATCHED RETINA     Social History   Socioeconomic History  . Marital status: Married    Spouse name: [name]  . Number of children: 1  . Years of education: Not on file  . Highest education level: Not on file  Occupational History  . Occupation: [employer]    Employer: OTHER  Tobacco Use  . Smoking status: Former Smoker    Packs/day: 1.00    Years: 10.00    Pack years: 10.00    Types: Cigarettes    Start date: 12/17/1966    Quit date: 12/17/1976  Years since quitting: 44.0  . Smokeless tobacco: Never Used  . Tobacco comment: QUIT 35 YEARS AGO  Vaping Use  . Vaping Use: Never used  Substance and Sexual Activity  . Alcohol use: No    Alcohol/week: 0.0 standard drinks  . Drug use: No  . Sexual activity: Not on file  Other Topics Concern  . Not on file  Social History Narrative   Pt gets regular exercise. 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 WORKED FOR [employer] [place]. KIDS: [place], [place] WAS IN THE [employer].   Social Determinants of Health   Financial Resource Strain: Not on file  Food Insecurity: Not on file  Transportation Needs: Not on file  Physical Activity: Not on file  Stress: Not on file  Social Connections: Not on file  Intimate Partner Violence: Not on file     ROS- All systems are reviewed and negative except as per the HPI above.  Physical Exam: Vitals:   01/03/21 1018  BP: 116/60  Pulse: (!) 59  Weight: 77.8 kg  Height: 5\' 4"  (1.626 m)    GEN- The patient is well appearing, alert and oriented x 3 today.   HEENT-head normocephalic, atraumatic, sclera clear, conjunctiva pink, hearing intact, trachea midline. Lungs- Clear to ausculation  bilaterally, normal work of breathing Heart- Regular rate and rhythm, no murmurs, rubs or gallops  GI- soft, NT, ND, + BS Extremities- no clubbing, cyanosis, or edema MS- no significant deformity or atrophy Skin- no rash or lesion Psych- euthymic mood, full affect Neuro- strength and sensation are intact   Wt Readings from Last 3 Encounters:  01/03/21 77.8 kg  09/30/20 76.7 kg  08/23/20 75.9 kg    EKG today demonstrates  SB 1st degree AV block Vent. rate 59 BPM PR interval 208 ms QRS duration 78 ms QT/QTc 462/457 ms  Echo 11/30/15 demonstrated  - Left ventricle: The cavity size was normal. There was mild  concentric hypertrophy. Systolic function was normal. The  estimated ejection fraction was in the range of 60% to 65%. Wall  motion was normal; there were no regional wall motion  abnormalities. Left ventricular diastolic function parameters  were normal.  - Aortic valve: Trileaflet; normal thickness leaflets. There was no  regurgitation.  - Aortic root: The aortic root was normal in size.  - Ascending aorta: The ascending aorta was normal in size.  - Mitral valve: Structurally normal valve. There was no  regurgitation.  - Left atrium: The atrium was normal in size.  - Right ventricle: The cavity size was normal. Wall thickness was  normal. Systolic function was normal.  - Right atrium: The atrium was normal in size.  - Tricuspid valve: There was trivial regurgitation.  - Pulmonic valve: There was no regurgitation.  - Pulmonary arteries: Systolic pressure was within the normal  range.  - Inferior vena cava: The vessel was normal in size.  - Pericardium, extracardiac: There was no pericardial effusion.   Impressions:   - Normal study.   Epic records are reviewed at length today  CHA2DS2-VASc Score = 3  The patient's score is based upon: CHF History: No HTN History: Yes Diabetes History: No Stroke History: No Vascular Disease History: No       ASSESSMENT AND PLAN: 1. Paroxysmal Atrial Fibrillation (ICD10:  I48.0) The patient's CHA2DS2-VASc score is 3, indicating a 3.2% annual risk of stroke. ILR shows 0% AF burden. Continue Multaq 400 mg BID Continue diltiazem 120 mg daily. Continue  30 mg PRN q 4 hours. Continue Xarelto 20 mg daily Continue Lopressor 50 mg BID  2. Secondary Hypercoagulable State (ICD10:  D68.69) The patient is at significant risk for stroke/thromboembolism based upon her CHA2DS2-VASc Score of 3.  Continue Rivaroxaban (Xarelto).   3. HTN Stable, no changes today.   Follow up in the AF clinic in 4 months.    [facility] [address] [place], [place] [address] [phone] 01/03/2021 10:24 AM

## 2021-01-05 ENCOUNTER — Other Ambulatory Visit: Payer: Self-pay | Admitting: Family Medicine

## 2021-01-06 DIAGNOSIS — I10 Essential (primary) hypertension: Secondary | ICD-10-CM | POA: Diagnosis not present

## 2021-01-06 DIAGNOSIS — Z0000 Encounter for general adult medical examination without abnormal findings: Secondary | ICD-10-CM | POA: Diagnosis not present

## 2021-01-06 DIAGNOSIS — E7849 Other hyperlipidemia: Secondary | ICD-10-CM | POA: Diagnosis not present

## 2021-01-06 DIAGNOSIS — I480 Paroxysmal atrial fibrillation: Secondary | ICD-10-CM | POA: Diagnosis not present

## 2021-01-06 DIAGNOSIS — Z6829 Body mass index (BMI) 29.0-29.9, adult: Secondary | ICD-10-CM | POA: Diagnosis not present

## 2021-01-06 DIAGNOSIS — M810 Age-related osteoporosis without current pathological fracture: Secondary | ICD-10-CM | POA: Diagnosis not present

## 2021-01-12 NOTE — Progress Notes (Signed)
Carelink Summary Report / Loop Recorder 

## 2021-01-30 ENCOUNTER — Ambulatory Visit (INDEPENDENT_AMBULATORY_CARE_PROVIDER_SITE_OTHER): Payer: Medicare Other

## 2021-01-30 DIAGNOSIS — I480 Paroxysmal atrial fibrillation: Secondary | ICD-10-CM

## 2021-01-31 LAB — CUP PACEART REMOTE DEVICE CHECK
Date Time Interrogation Session: 20220215101647
Implantable Pulse Generator Implant Date: 20201012

## 2021-02-03 NOTE — Progress Notes (Signed)
Carelink Summary Report / Loop Recorder 

## 2021-03-02 ENCOUNTER — Ambulatory Visit (INDEPENDENT_AMBULATORY_CARE_PROVIDER_SITE_OTHER): Payer: Medicare Other

## 2021-03-02 DIAGNOSIS — R002 Palpitations: Secondary | ICD-10-CM | POA: Diagnosis not present

## 2021-03-06 LAB — CUP PACEART REMOTE DEVICE CHECK
Date Time Interrogation Session: 20220319230048
Implantable Pulse Generator Implant Date: 20201012

## 2021-03-09 ENCOUNTER — Other Ambulatory Visit: Payer: Self-pay | Admitting: Internal Medicine

## 2021-03-09 NOTE — Telephone Encounter (Signed)
[facility]-Fib clinic sees this pt for Dr. Rayann Heman. Please address

## 2021-03-10 NOTE — Progress Notes (Signed)
Carelink Summary Report / Loop Recorder 

## 2021-04-03 ENCOUNTER — Ambulatory Visit (INDEPENDENT_AMBULATORY_CARE_PROVIDER_SITE_OTHER): Payer: Medicare Other

## 2021-04-03 DIAGNOSIS — R002 Palpitations: Secondary | ICD-10-CM

## 2021-04-07 LAB — CUP PACEART REMOTE DEVICE CHECK
Date Time Interrogation Session: 20220421230401
Implantable Pulse Generator Implant Date: 20201012

## 2021-04-12 DIAGNOSIS — H401111 Primary open-angle glaucoma, right eye, mild stage: Secondary | ICD-10-CM | POA: Diagnosis not present

## 2021-04-12 DIAGNOSIS — H401121 Primary open-angle glaucoma, left eye, mild stage: Secondary | ICD-10-CM | POA: Diagnosis not present

## 2021-04-13 DIAGNOSIS — I10 Essential (primary) hypertension: Secondary | ICD-10-CM | POA: Diagnosis not present

## 2021-04-13 DIAGNOSIS — E7849 Other hyperlipidemia: Secondary | ICD-10-CM | POA: Diagnosis not present

## 2021-04-13 DIAGNOSIS — E782 Mixed hyperlipidemia: Secondary | ICD-10-CM | POA: Diagnosis not present

## 2021-04-13 DIAGNOSIS — R739 Hyperglycemia, unspecified: Secondary | ICD-10-CM | POA: Diagnosis not present

## 2021-04-19 NOTE — Progress Notes (Signed)
Carelink Summary Report / Loop Recorder 

## 2021-05-02 NOTE — Progress Notes (Signed)
Primary Care Physician: [name], MD Primary Electrophysiologist: Dr Rayann Heman Referring Physician: Device clinic/Renee Charlcie Cradle PA   Heather Cunningham is a 70 y.o. female with a history of paroxysmal atrial fibrillation and HTN who presents for follow up in the [facility].  The patient was initially diagnosed with atrial fibrillation remotely and has been maintained on PRN diltiazem. Patient is on Xarelto for a CHADS2VASC score of 3. She underwent ILR implant on 09/2019 for afib management. She reports that she has had more breakthrough episodes of afib with symptoms of generalized weakness. She states her husband was diagnosed with stage IV lung cancer and she has been under significant stress.   On follow up today, patient reports that she has done well since her last visit. Her ILR shows no new episodes of afib. She denies any bleeding issues on anticoagulation.   Today, she denies symptoms of palpitations, chest pain, shortness of breath, orthopnea, PND, lower extremity edema, presyncope, syncope, snoring, daytime somnolence, bleeding, or neurologic sequela. The patient is tolerating medications without difficulties and is otherwise without complaint today.    Atrial Fibrillation Risk Factors:  she does not have symptoms or diagnosis of sleep apnea. she does not have a history of rheumatic fever. she does not have a history of alcohol use. The patient does not have a history of early familial atrial fibrillation or other arrhythmias.  she has a BMI of Body mass index is 29.94 kg/m.Marland Kitchen Filed Weights   05/03/21 0958  Weight: 79.1 kg    Family History  Problem Relation Age of Onset  . Cancer Other   . Heart failure Other   . Colon cancer Mother   . Colon cancer Maternal Grandmother   . Colon polyps Neg Hx      Atrial Fibrillation Management history:  Previous antiarrhythmic drugs: Multaq Previous cardioversions: none Previous ablations:  none CHADS2VASC score: 3 Anticoagulation history: Xarelto   Past Medical History:  Diagnosis Date  . Arthritis    back  . Asthma    reactive airway around cats and dogs  . Bilateral carpal tunnel syndrome   . Cancer (HCC)    Skin  . GERD (gastroesophageal reflux disease)   . Glaucoma   . Hypertension   . Paroxysmal atrial fibrillation (HCC)   . PONV (postoperative nausea and vomiting)   . Premature ventricular contraction   . Status post placement of implantable loop recorder    Past Surgical History:  Procedure Laterality Date  . BREAST EXCISIONAL BIOPSY Left 1996  . BREAST EXCISIONAL BIOPSY Right 1993  . BREAST LUMPECTOMY    . CATARACT EXTRACTION    . COLONOSCOPY N/A 09/05/2015   Procedure: COLONOSCOPY;  Surgeon: [name], MD;  [facility];  Service: Endoscopy;  Laterality: N/A;  incomplete colonoscopy  . COLONOSCOPY WITH PROPOFOL N/A 11/01/2015   [id number] hemorrhoids/five polyps/mild diverticulosis, tubular adenomas.  Next colonoscopy 3 years with propofol with overtube.  . COLONOSCOPY WITH PROPOFOL N/A 11/02/2019   Procedure: COLONOSCOPY WITH PROPOFOL;  Surgeon: [name], MD;  [facility];  Service: Endoscopy;  Laterality: N/A;  2:00pm with overtube  . EYE SURGERY    . HYSTEROSCOPY WITH D & C N/A 12/31/2016   Procedure: DILATATION AND CURETTAGE /HYSTEROSCOPY, POSSIBLE REMOVAL OF ENDOMETRIAL LESION;  Surgeon: [name], MD;  Location: [facility];  Service: Gynecology;  Laterality: N/A;  . implantable loop recorder placement  09/28/2019   Medtronic Reveal Linq model [id number] (SN  [id number]) implantable loop recorder implanted by Dr Rayann Heman in office   . Maxiofacial    . POLYPECTOMY  11/01/2015   Procedure: POLYPECTOMY;  Surgeon: [name], MD;  Location: [facility];  Service: Endoscopy;;  . POLYPECTOMY  11/02/2019   Procedure: POLYPECTOMY;  Surgeon: [name], MD;  [facility];  Service: Endoscopy;;  . [place]  . TUBAL LIGATION      Current Outpatient Medications  Medication Sig Dispense Refill  . acetaminophen (TYLENOL) 500 MG tablet Take 500-1,000 mg by mouth every 6 (six) hours as needed for mild pain.     Marland Kitchen albuterol (VENTOLIN HFA) 108 (90 Base) MCG/ACT inhaler Inhale 2 puffs into the lungs every 6 (six) hours as needed for wheezing or shortness of breath.     . calcium carbonate (TUMS - DOSED IN MG ELEMENTAL CALCIUM) 500 MG chewable tablet Chew 1 tablet by mouth every other day. Alternates between tums and omeprazole in the evening before supper    . Calcium Carbonate-Vitamin D 600-400 MG-UNIT tablet Take 1 tablet by mouth every evening.    . diltiazem (CARDIZEM) 30 MG tablet Take 1 tablet (30 mg total) by mouth 4 (four) times daily as needed (episode of a-fib). 30 tablet 6  . docusate sodium (COLACE) 100 MG capsule Take 100 mg by mouth 2 (two) times daily.     . dorzolamide (TRUSOPT) 2 % ophthalmic solution Place 1 drop into both eyes 2 (two) times daily.     Marland Kitchen dronedarone (MULTAQ) 400 MG tablet Take 1 tablet (400 mg total) by mouth 2 (two) times daily with a meal. 60 tablet 6  . latanoprost (XALATAN) 0.005 % ophthalmic solution Place 1 drop into both eyes at bedtime.    Marland Kitchen lisinopril-hydrochlorothiazide (PRINZIDE,ZESTORETIC) 20-25 MG tablet Take 1 tablet by mouth at bedtime.     . meclizine (ANTIVERT) 25 MG tablet Take 25 mg by mouth 3 (three) times daily as needed for dizziness.    . Melatonin 3 MG CAPS Take 3 mg by mouth as needed (sleep). At bedtime for sleep    . metoprolol tartrate (LOPRESSOR) 50 MG tablet TAKE 1 TABLET BY MOUTH TWICE DAILY 60 tablet 1  . omeprazole (PRILOSEC) 40 MG capsule Take 40 mg by mouth every other day. In the evening before supper    . simvastatin (ZOCOR) 20 MG tablet     . XARELTO 20 MG TABS tablet TAKE 1 TABLET BY MOUTH EVERY DAY WITH SUPPER 30 tablet 6   No current facility-administered medications for this encounter.     Allergies  Allergen Reactions  . Other     CANNOT RECEIVE AN MRI DUE TO METAL BAND (RIGHT EYE) DUE TO DETATCHED RETINA     Social History   Socioeconomic History  . Marital status: Married    Spouse name: [name]  . Number of children: 1  . Years of education: Not on file  . Highest education level: Not on file  Occupational History  . Occupation: [employer]    Employer: OTHER  Tobacco Use  . Smoking status: Former Smoker    Packs/day: 1.00    Years: 10.00    Pack years: 10.00    Types: Cigarettes    Start date: 12/17/1966    Quit date: 12/17/1976    Years since quitting: 44.4  . Smokeless tobacco: Never Used  . Tobacco comment: QUIT 35 YEARS AGO  Vaping Use  . Vaping Use:  Never used  Substance and Sexual Activity  . Alcohol use: No    Alcohol/week: 0.0 standard drinks  . Drug use: No  . Sexual activity: Not on file  Other Topics Concern  . Not on file  Social History Narrative   Pt gets regular exercise. 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. KIDS: [place], [place] WAS IN THE [employer].   Social Determinants of Health   Financial Resource Strain: Not on file  Food Insecurity: Not on file  Transportation Needs: Not on file  Physical Activity: Not on file  Stress: Not on file  Social Connections: Not on file  Intimate Partner Violence: Not on file     ROS- All systems are reviewed and negative except as per the HPI above.  Physical Exam: Vitals:   05/03/21 0958  BP: 118/80  Pulse: (!) 54  Weight: 79.1 kg  Height: 5\' 4"  (1.626 m)    GEN- The patient is a well appearing female, alert and oriented x 3 today.   HEENT-head normocephalic, atraumatic, sclera clear, conjunctiva pink, hearing intact, trachea midline. Lungs- Clear to ausculation bilaterally, normal work of breathing Heart- Regular rate and rhythm, bradycardia, no murmurs, rubs or gallops  GI- soft, NT, ND, +  BS Extremities- no clubbing, cyanosis, or edema MS- no significant deformity or atrophy Skin- no rash or lesion Psych- euthymic mood, full affect Neuro- strength and sensation are intact   Wt Readings from Last 3 Encounters:  05/03/21 79.1 kg  01/03/21 77.8 kg  09/30/20 76.7 kg    EKG today demonstrates  SB, 1st degree AV block Vent. rate 54 BPM PR interval 224 ms QRS duration 80 ms QT/QTcB 468/443 ms  Echo 11/30/15 demonstrated  - Left ventricle: The cavity size was normal. There was mild  concentric hypertrophy. Systolic function was normal. The  estimated ejection fraction was in the range of 60% to 65%. Wall  motion was normal; there were no regional wall motion  abnormalities. Left ventricular diastolic function parameters  were normal.  - Aortic valve: Trileaflet; normal thickness leaflets. There was no  regurgitation.  - Aortic root: The aortic root was normal in size.  - Ascending aorta: The ascending aorta was normal in size.  - Mitral valve: Structurally normal valve. There was no  regurgitation.  - Left atrium: The atrium was normal in size.  - Right ventricle: The cavity size was normal. Wall thickness was  normal. Systolic function was normal.  - Right atrium: The atrium was normal in size.  - Tricuspid valve: There was trivial regurgitation.  - Pulmonic valve: There was no regurgitation.  - Pulmonary arteries: Systolic pressure was within the normal  range.  - Inferior vena cava: The vessel was normal in size.  - Pericardium, extracardiac: There was no pericardial effusion.   Impressions:   - Normal study.   Epic records are reviewed at length today  CHA2DS2-VASc Score = 3  The patient's score is based upon: CHF History: No HTN History: Yes Diabetes History: No Stroke History: No Vascular Disease History: No      ASSESSMENT AND PLAN: 1. Paroxysmal Atrial Fibrillation (ICD10:  I48.0) The patient's CHA2DS2-VASc score is 3,  indicating a 3.2% annual risk of stroke. ILR shows no new afib episodes. Continue Multaq 400 mg BID Continue diltiazem 120 mg daily. Continue 30 mg PRN q 4 hours. Continue Xarelto 20 mg daily Continue Lopressor 50 mg BID  2. Secondary Hypercoagulable State (  ICD10:  D68.69) The patient is at significant risk for stroke/thromboembolism based upon her CHA2DS2-VASc Score of 3.  Continue Rivaroxaban (Xarelto).   3. HTN Stable, no changes today.   Follow up in the AF clinic in 6 months.   [facility] [address] [place], [place] [address] [phone] 05/03/2021 10:22 AM

## 2021-05-03 ENCOUNTER — Other Ambulatory Visit: Payer: Self-pay

## 2021-05-03 ENCOUNTER — Ambulatory Visit (HOSPITAL_COMMUNITY)
Admission: RE | Admit: 2021-05-03 | Discharge: 2021-05-03 | Disposition: A | Discharge status: Home / Self Care | Payer: Medicare Other | Source: Ambulatory Visit | Attending: Physician Assistant | Admitting: Physician Assistant

## 2021-05-03 ENCOUNTER — Encounter (HOSPITAL_COMMUNITY): Payer: Self-pay | Admitting: Physician Assistant

## 2021-05-03 VITALS — BP 118/80 | HR 54 | Ht 64.0 in | Wt 174.4 lb

## 2021-05-03 DIAGNOSIS — I10 Essential (primary) hypertension: Secondary | ICD-10-CM | POA: Diagnosis not present

## 2021-05-03 DIAGNOSIS — I480 Paroxysmal atrial fibrillation: Secondary | ICD-10-CM | POA: Diagnosis not present

## 2021-05-03 DIAGNOSIS — Z8249 Family history of ischemic heart disease and other diseases of the circulatory system: Secondary | ICD-10-CM | POA: Insufficient documentation

## 2021-05-03 DIAGNOSIS — D6869 Other thrombophilia: Secondary | ICD-10-CM | POA: Insufficient documentation

## 2021-05-03 DIAGNOSIS — Z87891 Personal history of nicotine dependence: Secondary | ICD-10-CM | POA: Diagnosis not present

## 2021-05-03 DIAGNOSIS — Z79899 Other long term (current) drug therapy: Secondary | ICD-10-CM | POA: Diagnosis not present

## 2021-05-03 DIAGNOSIS — Z7901 Long term (current) use of anticoagulants: Secondary | ICD-10-CM | POA: Insufficient documentation

## 2021-05-03 LAB — COMPREHENSIVE METABOLIC PANEL
ALT: 24 U/L (ref 0–44)
AST: 30 U/L (ref 15–41)
Albumin: 3.7 g/dL (ref 3.5–5.0)
Alkaline Phosphatase: 49 U/L (ref 38–126)
Anion gap: 7 (ref 5–15)
BUN: 15 mg/dL (ref 8–23)
CO2: 27 mmol/L (ref 22–32)
Calcium: 9 mg/dL (ref 8.9–10.3)
Chloride: 102 mmol/L (ref 98–111)
Creatinine, Ser: 0.63 mg/dL (ref 0.44–1.00)
GFR, Estimated: >60 mL/min (ref >60)
Glucose, Bld: 108 mg/dL — ABNORMAL HIGH (ref 70–99)
Potassium: 3.9 mmol/L (ref 3.5–5.1)
Sodium: 136 mmol/L (ref 135–145)
Total Bilirubin: 0.6 mg/dL (ref 0.3–1.2)
Total Protein: 7.3 g/dL (ref 6.5–8.1)

## 2021-05-03 LAB — CBC
HCT: 39.5 % (ref 36.0–46.0)
Hemoglobin: 12.9 g/dL (ref 12.0–15.0)
MCH: 30.6 pg (ref 26.0–34.0)
MCHC: 32.7 g/dL (ref 30.0–36.0)
MCV: 93.8 fL (ref 80.0–100.0)
Platelets: 262 10*3/uL (ref 150–400)
RBC: 4.21 MIL/uL (ref 3.87–5.11)
RDW: 12.7 % (ref 11.5–15.5)
WBC: 8.1 10*3/uL (ref 4.0–10.5)
nRBC: 0 % (ref 0.0–0.2)

## 2021-05-04 ENCOUNTER — Ambulatory Visit (INDEPENDENT_AMBULATORY_CARE_PROVIDER_SITE_OTHER): Payer: Medicare Other

## 2021-05-04 DIAGNOSIS — I480 Paroxysmal atrial fibrillation: Secondary | ICD-10-CM | POA: Diagnosis not present

## 2021-05-11 ENCOUNTER — Other Ambulatory Visit: Payer: Self-pay | Admitting: Physician Assistant

## 2021-05-11 LAB — CUP PACEART REMOTE DEVICE CHECK
Date Time Interrogation Session: 20220524230354
Implantable Pulse Generator Implant Date: 20201012

## 2021-05-25 ENCOUNTER — Other Ambulatory Visit: Payer: Self-pay | Admitting: Physician Assistant

## 2021-05-26 NOTE — Progress Notes (Signed)
Carelink Summary Report / Loop Recorder 

## 2021-06-05 ENCOUNTER — Ambulatory Visit: Payer: Medicare Other

## 2021-06-12 ENCOUNTER — Ambulatory Visit (INDEPENDENT_AMBULATORY_CARE_PROVIDER_SITE_OTHER): Payer: Medicare Other

## 2021-06-12 DIAGNOSIS — I480 Paroxysmal atrial fibrillation: Secondary | ICD-10-CM

## 2021-06-13 ENCOUNTER — Telehealth: Payer: Self-pay

## 2021-06-13 LAB — CUP PACEART REMOTE DEVICE CHECK
Date Time Interrogation Session: 20220626230533
Implantable Pulse Generator Implant Date: 20201012

## 2021-06-13 NOTE — Telephone Encounter (Signed)
2 ILR symptom alerts received. 1 event logged was triggered for pause, noted undersensing. Second alert received on 06/08/21, appear PAC, patient reports she was laying in bed and felt palpitations. Patient reports compliance with all medications on file. Advised patient I would forward to Dr. Rayann Heman for review but anticipate monitoring. Advised I will follow up with patient if any changes are recommended. Patient appreciative of call and agreeable to plan.  [phone]

## 2021-06-30 NOTE — Progress Notes (Signed)
Carelink Summary Report / Loop Recorder 

## 2021-07-01 NOTE — Telephone Encounter (Signed)
Continue to monitor

## 2021-07-13 ENCOUNTER — Ambulatory Visit (INDEPENDENT_AMBULATORY_CARE_PROVIDER_SITE_OTHER): Payer: Medicare Other

## 2021-07-13 DIAGNOSIS — I480 Paroxysmal atrial fibrillation: Secondary | ICD-10-CM

## 2021-07-15 LAB — CUP PACEART REMOTE DEVICE CHECK
Date Time Interrogation Session: 20220729230134
Implantable Pulse Generator Implant Date: 20201012

## 2021-07-19 DIAGNOSIS — Z85828 Personal history of other malignant neoplasm of skin: Secondary | ICD-10-CM | POA: Diagnosis not present

## 2021-07-19 DIAGNOSIS — L820 Inflamed seborrheic keratosis: Secondary | ICD-10-CM | POA: Diagnosis not present

## 2021-07-19 DIAGNOSIS — L821 Other seborrheic keratosis: Secondary | ICD-10-CM | POA: Diagnosis not present

## 2021-07-19 DIAGNOSIS — C44619 Basal cell carcinoma of skin of left upper limb, including shoulder: Secondary | ICD-10-CM | POA: Diagnosis not present

## 2021-07-19 DIAGNOSIS — D485 Neoplasm of uncertain behavior of skin: Secondary | ICD-10-CM | POA: Diagnosis not present

## 2021-07-19 DIAGNOSIS — L570 Actinic keratosis: Secondary | ICD-10-CM | POA: Diagnosis not present

## 2021-07-27 ENCOUNTER — Other Ambulatory Visit: Payer: Self-pay | Admitting: Internal Medicine

## 2021-07-27 ENCOUNTER — Other Ambulatory Visit: Payer: Self-pay | Admitting: *Deleted

## 2021-07-27 MED ORDER — MULTAQ 400 MG PO TABS: 400.0000 mg | ORAL_TABLET | Freq: Two times a day (BID) | ORAL | 6 refills | Status: DC

## 2021-07-27 NOTE — Telephone Encounter (Signed)
Spoke to pt and she needs a refill of her multaq  ('400mg'$  tablet strength) sent to [facility] drug.

## 2021-07-27 NOTE — Telephone Encounter (Addendum)
Prescription refill request for Xarelto received.  Indication: afib  Last office visit: allred 09/30/2020 Weight: 79.1 kg  Age: 70 yo  Scr: 0.63, 05/03/2021 CrCl: 15m/min   Refill sent.

## 2021-08-09 NOTE — Progress Notes (Signed)
Carelink Summary Report / Loop Recorder 

## 2021-08-14 ENCOUNTER — Ambulatory Visit (INDEPENDENT_AMBULATORY_CARE_PROVIDER_SITE_OTHER): Payer: Medicare Other

## 2021-08-14 DIAGNOSIS — I480 Paroxysmal atrial fibrillation: Secondary | ICD-10-CM

## 2021-08-17 DIAGNOSIS — Z6829 Body mass index (BMI) 29.0-29.9, adult: Secondary | ICD-10-CM | POA: Diagnosis not present

## 2021-08-17 DIAGNOSIS — M62838 Other muscle spasm: Secondary | ICD-10-CM | POA: Diagnosis not present

## 2021-08-17 LAB — CUP PACEART REMOTE DEVICE CHECK
Date Time Interrogation Session: 20220901083202
Implantable Pulse Generator Implant Date: 20201012

## 2021-08-24 DIAGNOSIS — L988 Other specified disorders of the skin and subcutaneous tissue: Secondary | ICD-10-CM | POA: Diagnosis not present

## 2021-08-24 DIAGNOSIS — C44619 Basal cell carcinoma of skin of left upper limb, including shoulder: Secondary | ICD-10-CM | POA: Diagnosis not present

## 2021-08-25 NOTE — Progress Notes (Signed)
Carelink Summary Report / Loop Recorder 

## 2021-09-14 ENCOUNTER — Ambulatory Visit (INDEPENDENT_AMBULATORY_CARE_PROVIDER_SITE_OTHER): Payer: Medicare Other

## 2021-09-14 DIAGNOSIS — I480 Paroxysmal atrial fibrillation: Secondary | ICD-10-CM | POA: Diagnosis not present

## 2021-09-19 LAB — CUP PACEART REMOTE DEVICE CHECK
Date Time Interrogation Session: 20221001000639
Implantable Pulse Generator Implant Date: 20201012

## 2021-09-22 NOTE — Progress Notes (Signed)
Carelink Summary Report / Loop Recorder 

## 2021-10-02 DIAGNOSIS — H401121 Primary open-angle glaucoma, left eye, mild stage: Secondary | ICD-10-CM | POA: Diagnosis not present

## 2021-10-02 DIAGNOSIS — H401111 Primary open-angle glaucoma, right eye, mild stage: Secondary | ICD-10-CM | POA: Diagnosis not present

## 2021-10-02 DIAGNOSIS — H401131 Primary open-angle glaucoma, bilateral, mild stage: Secondary | ICD-10-CM | POA: Diagnosis not present

## 2021-10-03 DIAGNOSIS — Z23 Encounter for immunization: Secondary | ICD-10-CM | POA: Diagnosis not present

## 2021-10-16 ENCOUNTER — Ambulatory Visit (INDEPENDENT_AMBULATORY_CARE_PROVIDER_SITE_OTHER): Payer: Medicare Other

## 2021-10-16 DIAGNOSIS — I480 Paroxysmal atrial fibrillation: Secondary | ICD-10-CM | POA: Diagnosis not present

## 2021-10-23 LAB — CUP PACEART REMOTE DEVICE CHECK
Date Time Interrogation Session: 20221105231109
Implantable Pulse Generator Implant Date: 20201012

## 2021-10-24 NOTE — Progress Notes (Signed)
Carelink Summary Report / Loop Recorder 

## 2021-11-01 ENCOUNTER — Other Ambulatory Visit: Payer: Self-pay

## 2021-11-01 ENCOUNTER — Ambulatory Visit (HOSPITAL_COMMUNITY)
Admission: RE | Admit: 2021-11-01 | Discharge: 2021-11-01 | Disposition: A | Discharge status: Home / Self Care | Payer: Medicare Other | Source: Ambulatory Visit | Attending: Physician Assistant | Admitting: Physician Assistant

## 2021-11-01 VITALS — BP 140/66 | HR 55 | Ht 64.0 in | Wt 169.4 lb

## 2021-11-01 DIAGNOSIS — R0683 Snoring: Secondary | ICD-10-CM | POA: Diagnosis not present

## 2021-11-01 DIAGNOSIS — Z7902 Long term (current) use of antithrombotics/antiplatelets: Secondary | ICD-10-CM | POA: Insufficient documentation

## 2021-11-01 DIAGNOSIS — I480 Paroxysmal atrial fibrillation: Secondary | ICD-10-CM | POA: Diagnosis not present

## 2021-11-01 DIAGNOSIS — D6869 Other thrombophilia: Secondary | ICD-10-CM | POA: Diagnosis not present

## 2021-11-01 DIAGNOSIS — I10 Essential (primary) hypertension: Secondary | ICD-10-CM | POA: Diagnosis not present

## 2021-11-01 DIAGNOSIS — Z79899 Other long term (current) drug therapy: Secondary | ICD-10-CM | POA: Diagnosis not present

## 2021-11-01 DIAGNOSIS — Z6379 Other stressful life events affecting family and household: Secondary | ICD-10-CM | POA: Diagnosis not present

## 2021-11-01 DIAGNOSIS — G4730 Sleep apnea, unspecified: Secondary | ICD-10-CM | POA: Diagnosis not present

## 2021-11-01 NOTE — Progress Notes (Signed)
Primary Care Physician: [name], MD Primary Electrophysiologist: Dr Rayann Heman Referring Physician: Device clinic/Renee Charlcie Cradle PA   Heather Cunningham is a 70 y.o. female with a history of paroxysmal atrial fibrillation and HTN who presents for follow up in the [facility].  The patient was initially diagnosed with atrial fibrillation remotely and has been maintained on PRN diltiazem. Patient is on Xarelto for a CHADS2VASC score of 3. She underwent ILR implant on 09/2019 for afib management. She reports that she has had more breakthrough episodes of afib with symptoms of generalized weakness. She states her husband was diagnosed with stage IV lung cancer and she has been under significant stress.   On follow up today, patient reports that she has done well since her last visit. ILR shows 0% afib burden. She does report fatigue at times. She states she does not sleep well at night and does snore.   Today, she denies symptoms of palpitations, chest pain, shortness of breath, orthopnea, PND, lower extremity edema, presyncope, syncope, bleeding, or neurologic sequela. The patient is tolerating medications without difficulties and is otherwise without complaint today.    Atrial Fibrillation Risk Factors:  she does have symptoms or diagnosis of sleep apnea. she does not have a history of rheumatic fever. she does not have a history of alcohol use. The patient does not have a history of early familial atrial fibrillation or other arrhythmias.  she has a BMI of Body mass index is 29.08 kg/m.Marland Kitchen Filed Weights   11/01/21 0943  Weight: 76.8 kg     Family History  Problem Relation Age of Onset   Cancer Other    Heart failure Other    Colon cancer Mother    Colon cancer Maternal Grandmother    Colon polyps Neg Hx      Atrial Fibrillation Management history:  Previous antiarrhythmic drugs: Multaq Previous cardioversions: none Previous ablations: none CHADS2VASC  score: 3 Anticoagulation history: Xarelto   Past Medical History:  Diagnosis Date   Arthritis    back   Asthma    reactive airway around cats and dogs   Bilateral carpal tunnel syndrome    Cancer ([place])    Skin   GERD (gastroesophageal reflux disease)    Glaucoma    Hypertension    Paroxysmal atrial fibrillation (HCC)    PONV (postoperative nausea and vomiting)    Premature ventricular contraction    Status post placement of implantable loop recorder    Past Surgical History:  Procedure Laterality Date   BREAST EXCISIONAL BIOPSY Left 1996   BREAST EXCISIONAL BIOPSY Right 1993   BREAST LUMPECTOMY     CATARACT EXTRACTION     COLONOSCOPY N/A 09/05/2015   Procedure: COLONOSCOPY;  Surgeon: [name], MD;  [facility];  Service: Endoscopy;  Laterality: N/A;  incomplete colonoscopy   COLONOSCOPY WITH PROPOFOL N/A 11/01/2015   [id number] hemorrhoids/five polyps/mild diverticulosis, tubular adenomas.  Next colonoscopy 3 years with propofol with overtube.   COLONOSCOPY WITH PROPOFOL N/A 11/02/2019   Procedure: COLONOSCOPY WITH PROPOFOL;  Surgeon: [name], MD;  [facility];  Service: Endoscopy;  Laterality: N/A;  2:00pm with overtube   EYE SURGERY     HYSTEROSCOPY WITH D & C N/A 12/31/2016   Procedure: DILATATION AND CURETTAGE /HYSTEROSCOPY, POSSIBLE REMOVAL OF ENDOMETRIAL LESION;  Surgeon: [name], MD;  Location: [facility];  Service: Gynecology;  Laterality: N/A;   implantable loop recorder placement  09/28/2019   Medtronic  Reveal Linq model [id number] (SN [id number]) implantable loop recorder implanted by Dr Rayann Heman in office    Maxiofacial     POLYPECTOMY  11/01/2015   Procedure: POLYPECTOMY;  Surgeon: [name], MD;  Location: [facility];  Service: Endoscopy;;   POLYPECTOMY  11/02/2019   Procedure: POLYPECTOMY;  Surgeon: [name], MD;  [facility];  Service: Endoscopy;;   RETINAL DETACHMENT SURGERY  1997  1999   TUBAL LIGATION       Current Outpatient Medications  Medication Sig Dispense Refill   acetaminophen (TYLENOL) 500 MG tablet Take 500-1,000 mg by mouth every 6 (six) hours as needed for mild pain.      albuterol (VENTOLIN HFA) 108 (90 Base) MCG/ACT inhaler Inhale 2 puffs into the lungs every 6 (six) hours as needed for wheezing or shortness of breath.      calcium carbonate (TUMS - DOSED IN MG ELEMENTAL CALCIUM) 500 MG chewable tablet Chew 1 tablet by mouth every other day. Alternates between tums and omeprazole in the evening before supper     Calcium Carbonate-Vitamin D 600-400 MG-UNIT tablet Take 1 tablet by mouth every evening.     diltiazem (CARDIZEM) 30 MG tablet Take 1 tablet (30 mg total) by mouth 4 (four) times daily as needed (episode of a-fib). 30 tablet 6   docusate sodium (COLACE) 100 MG capsule Take 100 mg by mouth 2 (two) times daily.      dorzolamide (TRUSOPT) 2 % ophthalmic solution Place 1 drop into both eyes 2 (two) times daily.      dronedarone (MULTAQ) 400 MG tablet Take 1 tablet (400 mg total) by mouth 2 (two) times daily with a meal. 60 tablet 6   latanoprost (XALATAN) 0.005 % ophthalmic solution Place 1 drop into both eyes at bedtime.     lisinopril-hydrochlorothiazide (PRINZIDE,ZESTORETIC) 20-25 MG tablet Take 1 tablet by mouth at bedtime.      meclizine (ANTIVERT) 25 MG tablet Take 25 mg by mouth 3 (three) times daily as needed for dizziness.     Melatonin 3 MG CAPS Take 3 mg by mouth as needed (sleep). At bedtime for sleep     metoprolol tartrate (LOPRESSOR) 50 MG tablet Take 1 tablet (50 mg total) by mouth 2 (two) times daily. 60 tablet 6   omeprazole (PRILOSEC) 40 MG capsule Take 40 mg by mouth every other day. In the evening before supper     rivaroxaban (XARELTO) 20 MG TABS tablet TAKE 1 TABLET BY MOUTH EVERY DAY WITH SUPPER 30 tablet 5   simvastatin (ZOCOR) 20 MG tablet Take 20 mg by mouth at bedtime.     No current facility-administered medications for this encounter.     Allergies  Allergen Reactions   Other     CANNOT RECEIVE AN MRI DUE TO METAL BAND (RIGHT EYE) DUE TO DETATCHED RETINA     Social History   Socioeconomic History   Marital status: Married    Spouse name: [name]   Number of children: 1   Years of education: Not on file   Highest education level: Not on file  Occupational History   Occupation: [employer]    Employer: OTHER  Tobacco Use   Smoking status: Former    Packs/day: 1.00    Years: 10.00    Pack years: 10.00    Types: Cigarettes    Start date: 12/17/1966    Quit date: 12/17/1976    Years since quitting: 44.9   Smokeless tobacco: Never  Tobacco comments:    QUIT 35 YEARS AGO  Vaping Use   Vaping Use: Never used  Substance and Sexual Activity   Alcohol use: No    Alcohol/week: 0.0 standard drinks   Drug use: No   Sexual activity: Not on file  Other Topics Concern   Not on file  Social History Narrative   Pt gets regular exercise. 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. KIDS: [place], [place] WAS IN THE [employer].   Social Determinants of Health   Financial Resource Strain: Not on file  Food Insecurity: Not on file  Transportation Needs: Not on file  Physical Activity: Not on file  Stress: Not on file  Social Connections: Not on file  Intimate Partner Violence: Not on file     ROS- All systems are reviewed and negative except as per the HPI above.  Physical Exam: Vitals:   11/01/21 0943  BP: 140/66  Pulse: (!) 55  Weight: 76.8 kg  Height: 5\' 4"  (1.626 m)    GEN- The patient is a well appearing female, alert and oriented x 3 today.   HEENT-head normocephalic, atraumatic, sclera clear, conjunctiva pink, hearing intact, trachea midline. Lungs- Clear to ausculation bilaterally, normal work of breathing Heart- Regular rate and rhythm, no murmurs, rubs or gallops  GI- soft, NT, ND, + BS Extremities- no clubbing, cyanosis, or  edema MS- no significant deformity or atrophy Skin- no rash or lesion Psych- euthymic mood, full affect Neuro- strength and sensation are intact   Wt Readings from Last 3 Encounters:  11/01/21 76.8 kg  05/03/21 79.1 kg  01/03/21 77.8 kg    EKG today demonstrates  SB, 1st degree AV block Vent. rate 55 BPM PR interval 210 ms QRS duration 76 ms QT/QTcB 484/463 ms  Echo 11/30/15 demonstrated  - Left ventricle: The cavity size was normal. There was mild    concentric hypertrophy. Systolic function was normal. The    estimated ejection fraction was in the range of 60% to 65%. Wall    motion was normal; there were no regional wall motion    abnormalities. Left ventricular diastolic function parameters    were normal.  - Aortic valve: Trileaflet; normal thickness leaflets. There was no    regurgitation.  - Aortic root: The aortic root was normal in size.  - Ascending aorta: The ascending aorta was normal in size.  - Mitral valve: Structurally normal valve. There was no    regurgitation.  - Left atrium: The atrium was normal in size.  - Right ventricle: The cavity size was normal. Wall thickness was    normal. Systolic function was normal.  - Right atrium: The atrium was normal in size.  - Tricuspid valve: There was trivial regurgitation.  - Pulmonic valve: There was no regurgitation.  - Pulmonary arteries: Systolic pressure was within the normal    range.  - Inferior vena cava: The vessel was normal in size.  - Pericardium, extracardiac: There was no pericardial effusion.   Impressions:   - Normal study.   Epic records are reviewed at length today  CHA2DS2-VASc Score = 3  The patient's score is based upon: CHF History: 0 HTN History: 1 Diabetes History: 0 Stroke History: 0 Vascular Disease History: 0 Age Score: 1 Gender Score: 1       ASSESSMENT AND PLAN: 1. Paroxysmal Atrial Fibrillation (ICD10:  I48.0) The patient's CHA2DS2-VASc score is 3, indicating a 3.2%  annual risk  of stroke. ILR shows 0% afib burden. Continue Multaq 400 mg BID Continue diltiazem 120 mg daily. Continue 30 mg PRN q 4 hours. Continue Xarelto 20 mg daily Continue Lopressor 50 mg BID  2. Secondary Hypercoagulable State (ICD10:  D68.69) The patient is at significant risk for stroke/thromboembolism based upon her CHA2DS2-VASc Score of 3.  Continue Rivaroxaban (Xarelto).   3. HTN Stable, no changes today.  4. Snoring Patient declines sleep evaluation at this time.    Follow up in the AF clinic in 6 months.    [facility] [address] [place], [place] [address] [phone] 11/01/2021 10:18 AM

## 2021-11-16 ENCOUNTER — Ambulatory Visit (INDEPENDENT_AMBULATORY_CARE_PROVIDER_SITE_OTHER): Payer: Medicare Other

## 2021-11-16 DIAGNOSIS — I480 Paroxysmal atrial fibrillation: Secondary | ICD-10-CM

## 2021-11-16 LAB — CUP PACEART REMOTE DEVICE CHECK
Date Time Interrogation Session: 20221201121954
Implantable Pulse Generator Implant Date: 20201012

## 2021-11-20 DIAGNOSIS — E7849 Other hyperlipidemia: Secondary | ICD-10-CM | POA: Diagnosis not present

## 2021-11-20 DIAGNOSIS — E782 Mixed hyperlipidemia: Secondary | ICD-10-CM | POA: Diagnosis not present

## 2021-11-20 DIAGNOSIS — G4700 Insomnia, unspecified: Secondary | ICD-10-CM | POA: Diagnosis not present

## 2021-11-20 DIAGNOSIS — R5383 Other fatigue: Secondary | ICD-10-CM | POA: Diagnosis not present

## 2021-11-20 DIAGNOSIS — I10 Essential (primary) hypertension: Secondary | ICD-10-CM | POA: Diagnosis not present

## 2021-11-20 DIAGNOSIS — I480 Paroxysmal atrial fibrillation: Secondary | ICD-10-CM | POA: Diagnosis not present

## 2021-11-20 DIAGNOSIS — R42 Dizziness and giddiness: Secondary | ICD-10-CM | POA: Diagnosis not present

## 2021-11-20 DIAGNOSIS — K219 Gastro-esophageal reflux disease without esophagitis: Secondary | ICD-10-CM | POA: Diagnosis not present

## 2021-11-21 ENCOUNTER — Other Ambulatory Visit: Payer: Self-pay | Admitting: Family Medicine

## 2021-11-21 DIAGNOSIS — Z1231 Encounter for screening mammogram for malignant neoplasm of breast: Secondary | ICD-10-CM

## 2021-11-28 NOTE — Progress Notes (Signed)
Carelink Summary Report / Loop Recorder 

## 2021-12-15 ENCOUNTER — Other Ambulatory Visit: Payer: Self-pay | Admitting: Physician Assistant

## 2021-12-19 ENCOUNTER — Ambulatory Visit (INDEPENDENT_AMBULATORY_CARE_PROVIDER_SITE_OTHER): Payer: Medicare Other

## 2021-12-19 DIAGNOSIS — I480 Paroxysmal atrial fibrillation: Secondary | ICD-10-CM

## 2021-12-20 LAB — CUP PACEART REMOTE DEVICE CHECK
Date Time Interrogation Session: 20230102230804
Implantable Pulse Generator Implant Date: 20201012

## 2021-12-27 ENCOUNTER — Ambulatory Visit: Payer: Medicare Other

## 2021-12-29 NOTE — Progress Notes (Signed)
Carelink Summary Report / Loop Recorder 

## 2022-01-02 ENCOUNTER — Encounter: Payer: Medicare Other | Admitting: Cardiology

## 2022-01-09 ENCOUNTER — Ambulatory Visit
Admission: RE | Admit: 2022-01-09 | Discharge: 2022-01-09 | Disposition: A | Discharge status: Home / Self Care | Payer: Medicare Other | Source: Ambulatory Visit | Attending: Family Medicine | Admitting: Family Medicine

## 2022-01-09 DIAGNOSIS — Z1231 Encounter for screening mammogram for malignant neoplasm of breast: Secondary | ICD-10-CM | POA: Diagnosis not present

## 2022-01-18 ENCOUNTER — Ambulatory Visit (INDEPENDENT_AMBULATORY_CARE_PROVIDER_SITE_OTHER): Payer: Medicare Other

## 2022-01-18 DIAGNOSIS — I480 Paroxysmal atrial fibrillation: Secondary | ICD-10-CM | POA: Diagnosis not present

## 2022-01-18 LAB — CUP PACEART REMOTE DEVICE CHECK
Date Time Interrogation Session: 20230202083358
Implantable Pulse Generator Implant Date: 20201012

## 2022-01-24 NOTE — Progress Notes (Signed)
Carelink Summary Report / Loop Recorder 

## 2022-01-25 ENCOUNTER — Other Ambulatory Visit: Payer: Self-pay | Admitting: Internal Medicine

## 2022-01-25 DIAGNOSIS — I480 Paroxysmal atrial fibrillation: Secondary | ICD-10-CM

## 2022-01-25 NOTE — Telephone Encounter (Signed)
Xarelto 20mg  refill request received. Pt is 71 years old, weight-76.8kg, Crea-0.63 on 05/03/2021, last seen by Malka So on 11/01/2021, Diagnosis-Afib, CrCl-97.100ml/min; Dose is appropriate based on dosing criteria. Will send in refill to requested pharmacy.

## 2022-02-10 ENCOUNTER — Other Ambulatory Visit: Payer: Self-pay | Admitting: Physician Assistant

## 2022-02-19 ENCOUNTER — Ambulatory Visit (INDEPENDENT_AMBULATORY_CARE_PROVIDER_SITE_OTHER): Payer: Medicare Other

## 2022-02-19 DIAGNOSIS — I493 Ventricular premature depolarization: Secondary | ICD-10-CM | POA: Diagnosis not present

## 2022-02-20 LAB — CUP PACEART REMOTE DEVICE CHECK
Date Time Interrogation Session: 20230307095417
Implantable Pulse Generator Implant Date: 20201012

## 2022-02-22 ENCOUNTER — Other Ambulatory Visit: Payer: Self-pay | Admitting: Physician Assistant

## 2022-02-22 DIAGNOSIS — I480 Paroxysmal atrial fibrillation: Secondary | ICD-10-CM

## 2022-03-01 DIAGNOSIS — M8580 Other specified disorders of bone density and structure, unspecified site: Secondary | ICD-10-CM | POA: Diagnosis not present

## 2022-03-01 DIAGNOSIS — M25552 Pain in left hip: Secondary | ICD-10-CM | POA: Diagnosis not present

## 2022-03-01 DIAGNOSIS — Z23 Encounter for immunization: Secondary | ICD-10-CM | POA: Diagnosis not present

## 2022-03-01 DIAGNOSIS — Z6829 Body mass index (BMI) 29.0-29.9, adult: Secondary | ICD-10-CM | POA: Diagnosis not present

## 2022-03-05 ENCOUNTER — Other Ambulatory Visit (HOSPITAL_COMMUNITY): Payer: Self-pay | Admitting: Family Medicine

## 2022-03-05 DIAGNOSIS — M810 Age-related osteoporosis without current pathological fracture: Secondary | ICD-10-CM

## 2022-03-05 NOTE — Progress Notes (Signed)
Carelink Summary Report / Loop Recorder 

## 2022-03-08 ENCOUNTER — Other Ambulatory Visit (HOSPITAL_COMMUNITY): Payer: Medicare Other

## 2022-03-22 ENCOUNTER — Ambulatory Visit (INDEPENDENT_AMBULATORY_CARE_PROVIDER_SITE_OTHER): Payer: Medicare Other

## 2022-03-22 DIAGNOSIS — R002 Palpitations: Secondary | ICD-10-CM | POA: Diagnosis not present

## 2022-03-22 LAB — CUP PACEART REMOTE DEVICE CHECK
Date Time Interrogation Session: 20230406104513
Implantable Pulse Generator Implant Date: 20201012

## 2022-03-26 ENCOUNTER — Ambulatory Visit (HOSPITAL_COMMUNITY)
Admission: RE | Admit: 2022-03-26 | Discharge: 2022-03-26 | Disposition: A | Discharge status: Home / Self Care | Payer: Medicare Other | Source: Ambulatory Visit | Attending: Family Medicine | Admitting: Family Medicine

## 2022-03-26 DIAGNOSIS — M85851 Other specified disorders of bone density and structure, right thigh: Secondary | ICD-10-CM | POA: Diagnosis not present

## 2022-03-26 DIAGNOSIS — M810 Age-related osteoporosis without current pathological fracture: Secondary | ICD-10-CM | POA: Diagnosis not present

## 2022-03-26 DIAGNOSIS — Z780 Asymptomatic menopausal state: Secondary | ICD-10-CM | POA: Diagnosis not present

## 2022-04-06 NOTE — Progress Notes (Signed)
Carelink Summary Report / Loop Recorder 

## 2022-04-11 ENCOUNTER — Other Ambulatory Visit (HOSPITAL_COMMUNITY): Payer: Self-pay

## 2022-04-11 ENCOUNTER — Encounter (HOSPITAL_COMMUNITY): Payer: Self-pay | Admitting: Physician Assistant

## 2022-04-11 ENCOUNTER — Ambulatory Visit (HOSPITAL_COMMUNITY)
Admission: RE | Admit: 2022-04-11 | Discharge: 2022-04-11 | Disposition: A | Discharge status: Home / Self Care | Payer: Medicare Other | Source: Ambulatory Visit | Attending: Physician Assistant | Admitting: Physician Assistant

## 2022-04-11 VITALS — BP 118/76 | HR 56 | Ht 64.0 in | Wt 170.0 lb

## 2022-04-11 DIAGNOSIS — I480 Paroxysmal atrial fibrillation: Secondary | ICD-10-CM | POA: Insufficient documentation

## 2022-04-11 DIAGNOSIS — E785 Hyperlipidemia, unspecified: Secondary | ICD-10-CM | POA: Diagnosis not present

## 2022-04-11 DIAGNOSIS — I10 Essential (primary) hypertension: Secondary | ICD-10-CM | POA: Insufficient documentation

## 2022-04-11 DIAGNOSIS — D6869 Other thrombophilia: Secondary | ICD-10-CM | POA: Diagnosis not present

## 2022-04-11 DIAGNOSIS — Z7901 Long term (current) use of anticoagulants: Secondary | ICD-10-CM | POA: Diagnosis not present

## 2022-04-11 LAB — CBC
HCT: 40.5 % (ref 36.0–46.0)
Hemoglobin: 13.5 g/dL (ref 12.0–15.0)
MCH: 30.8 pg (ref 26.0–34.0)
MCHC: 33.3 g/dL (ref 30.0–36.0)
MCV: 92.5 fL (ref 80.0–100.0)
Platelets: 300 10*3/uL (ref 150–400)
RBC: 4.38 MIL/uL (ref 3.87–5.11)
RDW: 13.1 % (ref 11.5–15.5)
WBC: 8.1 10*3/uL (ref 4.0–10.5)
nRBC: 0 % (ref 0.0–0.2)

## 2022-04-11 LAB — BASIC METABOLIC PANEL
Anion gap: 7 (ref 5–15)
BUN: 14 mg/dL (ref 8–23)
CO2: 26 mmol/L (ref 22–32)
Calcium: 9.2 mg/dL (ref 8.9–10.3)
Chloride: 103 mmol/L (ref 98–111)
Creatinine, Ser: 0.62 mg/dL (ref 0.44–1.00)
GFR, Estimated: >60 mL/min (ref >60)
Glucose, Bld: 102 mg/dL — ABNORMAL HIGH (ref 70–99)
Potassium: 4 mmol/L (ref 3.5–5.1)
Sodium: 136 mmol/L (ref 135–145)

## 2022-04-11 MED ORDER — SIMVASTATIN 10 MG PO TABS: 10.0000 mg | ORAL_TABLET | Freq: Every evening | ORAL | 0 refills | Status: AC

## 2022-04-11 MED ORDER — MULTAQ 400 MG PO TABS: 400.0000 mg | ORAL_TABLET | Freq: Two times a day (BID) | ORAL | 0 refills | Status: DC

## 2022-04-11 NOTE — Progress Notes (Signed)
? ? ?Primary Care Physician: Manon Hilding, MD ?Primary Electrophysiologist: Dr Curt Bears (former [facility] pt) ?Referring Physician: Device clinic/Renee Charlcie Cradle PA ? ? ?Heather Cunningham is a 71 y.o. female with a history of paroxysmal atrial fibrillation and HTN who presents for follow up in the [facility].  The patient was initially diagnosed with atrial fibrillation remotely and has been maintained on PRN diltiazem. Patient is on Xarelto for a CHADS2VASC score of 3. She underwent ILR implant on 09/2019 for afib management. She reports that she has had more breakthrough episodes of afib with symptoms of generalized weakness. She states her husband was diagnosed with stage IV lung cancer and she has been under significant stress.  ? ?On follow up today, patient reports doing very well since her last visit. She has a couple episodes where she has a small flutter lasting only a second. No sustained palpitations. ILR shows 0% burden. No bleeding issues on anticoagulation.  ? ?Today, she denies symptoms of chest pain, shortness of breath, orthopnea, PND, lower extremity edema, presyncope, syncope, bleeding, or neurologic sequela. The patient is tolerating medications without difficulties and is otherwise without complaint today.  ? ? ?Atrial Fibrillation Risk Factors: ? ?she does have symptoms or diagnosis of sleep apnea. ?she does not have a history of rheumatic fever. ?she does not have a history of alcohol use. ?The patient does not have a history of early familial atrial fibrillation or other arrhythmias. ? ?she has a BMI of Body mass index is 29.18 kg/m?Marland KitchenMarland Kitchen ?Filed Weights  ? 04/11/22 0952  ?Weight: 77.1 kg  ? ? ?Family History  ?Problem Relation Age of Onset  ? Cancer Other   ? Heart failure Other   ? Colon cancer Mother   ? Colon cancer Maternal Grandmother   ? Colon polyps Neg Hx   ? ? ? ?Atrial Fibrillation Management history: ? ?Previous antiarrhythmic drugs: Multaq ?Previous cardioversions:  none ?Previous ablations: none ?CHADS2VASC score: 3 ?Anticoagulation history: Xarelto ? ? ?Past Medical History:  ?Diagnosis Date  ? Arthritis   ? back  ? Asthma   ? reactive airway around cats and dogs  ? Bilateral carpal tunnel syndrome   ? Cancer [facility])   ? Skin  ? GERD (gastroesophageal reflux disease)   ? Glaucoma   ? Hypertension   ? Paroxysmal atrial fibrillation (HCC)   ? PONV (postoperative nausea and vomiting)   ? Premature ventricular contraction   ? Status post placement of implantable loop recorder   ? ?Past Surgical History:  ?Procedure Laterality Date  ? BREAST EXCISIONAL BIOPSY Left 1996  ? BREAST EXCISIONAL BIOPSY Right 1993  ? BREAST LUMPECTOMY    ? CATARACT EXTRACTION    ? COLONOSCOPY N/A 09/05/2015  ? Procedure: COLONOSCOPY;  Surgeon: [name], MD;  [facility];  Service: Endoscopy;  Laterality: N/A;  incomplete colonoscopy  ? COLONOSCOPY WITH PROPOFOL N/A 11/01/2015  ? FAO:ZHYQMVHQ hemorrhoids/five polyps/mild diverticulosis, tubular adenomas.  Next colonoscopy 3 years with propofol with overtube.  ? COLONOSCOPY WITH PROPOFOL N/A 11/02/2019  ? Procedure: COLONOSCOPY WITH PROPOFOL;  Surgeon: [name], MD;  [facility];  Service: Endoscopy;  Laterality: N/A;  2:00pm ?with overtube  ? EYE SURGERY    ? HYSTEROSCOPY WITH D & C N/A 12/31/2016  ? Procedure: DILATATION AND CURETTAGE /HYSTEROSCOPY, POSSIBLE REMOVAL OF ENDOMETRIAL LESION;  Surgeon: [name], MD;  Location: [facility];  Service: Gynecology;  Laterality: N/A;  ? implantable loop recorder placement  09/28/2019  ? Medtronic Reveal K[place] model [id number] ([place] [id number]) implantable loop recorder implanted by Dr Rayann Heman in office   ? Maxiofacial    ? POLYPECTOMY  11/01/2015  ? Procedure: POLYPECTOMY;  Surgeon: [name], MD;  Location: [facility];  Service: Endoscopy;;  ? POLYPECTOMY  11/02/2019  ? Procedure: POLYPECTOMY;  Surgeon: [name], MD;  [facility];  Service: Endoscopy;;  ? [place]  ? TUBAL LIGATION    ? ? ?Current Outpatient Medications  ?Medication Sig Dispense Refill  ? acetaminophen (TYLENOL) 500 MG tablet Take 500-1,000 mg by mouth every 6 (six) hours as needed for mild pain.     ? albuterol (VENTOLIN HFA) 108 (90 Base) MCG/ACT inhaler Inhale 2 puffs into the lungs every 6 (six) hours as needed for wheezing or shortness of breath.     ? calcium carbonate (TUMS - DOSED IN MG ELEMENTAL CALCIUM) 500 MG chewable tablet Chew 1 tablet by mouth every other day. Alternates between tums and omeprazole in the evening before supper    ? Calcium Carbonate-Vitamin D 600-400 MG-UNIT tablet Take 1 tablet by mouth every evening.    ? diltiazem (CARDIZEM) 30 MG tablet TAKE 1 TABLET BY MOUTH FOUR TIMES DAILY AS NEEDED 30 tablet 6  ? docusate sodium (COLACE) 100 MG capsule Take 100 mg by mouth 2 (two) times daily.     ? dorzolamide (TRUSOPT) 2 % ophthalmic solution Place 1 drop into both eyes 2 (two) times daily.     ? latanoprost (XALATAN) 0.005 % ophthalmic solution Place 1 drop into both eyes at bedtime.    ? lisinopril-hydrochlorothiazide (PRINZIDE,ZESTORETIC) 20-25 MG tablet Take 1 tablet by mouth at bedtime.     ? meclizine (ANTIVERT) 25 MG tablet Take 25 mg by mouth 3 (three) times daily as needed for dizziness.    ? Melatonin 3 MG CAPS Take 3 mg by mouth as needed (sleep). At bedtime for sleep    ? metoprolol tartrate (LOPRESSOR) 50 MG tablet TAKE 1 TABLET BY MOUTH TWICE DAILY 60 tablet 6  ? MULTAQ 400 MG tablet TAKE 1 TABLET BY MOUTH TWICE DAILY with a meal 60 tablet 6  ? omeprazole (PRILOSEC) 40 MG capsule Take 40 mg by mouth in the morning and at bedtime.    ? XARELTO 20 MG TABS tablet TAKE 1 TABLET BY MOUTH EVERY DAY WITH SUPPER 30 tablet 5  ? traZODone (DESYREL) 50 MG tablet Take by mouth.    ? ?No current facility-administered medications for this encounter.  ? ? ?Allergies  ?Allergen Reactions  ? Other   ?  CANNOT RECEIVE AN MRI DUE TO METAL BAND (RIGHT EYE) DUE  TO DETATCHED RETINA   ? ? ?Social History  ? ?Socioeconomic History  ? Marital status: Married  ?  Spouse name: [name]  ? Number of children: 1  ? Years of education: Not on file  ? Highest education level: Not on file  ?Occupational History  ? Occupation: [employer]  ?  Employer: OTHER  ?Tobacco Use  ? Smoking status: Former  ?  Packs/day: 1.00  ?  Years: 10.00  ?  Pack years: 10.00  ?  Types: Cigarettes  ?  Start date: 12/17/1966  ?  Quit date: 12/17/1976  ?  Years since quitting: 45.3  ? Smokeless tobacco: Never  ? Tobacco comments:  ?  Former smoker quit 21yr ago  ?Vaping Use  ? Vaping Use: Never used  ?  Substance and Sexual Activity  ? Alcohol use: No  ?  Alcohol/week: 0.0 standard drinks  ? Drug use: No  ? Sexual activity: Not on file  ?Other Topics Concern  ? Not on file  ?Social History Narrative  ? Pt gets regular exercise. 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 WORKED FOR [employer]. KIDS: [place], [place] WAS IN THE [employer].  ? ?Social Determinants of Health  ? ?Financial Resource Strain: Not on file  ?Food Insecurity: Not on file  ?Transportation Needs: Not on file  ?Physical Activity: Not on file  ?Stress: Not on file  ?Social Connections: Not on file  ?Intimate Partner Violence: Not on file  ? ? ? ?ROS- All systems are reviewed and negative except as per the HPI above. ? ?Physical Exam: ?Vitals:  ? 04/11/22 0952  ?BP: 118/76  ?Pulse: (!) 56  ?Weight: 77.1 kg  ?Height: '5\' 4"'$  (1.626 m)  ? ? ? ?GEN- The patient is a well appearing female, alert and oriented x 3 today.   ?HEENT-head normocephalic, atraumatic, sclera clear, conjunctiva pink, hearing intact, trachea midline. ?Lungs- Clear to ausculation bilaterally, normal work of breathing ?Heart- Regular rate and rhythm, no murmurs, rubs or gallops  ?GI- soft, NT, ND, + BS ?Extremities- no clubbing, cyanosis, or edema ?MS- no significant deformity or atrophy ?Skin- no rash or lesion ?Psych- euthymic mood,  full affect ?Neuro- strength and sensation are intact ? ? ?Wt Readings from Last 3 Encounters:  ?04/11/22 77.1 kg  ?11/01/21 76.8 kg  ?05/03/21 79.1 kg  ? ? ?EKG today demonstrates  ?SB, 1st degree AV block

## 2022-04-15 DIAGNOSIS — I700 Atherosclerosis of aorta: Secondary | ICD-10-CM | POA: Diagnosis not present

## 2022-04-15 DIAGNOSIS — S39012A Strain of muscle, fascia and tendon of lower back, initial encounter: Secondary | ICD-10-CM | POA: Diagnosis not present

## 2022-04-15 DIAGNOSIS — M5442 Lumbago with sciatica, left side: Secondary | ICD-10-CM | POA: Diagnosis not present

## 2022-04-15 DIAGNOSIS — K5730 Diverticulosis of large intestine without perforation or abscess without bleeding: Secondary | ICD-10-CM | POA: Diagnosis not present

## 2022-04-15 DIAGNOSIS — M5441 Lumbago with sciatica, right side: Secondary | ICD-10-CM | POA: Diagnosis not present

## 2022-04-15 DIAGNOSIS — X501XXA Overexertion from prolonged static or awkward postures, initial encounter: Secondary | ICD-10-CM | POA: Diagnosis not present

## 2022-04-15 DIAGNOSIS — K760 Fatty (change of) liver, not elsewhere classified: Secondary | ICD-10-CM | POA: Diagnosis not present

## 2022-04-15 DIAGNOSIS — M25552 Pain in left hip: Secondary | ICD-10-CM | POA: Diagnosis not present

## 2022-04-15 DIAGNOSIS — R109 Unspecified abdominal pain: Secondary | ICD-10-CM | POA: Diagnosis not present

## 2022-04-17 DIAGNOSIS — Z6828 Body mass index (BMI) 28.0-28.9, adult: Secondary | ICD-10-CM | POA: Diagnosis not present

## 2022-04-17 DIAGNOSIS — B029 Zoster without complications: Secondary | ICD-10-CM | POA: Diagnosis not present

## 2022-04-19 ENCOUNTER — Encounter: Payer: Self-pay | Admitting: *Deleted

## 2022-04-19 ENCOUNTER — Ambulatory Visit (INDEPENDENT_AMBULATORY_CARE_PROVIDER_SITE_OTHER): Payer: Medicare Other | Admitting: Cardiology

## 2022-04-19 ENCOUNTER — Encounter: Payer: Self-pay | Admitting: Cardiology

## 2022-04-19 VITALS — BP 124/82 | HR 65 | Ht 64.0 in | Wt 168.0 lb

## 2022-04-19 DIAGNOSIS — Z01812 Encounter for preprocedural laboratory examination: Secondary | ICD-10-CM | POA: Diagnosis not present

## 2022-04-19 DIAGNOSIS — I480 Paroxysmal atrial fibrillation: Secondary | ICD-10-CM | POA: Diagnosis not present

## 2022-04-19 NOTE — Patient Instructions (Signed)
Medication Instructions:  ?Your physician recommends that you continue on your current medications as directed. Please refer to the Current Medication list given to you today. ? ?*If you need a refill on your cardiac medications before your next appointment, please call your pharmacy* ? ? ?Lab Work: ?Pre procedure labs -- see procedure instructions:  BMP & CBC ? ?If you have labs (blood work) drawn today and your tests are completely normal, you will receive your results only by: ?MyChart Message (if you have MyChart) OR ?A paper copy in the mail ?If you have any lab test that is abnormal or we need to change your treatment, we will call you to review the results. ? ? ?Testing/Procedures: ?Your physician has requested that you have cardiac CT within 7 days PRIOR to your ablation. Cardiac computed tomography (CT) is a painless test that uses an x-ray machine to take clear, detailed pictures of your heart.  Please follow instruction below located under "other instructions". ?You will get a call from our office to schedule the date for this test. ? ?Your physician has recommended that you have an ablation. Catheter ablation is a medical procedure used to treat some cardiac arrhythmias (irregular heartbeats). During catheter ablation, a long, thin, flexible tube is put into a blood vessel in your groin (upper thigh), or neck. This tube is called an ablation catheter. It is then guided to your heart through the blood vessel. Radio frequency waves destroy small areas of heart tissue where abnormal heartbeats may cause an arrhythmia to start. Please follow instruction letter given to you today. ? ? ?Follow-Up: ?At [facility], you and your health needs are our priority.  As part of our continuing mission to provide you with exceptional heart care, we have created designated Provider Care Teams.  These Care Teams include your primary Cardiologist (physician) and Advanced Practice Providers (APPs -  Physician Assistants  and Nurse Practitioners) who all work together to provide you with the care you need, when you need it. ? ?Your next appointment:   ?1 month(s) after your ablation ? ?The format for your next appointment:   ?In Person ? ?Provider:   ?AFib clinic ? ? ?Thank you for choosing [facility]!! ? ? ?Trinidad Curet, RN ?([phone] ? ? ? ?Other Instructions         ?                ? ?Cardiac Ablation ?Cardiac ablation is a procedure to destroy (ablate) some heart tissue that is sending bad signals. These bad signals cause problems in heart rhythm. ?The heart has many areas that make these signals. If there are problems in these areas, they can make the heart beat in a way that is not normal. Destroying some tissues can help make the heart rhythm normal. ?Tell your doctor about: ?Any allergies you have. ?All medicines you are taking. These include vitamins, herbs, eye drops, creams, and over-the-counter medicines. ?Any problems you or family members have had with medicines that make you fall asleep (anesthetics). ?Any blood disorders you have. ?Any surgeries you have had. ?Any medical conditions you have, such as kidney failure. ?Whether you are pregnant or may be pregnant. ?What are the risks? ?This is a safe procedure. But problems may occur, including: ?Infection. ?Bruising and bleeding. ?Bleeding into the chest. ?Stroke or blood clots. ?Damage to nearby areas of your body. ?Allergies to medicines or dyes. ?The need for a pacemaker if the normal system is damaged. ?Failure of the  procedure to treat the problem. ?What happens before the procedure? ?Medicines ?Ask your doctor about: ?Changing or stopping your normal medicines. This is important. ?Taking aspirin and ibuprofen. Do not take these medicines unless your doctor tells you to take them. ?Taking other medicines, vitamins, herbs, and supplements. ?General instructions ?Follow instructions from your doctor about what you cannot eat or drink. ?Plan to have someone  take you home from the hospital or clinic. ?If you will be going home right after the procedure, plan to have someone with you for 24 hours. ?Ask your doctor what steps will be taken to prevent infection. ?What happens during the procedure? ? ?An IV tube will be put into one of your veins. ?You will be given a medicine to help you relax. ?The skin on your neck or groin will be numbed. ?A cut (incision) will be made in your neck or groin. A needle will be put through your cut and into a large vein. ?A tube (catheter) will be put into the needle. The tube will be moved to your heart. ?Dye may be put through the tube. This helps your doctor see your heart. ?Small devices (electrodes) on the tube will send out signals. ?A type of energy will be used to destroy some heart tissue. ?The tube will be taken out. ?Pressure will be held on your cut. This helps stop bleeding. ?A bandage will be put over your cut. ?The exact procedure may vary among doctors and hospitals. ?What happens after the procedure? ?You will be watched until you leave the hospital or clinic. This includes checking your heart rate, breathing rate, oxygen, and blood pressure. ?Your cut will be watched for bleeding. You will need to lie still for a few hours. ?Do not drive for 24 hours or as long as your doctor tells you. ?Summary ?Cardiac ablation is a procedure to destroy some heart tissue. This is done to treat heart rhythm problems. ?Tell your doctor about any medical conditions you may have. Tell him or her about all medicines you are taking to treat them. ?This is a safe procedure. But problems may occur. These include infection, bruising, bleeding, and damage to nearby areas of your body. ?Follow what your doctor tells you about food and drink. You may also be told to change or stop some of your medicines. ?After the procedure, do not drive for 24 hours or as long as your doctor tells you. ?This information is not intended to replace advice given to  you by your health care provider. Make sure you discuss any questions you have with your health care provider. ?Document Revised: 11/05/2019 Document Reviewed: 11/05/2019 ?Elsevier Patient Education ? [place]. ? ?

## 2022-04-19 NOTE — Progress Notes (Signed)
? ?Electrophysiology Office Note ? ? ?Date:  04/19/2022  ? ?ID:  Heather Cunningham, DOB 12-20-1950, MRN [id number] ? ?PCP:  Manon Hilding, MD  ?Cardiologist:   ?Primary Electrophysiologist:   Meredith Leeds, MD   ? ?Chief Complaint: AF ?  ?History of Present Illness: ?Heather Cunningham is a 71 y.o. female who is being seen today for the evaluation of AF at the request of [name], MD. Presenting today for electrophysiology evaluation. ? ?She has a history seen for paroxysmal atrial fibrillation and hypertension.  She has a Linq monitor implanted for A-fib management.  She has significant weakness and fatigue when she has atrial fibrillation.  Her husband was diagnosed with stage IV lung cancer, unfortunately he died last 30-Sep-2023.  She is still grieving from this.  She does not feel that she has had any atrial fibrillation since starting her Multaq. ? ?Today, she denies symptoms of palpitations, chest pain, shortness of breath, orthopnea, PND, lower extremity edema, claudication, dizziness, presyncope, syncope, bleeding, or neurologic sequela. The patient is tolerating medications without difficulties.  ? ? ?Past Medical History:  ?Diagnosis Date  ? Arthritis   ? back  ? Asthma   ? reactive airway around cats and dogs  ? Bilateral carpal tunnel syndrome   ? Cancer [facility])   ? Skin  ? GERD (gastroesophageal reflux disease)   ? Glaucoma   ? Hypertension   ? Paroxysmal atrial fibrillation (HCC)   ? PONV (postoperative nausea and vomiting)   ? Premature ventricular contraction   ? Status post placement of implantable loop recorder   ? ?Past Surgical History:  ?Procedure Laterality Date  ? BREAST EXCISIONAL BIOPSY Left 1996  ? BREAST EXCISIONAL BIOPSY Right 1993  ? BREAST LUMPECTOMY    ? CATARACT EXTRACTION    ? COLONOSCOPY N/A 09/05/2015  ? Procedure: COLONOSCOPY;  Surgeon: [name], MD;  [facility];  Service: Endoscopy;  Laterality: N/A;  incomplete colonoscopy  ? COLONOSCOPY WITH PROPOFOL N/A 11/01/2015  ?  UTM:[id number] hemorrhoids/five polyps/mild diverticulosis, tubular adenomas.  Next colonoscopy 3 years with propofol with overtube.  ? COLONOSCOPY WITH PROPOFOL N/A 11/02/2019  ? Procedure: COLONOSCOPY WITH PROPOFOL;  Surgeon: [name], MD;  [facility];  Service: Endoscopy;  Laterality: N/A;  2:00pm ?with overtube  ? EYE SURGERY    ? HYSTEROSCOPY WITH D & C N/A 12/31/2016  ? Procedure: DILATATION AND CURETTAGE /HYSTEROSCOPY, POSSIBLE REMOVAL OF ENDOMETRIAL LESION;  Surgeon: [name], MD;  Location: [facility];  Service: Gynecology;  Laterality: N/A;  ? implantable loop recorder placement  09/28/2019  ? Medtronic Reveal [place] model [id number] ([place] [id number]) implantable loop recorder implanted by Dr Rayann Heman in office   ? Maxiofacial    ? POLYPECTOMY  11/01/2015  ? Procedure: POLYPECTOMY;  Surgeon: [name], MD;  [facility];  Service: Endoscopy;;  ? POLYPECTOMY  11/02/2019  ? Procedure: POLYPECTOMY;  Surgeon: [name], MD;  [facility];  Service: Endoscopy;;  ? [place]  ? TUBAL LIGATION    ? ? ? ?Current Outpatient Medications  ?Medication Sig Dispense Refill  ? acetaminophen (TYLENOL) 500 MG tablet Take 500-1,000 mg by mouth every 6 (six) hours as needed for mild pain.     ? albuterol (VENTOLIN HFA) 108 (90 Base) MCG/ACT inhaler Inhale 2 puffs into the lungs every 6 (six) hours as needed for wheezing or shortness of breath.     ? calcium  carbonate (TUMS - DOSED IN MG ELEMENTAL CALCIUM) 500 MG chewable tablet Chew 1 tablet by mouth every other day. Alternates between tums and omeprazole in the evening before supper    ? Calcium Carbonate-Vitamin D 600-400 MG-UNIT tablet Take 1 tablet by mouth every evening.    ? diltiazem (CARDIZEM) 30 MG tablet TAKE 1 TABLET BY MOUTH FOUR TIMES DAILY AS NEEDED 30 tablet 6  ? docusate sodium (COLACE) 100 MG capsule Take 100 mg by mouth 2 (two) times daily.     ? dorzolamide (TRUSOPT) 2 % ophthalmic solution  Place 1 drop into both eyes 2 (two) times daily.     ? dronedarone (MULTAQ) 400 MG tablet Take 1 tablet (400 mg total) by mouth 2 (two) times daily with a meal. 36 tablet 0  ? latanoprost (XALATAN) 0.005 % ophthalmic solution Place 1 drop into both eyes at bedtime.    ? lisinopril-hydrochlorothiazide (PRINZIDE,ZESTORETIC) 20-25 MG tablet Take 1 tablet by mouth at bedtime.     ? meclizine (ANTIVERT) 25 MG tablet Take 25 mg by mouth 3 (three) times daily as needed for dizziness.    ? Melatonin 3 MG CAPS Take 3 mg by mouth as needed (sleep). At bedtime for sleep    ? metoprolol tartrate (LOPRESSOR) 50 MG tablet TAKE 1 TABLET BY MOUTH TWICE DAILY 60 tablet 6  ? omeprazole (PRILOSEC) 40 MG capsule Take 40 mg by mouth in the morning and at bedtime.    ? PREDNISONE PO Take by mouth as directed. Taper    ? PRESCRIPTION MEDICATION Take 1 tablet by mouth in the morning, at noon, and at bedtime. Antiviral medication for shingles- unsure of name    ? simvastatin (ZOCOR) 10 MG tablet Take 1 tablet (10 mg total) by mouth at bedtime. 30 tablet 0  ? traMADol (ULTRAM) 50 MG tablet Take 50 mg by mouth every 6 (six) hours as needed (pain).    ? traZODone (DESYREL) 50 MG tablet Take 50 mg by mouth at bedtime as needed for sleep.    ? XARELTO 20 MG TABS tablet TAKE 1 TABLET BY MOUTH EVERY DAY WITH SUPPER 30 tablet 5  ? ?No current facility-administered medications for this visit.  ? ? ?Allergies:   Other  ? ?Social History:  The patient  reports that she quit smoking about 45 years ago. Her smoking use included cigarettes. She started smoking about 55 years ago. She has a 10.00 pack-year smoking history. She has never used smokeless tobacco. She reports that she does not drink alcohol and does not use drugs.  ? ?Family History:  The patient's family history includes Cancer in an other family member; Colon cancer in her maternal grandmother and mother; Heart failure in an other family member.  ? ? ?ROS:  Please see the history of present  illness.   Otherwise, review of systems is positive for none.   All other systems are reviewed and negative.  ? ? ?PHYSICAL EXAM: ?VS:  BP 124/82   Pulse 65   Ht '5\' 4"'$  (1.626 m)   Wt 168 lb (76.2 kg)   SpO2 96%   BMI 28.84 kg/m?  , BMI Body mass index is 28.84 kg/m?. ?GEN: Well nourished, well developed, in no acute distress  ?HEENT: normal  ?Neck: no JVD, carotid bruits, or masses ?Cardiac: RRR; no murmurs, rubs, or gallops,no edema  ?Respiratory:  clear to auscultation bilaterally, normal work of breathing ?GI: soft, nontender, nondistended, + BS ?MS: no deformity or atrophy  ?Skin:  warm and dry, device pocket is well healed ?Neuro:  Strength and sensation are intact ?Psych: euthymic mood, full affect ? ?EKG:  EKG is not ordered today. ?Personal review of the ekg ordered 04/11/22 shows sinus rhythm, first-degree AV block, LVH ? ?Device interrogation is reviewed today in detail.  See PaceArt for details. ? ? ?Recent Labs: ?05/03/2021: ALT 24 ?04/11/2022: BUN 14; Creatinine, Ser 0.62; Hemoglobin 13.5; Platelets 300; Potassium 4.0; Sodium 136  ? ? ?Lipid Panel  ?No results found for: CHOL, TRIG, HDL, CHOLHDL, VLDL, LDLCALC, LDLDIRECT ? ? ?Wt Readings from Last 3 Encounters:  ?04/19/22 168 lb (76.2 kg)  ?04/11/22 170 lb (77.1 kg)  ?11/01/21 169 lb 6.4 oz (76.8 kg)  ?  ? ? ?Other studies Reviewed: ?Additional studies/ records that were reviewed today include: TTE 2016  ?Review of the above records today demonstrates:  ?- Left ventricle: The cavity size was normal. There was mild  ?  concentric hypertrophy. Systolic function was normal. The  ?  estimated ejection fraction was in the range of 60% to 65%. Wall  ?  motion was normal; there were no regional wall motion  ?  abnormalities. Left ventricular diastolic function parameters  ?  were normal.  ?- Aortic valve: Trileaflet; normal thickness leaflets. There was no  ?  regurgitation.  ?- Aortic root: The aortic root was normal in size.  ?- Ascending aorta: The  ascending aorta was normal in size.  ?- Mitral valve: Structurally normal valve. There was no  ?  regurgitation.  ?- Left atrium: The atrium was normal in size.  ?- Right ventricle: The cavity size was normal. Wall t

## 2022-04-23 ENCOUNTER — Ambulatory Visit (INDEPENDENT_AMBULATORY_CARE_PROVIDER_SITE_OTHER): Payer: Medicare Other

## 2022-04-23 ENCOUNTER — Telehealth: Payer: Self-pay | Admitting: Cardiology

## 2022-04-23 DIAGNOSIS — R002 Palpitations: Secondary | ICD-10-CM

## 2022-04-23 NOTE — Telephone Encounter (Signed)
Patient calling to speak with Sherri. She says she got her CT appointment 8/11 at 1:30 pm and wants to make sure that is okay with when she has her ablation 8/18. She states it had to be within a certain time period. ?

## 2022-04-23 NOTE — Telephone Encounter (Signed)
Pt informed that scheduled CT on 8/11 is scheduled appropriately for her ablation on 8/18. ?Pt just making sure and thanks me for return call. ?

## 2022-04-24 LAB — CUP PACEART REMOTE DEVICE CHECK
Date Time Interrogation Session: 20230509094527
Implantable Pulse Generator Implant Date: 20201012

## 2022-05-02 ENCOUNTER — Ambulatory Visit (HOSPITAL_COMMUNITY): Payer: Medicare Other | Admitting: Physician Assistant

## 2022-05-15 NOTE — Progress Notes (Signed)
Carelink Summary Report / Loop Recorder 

## 2022-05-21 DIAGNOSIS — H401121 Primary open-angle glaucoma, left eye, mild stage: Secondary | ICD-10-CM | POA: Diagnosis not present

## 2022-05-21 DIAGNOSIS — H401111 Primary open-angle glaucoma, right eye, mild stage: Secondary | ICD-10-CM | POA: Diagnosis not present

## 2022-05-22 DIAGNOSIS — I10 Essential (primary) hypertension: Secondary | ICD-10-CM | POA: Diagnosis not present

## 2022-05-22 DIAGNOSIS — Z6829 Body mass index (BMI) 29.0-29.9, adult: Secondary | ICD-10-CM | POA: Diagnosis not present

## 2022-05-22 DIAGNOSIS — E782 Mixed hyperlipidemia: Secondary | ICD-10-CM | POA: Diagnosis not present

## 2022-05-22 DIAGNOSIS — M810 Age-related osteoporosis without current pathological fracture: Secondary | ICD-10-CM | POA: Diagnosis not present

## 2022-05-22 DIAGNOSIS — K219 Gastro-esophageal reflux disease without esophagitis: Secondary | ICD-10-CM | POA: Diagnosis not present

## 2022-05-22 DIAGNOSIS — Z0001 Encounter for general adult medical examination with abnormal findings: Secondary | ICD-10-CM | POA: Diagnosis not present

## 2022-05-22 DIAGNOSIS — M8580 Other specified disorders of bone density and structure, unspecified site: Secondary | ICD-10-CM | POA: Diagnosis not present

## 2022-05-22 DIAGNOSIS — I480 Paroxysmal atrial fibrillation: Secondary | ICD-10-CM | POA: Diagnosis not present

## 2022-05-22 DIAGNOSIS — R739 Hyperglycemia, unspecified: Secondary | ICD-10-CM | POA: Diagnosis not present

## 2022-05-22 DIAGNOSIS — E7849 Other hyperlipidemia: Secondary | ICD-10-CM | POA: Diagnosis not present

## 2022-05-24 ENCOUNTER — Ambulatory Visit (INDEPENDENT_AMBULATORY_CARE_PROVIDER_SITE_OTHER): Payer: Medicare Other

## 2022-05-24 DIAGNOSIS — I480 Paroxysmal atrial fibrillation: Secondary | ICD-10-CM

## 2022-05-25 ENCOUNTER — Encounter: Payer: Self-pay | Admitting: Cardiology

## 2022-05-25 NOTE — Telephone Encounter (Signed)
error 

## 2022-05-28 ENCOUNTER — Telehealth: Payer: Self-pay

## 2022-05-28 DIAGNOSIS — I480 Paroxysmal atrial fibrillation: Secondary | ICD-10-CM | POA: Diagnosis not present

## 2022-05-28 DIAGNOSIS — R5383 Other fatigue: Secondary | ICD-10-CM | POA: Diagnosis not present

## 2022-05-28 DIAGNOSIS — R7301 Impaired fasting glucose: Secondary | ICD-10-CM | POA: Diagnosis not present

## 2022-05-28 DIAGNOSIS — G4700 Insomnia, unspecified: Secondary | ICD-10-CM | POA: Diagnosis not present

## 2022-05-28 DIAGNOSIS — I10 Essential (primary) hypertension: Secondary | ICD-10-CM | POA: Diagnosis not present

## 2022-05-28 DIAGNOSIS — K219 Gastro-esophageal reflux disease without esophagitis: Secondary | ICD-10-CM | POA: Diagnosis not present

## 2022-05-28 DIAGNOSIS — E7849 Other hyperlipidemia: Secondary | ICD-10-CM | POA: Diagnosis not present

## 2022-05-28 DIAGNOSIS — Z6829 Body mass index (BMI) 29.0-29.9, adult: Secondary | ICD-10-CM | POA: Diagnosis not present

## 2022-05-28 DIAGNOSIS — R42 Dizziness and giddiness: Secondary | ICD-10-CM | POA: Diagnosis not present

## 2022-05-28 NOTE — Telephone Encounter (Signed)
LMOVM letting patient know her monitor is now working.

## 2022-05-29 LAB — CUP PACEART REMOTE DEVICE CHECK
Date Time Interrogation Session: 20230612133811
Implantable Pulse Generator Implant Date: 20201012

## 2022-06-01 NOTE — Progress Notes (Signed)
Carelink Summary Report / Loop Recorder 

## 2022-06-25 ENCOUNTER — Ambulatory Visit (INDEPENDENT_AMBULATORY_CARE_PROVIDER_SITE_OTHER): Payer: Medicare Other

## 2022-06-25 DIAGNOSIS — I480 Paroxysmal atrial fibrillation: Secondary | ICD-10-CM | POA: Diagnosis not present

## 2022-06-29 LAB — CUP PACEART REMOTE DEVICE CHECK
Date Time Interrogation Session: 20230714123025
Implantable Pulse Generator Implant Date: 20201012

## 2022-07-05 ENCOUNTER — Telehealth: Payer: Self-pay

## 2022-07-05 NOTE — Telephone Encounter (Signed)
Patient called and reports of feeling fatigue during is symptom event. Reports compliance with medications. Advised I will forward to Dr. Curt Bears for review.

## 2022-07-10 DIAGNOSIS — Z1283 Encounter for screening for malignant neoplasm of skin: Secondary | ICD-10-CM | POA: Diagnosis not present

## 2022-07-10 DIAGNOSIS — L570 Actinic keratosis: Secondary | ICD-10-CM | POA: Diagnosis not present

## 2022-07-10 DIAGNOSIS — Z85828 Personal history of other malignant neoplasm of skin: Secondary | ICD-10-CM | POA: Diagnosis not present

## 2022-07-20 ENCOUNTER — Other Ambulatory Visit: Payer: Medicare Other

## 2022-07-20 DIAGNOSIS — Z01812 Encounter for preprocedural laboratory examination: Secondary | ICD-10-CM | POA: Diagnosis not present

## 2022-07-20 DIAGNOSIS — I480 Paroxysmal atrial fibrillation: Secondary | ICD-10-CM | POA: Diagnosis not present

## 2022-07-21 LAB — CBC
Hematocrit: 35.9 % (ref 34.0–46.6)
Hemoglobin: 12.6 g/dL (ref 11.1–15.9)
MCH: 31.7 pg (ref 26.6–33.0)
MCHC: 35.1 g/dL (ref 31.5–35.7)
MCV: 90 fL (ref 79–97)
Platelets: 274 10*3/uL (ref 150–450)
RBC: 3.98 x10E6/uL (ref 3.77–5.28)
RDW: 12.8 % (ref 11.7–15.4)
WBC: 8.5 10*3/uL (ref 3.4–10.8)

## 2022-07-21 LAB — BASIC METABOLIC PANEL
BUN/Creatinine Ratio: 25 (ref 12–28)
BUN: 17 mg/dL (ref 8–27)
CO2: 24 mmol/L (ref 20–29)
Calcium: 9.5 mg/dL (ref 8.7–10.3)
Chloride: 99 mmol/L (ref 96–106)
Creatinine, Ser: 0.69 mg/dL (ref 0.57–1.00)
Glucose: 106 mg/dL — ABNORMAL HIGH (ref 70–99)
Potassium: 4 mmol/L (ref 3.5–5.2)
Sodium: 137 mmol/L (ref 134–144)
eGFR: 92 mL/min/{1.73_m2} (ref >59)

## 2022-07-24 NOTE — Progress Notes (Signed)
Carelink Summary Report / Loop Recorder 

## 2022-07-26 ENCOUNTER — Telehealth (HOSPITAL_COMMUNITY): Payer: Self-pay | Admitting: *Deleted

## 2022-07-26 NOTE — Telephone Encounter (Signed)
Reaching out to patient to offer assistance regarding upcoming cardiac imaging study; pt verbalizes understanding of appt date/time, parking situation and where to check in, pre-test NPO status  and verified current allergies; name and call back number provided for further questions should they arise    RN Navigator Cardiac Imaging [facility] [phone] office [phone] cell  Patient aware to arrive at 1pm. 

## 2022-07-27 ENCOUNTER — Ambulatory Visit (HOSPITAL_COMMUNITY)
Admission: RE | Admit: 2022-07-27 | Discharge: 2022-07-27 | Disposition: A | Discharge status: Home / Self Care | Payer: Medicare Other | Source: Ambulatory Visit | Attending: Cardiology | Admitting: Cardiology

## 2022-07-27 DIAGNOSIS — I480 Paroxysmal atrial fibrillation: Secondary | ICD-10-CM | POA: Diagnosis not present

## 2022-07-27 MED ORDER — IOHEXOL 350 MG/ML SOLN
100.0000 mL | Freq: Once | INTRAVENOUS | Status: AC | PRN
Start: 2022-07-27 — End: 2022-07-27
  Administered 2022-07-27: 100 mL via INTRAVENOUS

## 2022-07-30 ENCOUNTER — Telehealth: Payer: Self-pay | Admitting: Cardiology

## 2022-07-30 NOTE — Telephone Encounter (Signed)
Pt calling about CT result, noticing it mentions she has a small PFO. She would like to know if ok to proceed with ablation scheduled for Friday or if this is something she should be concerned about. Aware Dr. Curt Bears has not reviewed the CT result yet, but will have him review it today. Aware I will call her back today/tomorrow with his finding/recommendation. Patient verbalized understanding and agreeable to plan.

## 2022-07-30 NOTE — Telephone Encounter (Signed)
Pt is requesting a call back to go over questions she has in regards to her upcoming procedure on 08/18.

## 2022-07-31 NOTE — Telephone Encounter (Signed)
Pt calling back for an update  

## 2022-07-31 NOTE — Telephone Encounter (Signed)
Returned pt call. Informed ok to proceed with planned procedure this week. Patient verbalized understanding and agreeable to plan.

## 2022-08-01 ENCOUNTER — Ambulatory Visit (INDEPENDENT_AMBULATORY_CARE_PROVIDER_SITE_OTHER): Payer: Medicare Other

## 2022-08-01 DIAGNOSIS — I480 Paroxysmal atrial fibrillation: Secondary | ICD-10-CM

## 2022-08-01 LAB — CUP PACEART REMOTE DEVICE CHECK
Date Time Interrogation Session: 20230816153508
Implantable Pulse Generator Implant Date: 20201012

## 2022-08-02 ENCOUNTER — Encounter (HOSPITAL_COMMUNITY): Payer: Self-pay | Admitting: Anesthesiology

## 2022-08-02 NOTE — Anesthesia Preprocedure Evaluation (Deleted)
Anesthesia Evaluation    Reviewed: Allergy & Precautions, Patient's Chart, lab work & pertinent test results  History of Anesthesia Complications (+) PONV and history of anesthetic complications  Airway        Dental   Pulmonary asthma , former smoker,           Cardiovascular hypertension, Pt. on medications and Pt. on home beta blockers + CAD  + dysrhythmias Atrial Fibrillation + pacemaker   2020  Narrative & Impression ? There was no ST segment deviation noted during stress. ? The study is normal. No ischemia or scar. ? This is a low risk study. ? Nuclear stress EF: 64%.     Neuro/Psych negative neurological ROS     GI/Hepatic Neg liver ROS, GERD  ,  Endo/Other  negative endocrine ROS  Renal/GU negative Renal ROS     Musculoskeletal negative musculoskeletal ROS (+)   Abdominal   Peds  Hematology negative hematology ROS (+)   Anesthesia Other Findings   Reproductive/Obstetrics                             Anesthesia Physical Anesthesia Plan  ASA: 3  Anesthesia Plan: General   Post-op Pain Management: Minimal or no pain anticipated   Induction: Intravenous  PONV Risk Score and Plan: 4 or greater and Ondansetron, Dexamethasone, Diphenhydramine and Treatment may vary due to age or medical condition  Airway Management Planned: Oral ETT  Additional Equipment:   Intra-op Plan:   Post-operative Plan: Extubation in OR  Informed Consent:   Plan Discussed with: Anesthesiologist  Anesthesia Plan Comments:         Anesthesia Quick Evaluation

## 2022-08-03 ENCOUNTER — Ambulatory Visit (HOSPITAL_COMMUNITY): Admission: RE | Admit: 2022-08-03 | Payer: Medicare Other | Source: Ambulatory Visit | Admitting: Cardiology

## 2022-08-03 ENCOUNTER — Encounter (HOSPITAL_COMMUNITY): Admission: RE | Payer: Medicare Other | Source: Ambulatory Visit

## 2022-08-03 ENCOUNTER — Telehealth: Payer: Self-pay | Admitting: Cardiology

## 2022-08-03 SURGERY — ATRIAL FIBRILLATION ABLATION
Anesthesia: General

## 2022-08-03 NOTE — Telephone Encounter (Signed)
Patient states her surgery this morning was cancelled. She says she was told to be at the hospital at 8:30 am, but they called her at 6 am wondering where she was. She say they told her she was supposed to be there at 5:30 am, but her paperwork says 8:30 am. She says her son has come all the way from [place] and rented a car to be with her for the surgery. She would like to have it rescheduled for Monday.

## 2022-08-03 NOTE — Telephone Encounter (Signed)
Returned call to pt and sincerely apologized for the mistake with procedure time. Pt rescheduled for ablation next Thursday, 8/24 per Dr. Curt Bears and Reino Bellis in the cath lab. Pt advised all instructions for procedure remain the same, but to arrive at 11:30 am on 8/24 for procedure. Patient verbalized understanding and agreeable to plan.

## 2022-08-08 NOTE — Pre-Procedure Instructions (Signed)
Instructed patient on the following items: Arrival time 1130 Nothing to eat or drink after midnight No meds AM of procedure Responsible person to drive you home and stay with you for 24 hrs  Have you missed any doses of anti-coagulant Xarelto- hasn't missed any doses

## 2022-08-09 ENCOUNTER — Ambulatory Visit (HOSPITAL_COMMUNITY)
Admission: RE | Admit: 2022-08-09 | Discharge: 2022-08-09 | Disposition: A | Discharge status: Home / Self Care | Payer: Medicare Other | Source: Ambulatory Visit | Attending: Cardiology | Admitting: Cardiology

## 2022-08-09 ENCOUNTER — Ambulatory Visit (HOSPITAL_COMMUNITY): Payer: Medicare Other | Admitting: Certified Registered Nurse Anesthetist

## 2022-08-09 ENCOUNTER — Ambulatory Visit (HOSPITAL_BASED_OUTPATIENT_CLINIC_OR_DEPARTMENT_OTHER): Payer: Medicare Other | Admitting: Certified Registered Nurse Anesthetist

## 2022-08-09 ENCOUNTER — Encounter (HOSPITAL_COMMUNITY)
Admission: RE | Disposition: A | Discharge status: Home / Self Care | Payer: Self-pay | Source: Ambulatory Visit | Attending: Cardiology

## 2022-08-09 ENCOUNTER — Other Ambulatory Visit: Payer: Self-pay

## 2022-08-09 DIAGNOSIS — I10 Essential (primary) hypertension: Secondary | ICD-10-CM | POA: Insufficient documentation

## 2022-08-09 DIAGNOSIS — I4891 Unspecified atrial fibrillation: Secondary | ICD-10-CM | POA: Diagnosis not present

## 2022-08-09 DIAGNOSIS — I2510 Atherosclerotic heart disease of native coronary artery without angina pectoris: Secondary | ICD-10-CM | POA: Diagnosis not present

## 2022-08-09 DIAGNOSIS — Z87891 Personal history of nicotine dependence: Secondary | ICD-10-CM | POA: Insufficient documentation

## 2022-08-09 DIAGNOSIS — I480 Paroxysmal atrial fibrillation: Secondary | ICD-10-CM | POA: Diagnosis not present

## 2022-08-09 HISTORY — PX: ATRIAL FIBRILLATION ABLATION: EP1191

## 2022-08-09 LAB — POCT ACTIVATED CLOTTING TIME
Activated Clotting Time: 305 seconds
Activated Clotting Time: 365 seconds

## 2022-08-09 SURGERY — ATRIAL FIBRILLATION ABLATION
Anesthesia: General

## 2022-08-09 MED ORDER — ROCURONIUM BROMIDE 10 MG/ML (PF) SYRINGE
PREFILLED_SYRINGE | INTRAVENOUS | Status: DC | PRN
  Administered 2022-08-09: 50 mg via INTRAVENOUS

## 2022-08-09 MED ORDER — SODIUM CHLORIDE 0.9% FLUSH: 3.0000 mL | Freq: Two times a day (BID) | INTRAVENOUS | Status: DC

## 2022-08-09 MED ORDER — DOBUTAMINE INFUSION FOR EP/ECHO/NUC (1000 MCG/ML)
INTRAVENOUS | Status: AC
  Filled 2022-08-09: qty 250

## 2022-08-09 MED ORDER — LIDOCAINE 2% (20 MG/ML) 5 ML SYRINGE
INTRAMUSCULAR | Status: DC | PRN
  Administered 2022-08-09: 60 mg via INTRAVENOUS

## 2022-08-09 MED ORDER — DEXAMETHASONE SODIUM PHOSPHATE 10 MG/ML IJ SOLN
INTRAMUSCULAR | Status: DC | PRN
  Administered 2022-08-09: 10 mg via INTRAVENOUS

## 2022-08-09 MED ORDER — ONDANSETRON HCL 4 MG/2ML IJ SOLN
INTRAMUSCULAR | Status: DC | PRN
  Administered 2022-08-09: 4 mg via INTRAVENOUS

## 2022-08-09 MED ORDER — HEPARIN SODIUM (PORCINE) 1000 UNIT/ML IJ SOLN
INTRAMUSCULAR | Status: DC | PRN
  Administered 2022-08-09: 1000 [IU] via INTRAVENOUS

## 2022-08-09 MED ORDER — PROTAMINE SULFATE 10 MG/ML IV SOLN
INTRAVENOUS | Status: DC | PRN
  Administered 2022-08-09: 40 mg via INTRAVENOUS

## 2022-08-09 MED ORDER — SODIUM CHLORIDE 0.9 % IV SOLN: INTRAVENOUS | Status: DC

## 2022-08-09 MED ORDER — PHENYLEPHRINE HCL-NACL 20-0.9 MG/250ML-% IV SOLN
INTRAVENOUS | Status: DC | PRN
  Administered 2022-08-09: 20 ug/min via INTRAVENOUS

## 2022-08-09 MED ORDER — HEPARIN (PORCINE) IN NACL 1000-0.9 UT/500ML-% IV SOLN
INTRAVENOUS | Status: AC
  Filled 2022-08-09: qty 500

## 2022-08-09 MED ORDER — ACETAMINOPHEN 325 MG PO TABS
650.0000 mg | ORAL_TABLET | ORAL | Status: DC | PRN
Start: 2022-08-09 — End: 2022-08-10

## 2022-08-09 MED ORDER — ACETAMINOPHEN 500 MG PO TABS
1000.0000 mg | ORAL_TABLET | Freq: Once | ORAL | Status: AC
  Administered 2022-08-09: 1000 mg via ORAL
  Filled 2022-08-09: qty 2

## 2022-08-09 MED ORDER — DOBUTAMINE INFUSION FOR EP/ECHO/NUC (1000 MCG/ML)
INTRAVENOUS | Status: DC | PRN
  Administered 2022-08-09: 20 ug/kg/min via INTRAVENOUS

## 2022-08-09 MED ORDER — FENTANYL CITRATE (PF) 100 MCG/2ML IJ SOLN
INTRAMUSCULAR | Status: DC | PRN
  Administered 2022-08-09: 50 ug via INTRAVENOUS

## 2022-08-09 MED ORDER — SODIUM CHLORIDE 0.9% FLUSH: 3.0000 mL | INTRAVENOUS | Status: DC | PRN

## 2022-08-09 MED ORDER — HEPARIN SODIUM (PORCINE) 1000 UNIT/ML IJ SOLN
INTRAMUSCULAR | Status: AC
  Filled 2022-08-09: qty 10

## 2022-08-09 MED ORDER — SUGAMMADEX SODIUM 200 MG/2ML IV SOLN
INTRAVENOUS | Status: DC | PRN
  Administered 2022-08-09: 200 mg via INTRAVENOUS

## 2022-08-09 MED ORDER — ONDANSETRON HCL 4 MG/2ML IJ SOLN: 4.0000 mg | Freq: Four times a day (QID) | INTRAMUSCULAR | Status: DC | PRN

## 2022-08-09 MED ORDER — SUCCINYLCHOLINE CHLORIDE 200 MG/10ML IV SOSY
PREFILLED_SYRINGE | INTRAVENOUS | Status: DC | PRN
  Administered 2022-08-09: 120 mg via INTRAVENOUS

## 2022-08-09 MED ORDER — PROPOFOL 10 MG/ML IV BOLUS
INTRAVENOUS | Status: DC | PRN
  Administered 2022-08-09: 150 mg via INTRAVENOUS

## 2022-08-09 MED ORDER — SODIUM CHLORIDE 0.9 % IV SOLN: 250.0000 mL | INTRAVENOUS | Status: DC | PRN

## 2022-08-09 MED ORDER — HEPARIN (PORCINE) IN NACL 1000-0.9 UT/500ML-% IV SOLN
INTRAVENOUS | Status: DC | PRN
  Administered 2022-08-09 (×4): 500 mL

## 2022-08-09 MED ORDER — PHENYLEPHRINE 80 MCG/ML (10ML) SYRINGE FOR IV PUSH (FOR BLOOD PRESSURE SUPPORT)
PREFILLED_SYRINGE | INTRAVENOUS | Status: DC | PRN
  Administered 2022-08-09: 160 ug via INTRAVENOUS

## 2022-08-09 MED ORDER — HEPARIN SODIUM (PORCINE) 1000 UNIT/ML IJ SOLN
INTRAMUSCULAR | Status: DC | PRN
  Administered 2022-08-09: 14000 [IU] via INTRAVENOUS

## 2022-08-09 SURGICAL SUPPLY — 21 items
BAG SNAP BAND KOVER 36X36 (MISCELLANEOUS) IMPLANT
BLANKET WARM UNDERBOD FULL ACC (MISCELLANEOUS) ×1 IMPLANT
CATH 8FR REPROCESSED SOUNDSTAR (CATHETERS) ×1 IMPLANT
CATH 8FR SOUNDSTAR REPROCESSED (CATHETERS) IMPLANT
CATH ABLAT QDOT MICRO BI TC DF (CATHETERS) IMPLANT
CATH OCTARAY 2.0 F 3-3-3-3-3 (CATHETERS) IMPLANT
CATH PIGTAIL STEERABLE D1 8.7 (WIRE) IMPLANT
CATH S-M CIRCA TEMP PROBE (CATHETERS) IMPLANT
CATH WEB BI DIR CSDF CRV REPRO (CATHETERS) IMPLANT
CLOSURE PERCLOSE PROSTYLE (VASCULAR PRODUCTS) IMPLANT
COVER SWIFTLINK CONNECTOR (BAG) ×1 IMPLANT
PACK EP LATEX FREE (CUSTOM PROCEDURE TRAY) ×1
PACK EP LF (CUSTOM PROCEDURE TRAY) ×1 IMPLANT
PAD DEFIB RADIO PHYSIO CONN (PAD) ×1 IMPLANT
PATCH CARTO3 (PAD) IMPLANT
SHEATH CARTO VIZIGO SM CVD (SHEATH) IMPLANT
SHEATH PINNACLE 7F 10CM (SHEATH) IMPLANT
SHEATH PINNACLE 8F 10CM (SHEATH) IMPLANT
SHEATH PINNACLE 9F 10CM (SHEATH) IMPLANT
SHEATH PROBE COVER 6X72 (BAG) IMPLANT
TUBING SMART ABLATE COOLFLOW (TUBING) IMPLANT

## 2022-08-09 NOTE — Transfer of Care (Signed)
Immediate Anesthesia Transfer of Care Note  Patient: Heather Cunningham  Procedure(s) Performed: ATRIAL FIBRILLATION ABLATION  Patient Location: Cath Lab  Anesthesia Type:General  Level of Consciousness: awake and alert   Airway & Oxygen Therapy: Patient Spontanous Breathing and Patient connected to nasal cannula oxygen  Post-op Assessment: Report given to RN and Post -op Vital signs reviewed and stable  Post vital signs: Reviewed and stable  Last Vitals:  Vitals Value Taken Time  BP 144/59 08/09/22 1620  Temp    Pulse 64 08/09/22 1623  Resp 20 08/09/22 1623  SpO2 97 % 08/09/22 1623  Vitals shown include unvalidated device data.  Last Pain:  Vitals:   08/09/22 1144  TempSrc:   PainSc: 0-No pain         Complications:  Encounter Notable Events  Notable Event Outcome Phase Comment  Difficult to intubate - expected  Intraprocedure Filed from anesthesia note documentation.

## 2022-08-09 NOTE — Discharge Instructions (Signed)

## 2022-08-09 NOTE — Anesthesia Postprocedure Evaluation (Signed)
Anesthesia Post Note  Patient: Heather Cunningham  Procedure(s) Performed: ATRIAL FIBRILLATION ABLATION     Patient location during evaluation: PACU Anesthesia Type: General Level of consciousness: awake Pain management: pain level controlled Vital Signs Assessment: post-procedure vital signs reviewed and stable Respiratory status: spontaneous breathing Cardiovascular status: stable Postop Assessment: no apparent nausea or vomiting Anesthetic complications: yes   Encounter Notable Events  Notable Event Outcome Phase Comment  Difficult to intubate - expected  Intraprocedure Filed from anesthesia note documentation.    Last Vitals:  Vitals:   08/09/22 1628 08/09/22 1645  BP:  (!) 137/57  Pulse: 64 66  Resp: 19 16  Temp:    SpO2: 93% 91%    Last Pain:  Vitals:   08/09/22 1645  TempSrc:   PainSc: 3                   

## 2022-08-09 NOTE — H&P (Signed)
Electrophysiology Office Note   Date:  08/09/2022   ID:  Heather Cunningham, Heather Cunningham 1951-10-21, MRN [id number]  PCP:  Manon Hilding, MD  Cardiologist:   Primary Electrophysiologist:   Meredith Leeds, MD    Chief Complaint: AF   History of Present Illness: Heather Cunningham is a 71 y.o. female who is being seen today for the evaluation of AF at the request of No ref. provider found. Presenting today for electrophysiology evaluation.  She has a history seen for paroxysmal atrial fibrillation and hypertension.  She has a Linq monitor implanted for A-fib management.  She has significant weakness and fatigue when she has atrial fibrillation.  Her husband was diagnosed with stage IV lung cancer, unfortunately he died last 09/01/23.  She is still grieving from this.  She does not feel that she has had any atrial fibrillation since starting her Multaq.  Today, denies symptoms of palpitations, chest pain, shortness of breath, orthopnea, PND, lower extremity edema, claudication, dizziness, presyncope, syncope, bleeding, or neurologic sequela. The patient is tolerating medications without difficulties. Plan for AF ablation today.    Past Medical History:  Diagnosis Date   Arthritis    back   Asthma    reactive airway around cats and dogs   Bilateral carpal tunnel syndrome    Cancer (HCC)    Skin   GERD (gastroesophageal reflux disease)    Glaucoma    Hypertension    Paroxysmal atrial fibrillation (HCC)    PONV (postoperative nausea and vomiting)    Premature ventricular contraction    Status post placement of implantable loop recorder    Past Surgical History:  Procedure Laterality Date   BREAST EXCISIONAL BIOPSY Left 1996   BREAST EXCISIONAL BIOPSY Right 1993   BREAST LUMPECTOMY     CATARACT EXTRACTION     COLONOSCOPY N/A 09/05/2015   Procedure: COLONOSCOPY;  Surgeon: [name], MD;  Location: [facility];  Service: Endoscopy;  Laterality: N/A;  incomplete colonoscopy   COLONOSCOPY  WITH PROPOFOL N/A 11/01/2015   FAO:ZHYQMVHQ hemorrhoids/five polyps/mild diverticulosis, tubular adenomas.  Next colonoscopy 3 years with propofol with overtube.   COLONOSCOPY WITH PROPOFOL N/A 11/02/2019   Procedure: COLONOSCOPY WITH PROPOFOL;  Surgeon: [name], MD;  Location: [facility];  Service: Endoscopy;  Laterality: N/A;  2:00pm with overtube   EYE SURGERY     HYSTEROSCOPY WITH D & C N/A 12/31/2016   Procedure: DILATATION AND CURETTAGE /HYSTEROSCOPY, POSSIBLE REMOVAL OF ENDOMETRIAL LESION;  Surgeon: [name], MD;  Location: [facility];  Service: Gynecology;  Laterality: N/A;   implantable loop recorder placement  09/28/2019   [employer] Reveal Brandon model LNQ22 ([place] [id number]) implantable loop recorder implanted by Dr Rayann Heman in office    Maxiofacial     POLYPECTOMY  11/01/2015   Procedure: POLYPECTOMY;  Surgeon: [name], MD;  Location: [facility];  Service: Endoscopy;;   POLYPECTOMY  11/02/2019   Procedure: POLYPECTOMY;  Surgeon: [name], MD;  Location: [facility];  Service: Endoscopy;;   RETINAL DETACHMENT SURGERY  1997  1999   TUBAL LIGATION       Current Facility-Administered Medications  Medication Dose Route Frequency Provider Last Rate Last Admin   0.9 %  sodium chloride infusion   Intravenous Continuous Constance Haw, MD 50 mL/hr at 08/09/22 1148 New Bag at 08/09/22 1148    Allergies:   Augmentin [amoxicillin-pot clavulanate], Ciprofloxacin, and Other   Social History:  The patient  reports that  she quit smoking about 45 years ago. Her smoking use included cigarettes. She started smoking about 55 years ago. She has a 10.00 pack-year smoking history. She has never used smokeless tobacco. She reports that she does not drink alcohol and does not use drugs.   Family History:  The patient's family history includes Cancer in an other family member; Colon cancer in her maternal grandmother and mother; Heart failure in an other family member.   ROS:   Please see the history of present illness.   Otherwise, review of systems is positive for none.   All other systems are reviewed and negative.   PHYSICAL EXAM: VS:  BP 126/70   Pulse 63   Temp (!) 97.3 F (36.3 C) (Temporal)   Resp 18   Ht '5\' 4"'$  (1.626 m)   Wt 76.7 kg   SpO2 96%   BMI 29.01 kg/m  , BMI Body mass index is 29.01 kg/m. GEN: Well nourished, well developed, in no acute distress  HEENT: normal  Neck: no JVD, carotid bruits, or masses Cardiac: RRR; no murmurs, rubs, or gallops,no edema  Respiratory:  clear to auscultation bilaterally, normal work of breathing GI: soft, nontender, nondistended, + BS MS: no deformity or atrophy  Skin: warm and dry, device site well healed Neuro:  Strength and sensation are intact Psych: euthymic mood, full affect    Recent Labs: 07/20/2022: BUN 17; Creatinine, Ser 0.69; Hemoglobin 12.6; Platelets 274; Potassium 4.0; Sodium 137    Lipid Panel  No results found for: "CHOL", "TRIG", "HDL", "CHOLHDL", "VLDL", "LDLCALC", "LDLDIRECT"   Wt Readings from Last 3 Encounters:  08/09/22 76.7 kg  04/19/22 76.2 kg  04/11/22 77.1 kg      Other studies Reviewed: Additional studies/ records that were reviewed today include: TTE 2016  Review of the above records today demonstrates:  - Left ventricle: The cavity size was normal. There was mild    concentric hypertrophy. Systolic function was normal. The    estimated ejection fraction was in the range of 60% to 65%. Wall    motion was normal; there were no regional wall motion    abnormalities. Left ventricular diastolic function parameters    were normal.  - Aortic valve: Trileaflet; normal thickness leaflets. There was no    regurgitation.  - Aortic root: The aortic root was normal in size.  - Ascending aorta: The ascending aorta was normal in size.  - Mitral valve: Structurally normal valve. There was no    regurgitation.  - Left atrium: The atrium was normal in size.  - Right ventricle:  The cavity size was normal. Wall thickness was    normal. Systolic function was normal.  - Right atrium: The atrium was normal in size.  - Tricuspid valve: There was trivial regurgitation.  - Pulmonic valve: There was no regurgitation.  - Pulmonary arteries: Systolic pressure was within the normal    range.  - Inferior vena cava: The vessel was normal in size.  - Pericardium, extracardiac: There was no pericardial effusion.    ASSESSMENT AND PLAN:  1.  Paroxysmal atrial fibrillation: Heather Cunningham has presented today for surgery, with the diagnosis of AF.  The various methods of treatment have been discussed with the patient and family. After consideration of risks, benefits and other options for treatment, the patient has consented to  Procedure(s): Catheter ablation as a surgical intervention .  Risks include but not limited to complete heart block, stroke, esophageal damage, nerve damage, bleeding, vascular  damage, tamponade, perforation, MI, and death. The patient's history has been reviewed, patient examined, no change in status, stable for surgery.  I have reviewed the patient's chart and labs.  Questions were answered to the patient's satisfaction.    Allegra Lai, MD 08/09/2022 12:36 PM

## 2022-08-09 NOTE — Anesthesia Procedure Notes (Signed)
Procedure Name: Intubation Date/Time: 08/09/2022 2:09 PM  Performed by: Lorie Phenix, CRNAPre-anesthesia Checklist: Patient identified, Emergency Drugs available, Suction available and Patient being monitored Patient Re-evaluated:Patient Re-evaluated prior to induction Oxygen Delivery Method: Circle system utilized Preoxygenation: Pre-oxygenation with 100% oxygen Induction Type: IV induction Ventilation: Mask ventilation without difficulty Laryngoscope Size: Glidescope and 3 Grade View: Grade I Tube type: Oral Number of attempts: 1 Airway Equipment and Method: Rigid stylet and Video-laryngoscopy Placement Confirmation: ETT inserted through vocal cords under direct vision, positive ETCO2 and breath sounds checked- equal and bilateral Secured at: 22 cm Tube secured with: Tape Dental Injury: Teeth and Oropharynx as per pre-operative assessment  Difficulty Due To: Difficult Airway- due to limited oral opening, Difficulty was anticipated and Difficult Airway- due to reduced neck mobility

## 2022-08-09 NOTE — Anesthesia Preprocedure Evaluation (Addendum)
Anesthesia Evaluation  Patient identified by MRN, date of birth, ID band Patient awake    Reviewed: Allergy & Precautions, H&P , NPO status , Patient's Chart, lab work & pertinent test results  History of Anesthesia Complications (+) PONV and history of anesthetic complications  Airway Mallampati: II   Neck ROM: full    Dental   Pulmonary asthma , former smoker,    breath sounds clear to auscultation       Cardiovascular hypertension, + CAD  + dysrhythmias Atrial Fibrillation  Rhythm:irregular Rate:Normal     Neuro/Psych  Neuromuscular disease    GI/Hepatic GERD  ,  Endo/Other    Renal/GU      Musculoskeletal  (+) Arthritis ,   Abdominal   Peds  Hematology   Anesthesia Other Findings   Reproductive/Obstetrics                            Anesthesia Physical Anesthesia Plan  ASA: 3  Anesthesia Plan: General   Post-op Pain Management:    Induction: Intravenous  PONV Risk Score and Plan: 4 or greater and Ondansetron, Dexamethasone and Treatment may vary due to age or medical condition  Airway Management Planned: Oral ETT  Additional Equipment:   Intra-op Plan:   Post-operative Plan: Extubation in OR  Informed Consent: I have reviewed the patients History and Physical, chart, labs and discussed the procedure including the risks, benefits and alternatives for the proposed anesthesia with the patient or authorized representative who has indicated his/her understanding and acceptance.     Dental advisory given  Plan Discussed with: CRNA and Anesthesiologist  Anesthesia Plan Comments:         Anesthesia Quick Evaluation

## 2022-08-09 NOTE — Progress Notes (Signed)
Dr Curt Bears notified upon arrival to [facility] pt c/o substernal CP dull 3/10 and currently feels better at this time-no orders received-states he will be in to see pt

## 2022-08-10 ENCOUNTER — Encounter (HOSPITAL_COMMUNITY): Payer: Self-pay | Admitting: Cardiology

## 2022-08-30 NOTE — Progress Notes (Signed)
Carelink Summary Report / Loop Recorder 

## 2022-08-31 ENCOUNTER — Encounter (HOSPITAL_COMMUNITY): Payer: Self-pay | Admitting: Physician Assistant

## 2022-08-31 ENCOUNTER — Ambulatory Visit (HOSPITAL_COMMUNITY)
Admission: RE | Admit: 2022-08-31 | Discharge: 2022-08-31 | Disposition: A | Discharge status: Home / Self Care | Payer: Medicare Other | Source: Ambulatory Visit | Attending: Physician Assistant | Admitting: Physician Assistant

## 2022-08-31 VITALS — BP 116/82 | HR 64 | Ht 64.0 in | Wt 169.0 lb

## 2022-08-31 DIAGNOSIS — I480 Paroxysmal atrial fibrillation: Secondary | ICD-10-CM

## 2022-08-31 DIAGNOSIS — Z7901 Long term (current) use of anticoagulants: Secondary | ICD-10-CM | POA: Diagnosis not present

## 2022-08-31 DIAGNOSIS — I10 Essential (primary) hypertension: Secondary | ICD-10-CM | POA: Diagnosis not present

## 2022-08-31 DIAGNOSIS — D6869 Other thrombophilia: Secondary | ICD-10-CM | POA: Diagnosis not present

## 2022-08-31 NOTE — Progress Notes (Signed)
Primary Care Physician: Manon Hilding, MD Primary Electrophysiologist: Dr Curt Bears  Referring Physician: Device clinic/Renee Charlcie Cradle PA   Heather Cunningham is a 71 y.o. female with a history of paroxysmal atrial fibrillation and HTN who presents for follow up in the [facility].  The patient was initially diagnosed with atrial fibrillation remotely and has been maintained on PRN diltiazem. Patient is on Xarelto for a CHADS2VASC score of 3. She underwent ILR implant on 09/2019 for afib management. She was started on Multaq which did well controlling her afib. She saw Dr Curt Bears to discuss ablation in order to get off AAD.   On follow up today, patient is s/p afib ablation with Dr Curt Bears on 08/09/22. She has done well since the procedure with no episodes of afib on ILR. She denies CP, swallowing pain, or groin issues.   Today, she denies symptoms of palpitations, chest pain, shortness of breath, orthopnea, PND, lower extremity edema, presyncope, syncope, bleeding, or neurologic sequela. The patient is tolerating medications without difficulties and is otherwise without complaint today.    Atrial Fibrillation Risk Factors:  she does have symptoms or diagnosis of sleep apnea. she does not have a history of rheumatic fever. she does not have a history of alcohol use. The patient does not have a history of early familial atrial fibrillation or other arrhythmias.  she has a BMI of Body mass index is 29.01 kg/m.Marland Kitchen Filed Weights   08/31/22 1048  Weight: 76.7 kg    Family History  Problem Relation Age of Onset   Cancer Other    Heart failure Other    Colon cancer Mother    Colon cancer Maternal Grandmother    Colon polyps Neg Hx      Atrial Fibrillation Management history:  Previous antiarrhythmic drugs: Multaq Previous cardioversions: none Previous ablations: 08/09/22 CHADS2VASC score: 3 Anticoagulation history: Xarelto   Past Medical History:  Diagnosis  Date   Arthritis    back   Asthma    reactive airway around cats and dogs   Bilateral carpal tunnel syndrome    Cancer ([place])    Skin   GERD (gastroesophageal reflux disease)    Glaucoma    Hypertension    Paroxysmal atrial fibrillation (HCC)    PONV (postoperative nausea and vomiting)    Premature ventricular contraction    Status post placement of implantable loop recorder    Past Surgical History:  Procedure Laterality Date   ATRIAL FIBRILLATION ABLATION N/A 08/09/2022   Procedure: ATRIAL FIBRILLATION ABLATION;  Surgeon: Constance Haw, MD;  Location: [facility];  Service: Cardiovascular;  Laterality: N/A;   BREAST EXCISIONAL BIOPSY Left 1996   BREAST EXCISIONAL BIOPSY Right 1993   BREAST LUMPECTOMY     CATARACT EXTRACTION     COLONOSCOPY N/A 09/05/2015   Procedure: COLONOSCOPY;  Surgeon: [name], MD;  [facility];  Service: Endoscopy;  Laterality: N/A;  incomplete colonoscopy   COLONOSCOPY WITH PROPOFOL N/A 11/01/2015   GLO:VFIEPPIR hemorrhoids/five polyps/mild diverticulosis, tubular adenomas.  Next colonoscopy 3 years with propofol with overtube.   COLONOSCOPY WITH PROPOFOL N/A 11/02/2019   Procedure: COLONOSCOPY WITH PROPOFOL;  Surgeon: [name], MD;  [facility]: [facility];  Service: Endoscopy;  Laterality: N/A;  2:00pm with overtube   EYE SURGERY     HYSTEROSCOPY WITH D & C N/A 12/31/2016   Procedure: DILATATION AND CURETTAGE /HYSTEROSCOPY, POSSIBLE REMOVAL OF ENDOMETRIAL LESION;  Surgeon: [name], MD;  Location: [facility];  Service: Gynecology;  Laterality: N/A;   implantable loop recorder placement  09/28/2019   Medtronic Reveal [place] model LNQ22 ([place] [id number]) implantable loop recorder implanted by Dr Rayann Heman in office    Maxiofacial     POLYPECTOMY  11/01/2015   Procedure: POLYPECTOMY;  Surgeon: [name], MD;  Location: [facility];  Service: Endoscopy;;   POLYPECTOMY  11/02/2019   Procedure: POLYPECTOMY;  Surgeon: [name], MD;  [facility];  Service: Endoscopy;;   RETINAL DETACHMENT SURGERY  1997  1999   TUBAL LIGATION      Current Outpatient Medications  Medication Sig Dispense Refill   acetaminophen (TYLENOL) 325 MG tablet Take 650 mg by mouth every 6 (six) hours as needed for mild pain.     albuterol (VENTOLIN HFA) 108 (90 Base) MCG/ACT inhaler Inhale 2 puffs into the lungs every 6 (six) hours as needed for wheezing or shortness of breath.      calcium carbonate (TUMS - DOSED IN MG ELEMENTAL CALCIUM) 500 MG chewable tablet Chew 1 tablet by mouth daily as needed for heartburn. er     Calcium Carbonate-Vitamin D 600-400 MG-UNIT tablet Take 1 tablet by mouth 2 (two) times daily.     diltiazem (CARDIZEM) 30 MG tablet TAKE 1 TABLET BY MOUTH FOUR TIMES DAILY AS NEEDED 30 tablet 6   docusate sodium (COLACE) 100 MG capsule Take 100 mg by mouth daily as needed for mild constipation or moderate constipation.     dorzolamide (TRUSOPT) 2 % ophthalmic solution Place 1 drop into both eyes 2 (two) times daily.      dronedarone (MULTAQ) 400 MG tablet Take 1 tablet (400 mg total) by mouth 2 (two) times daily with a meal. 36 tablet 0   latanoprost (XALATAN) 0.005 % ophthalmic solution Place 1 drop into both eyes at bedtime.     lisinopril-hydrochlorothiazide (PRINZIDE,ZESTORETIC) 20-25 MG tablet Take 1 tablet by mouth at bedtime.      meclizine (ANTIVERT) 25 MG tablet Take 25 mg by mouth 3 (three) times daily as needed for dizziness.     Melatonin 3 MG CAPS Take 3 mg by mouth at bedtime as needed (sleep).     metoprolol tartrate (LOPRESSOR) 50 MG tablet TAKE 1 TABLET BY MOUTH TWICE DAILY 60 tablet 6   omeprazole (PRILOSEC) 40 MG capsule Take 40 mg by mouth 2 (two) times daily with a meal.     simvastatin (ZOCOR) 10 MG tablet Take 1 tablet (10 mg total) by mouth at bedtime. 30 tablet 0   traZODone (DESYREL) 50 MG tablet Take 50 mg by mouth at bedtime as needed for sleep.     XARELTO 20 MG TABS tablet TAKE 1  TABLET BY MOUTH EVERY DAY WITH SUPPER 30 tablet 5   No current facility-administered medications for this encounter.    Allergies  Allergen Reactions   Augmentin [Amoxicillin-Pot Clavulanate] Diarrhea and Nausea Only   Ciprofloxacin Diarrhea and Nausea Only   Other     CANNOT RECEIVE AN MRI DUE TO METAL BAND (RIGHT EYE) DUE TO DETATCHED RETINA     Social History   Socioeconomic History   Marital status: Married    Spouse name: [name]   Number of children: 1   Years of education: Not on file   Highest education level: Not on file  Occupational History   Occupation: [employer]    Employer: OTHER  Tobacco Use   Smoking status: Former  Packs/day: 1.00    Years: 10.00    Total pack years: 10.00    Types: Cigarettes    Start date: 12/17/1966    Quit date: 12/17/1976    Years since quitting: 45.7   Smokeless tobacco: Never   Tobacco comments:    Former smoker quit 19yr ago  Vaping Use   Vaping Use: Never used  Substance and Sexual Activity   Alcohol use: No    Alcohol/week: 0.0 standard drinks of alcohol   Drug use: No   Sexual activity: Not on file  Other Topics Concern   Not on file  Social History Narrative   Pt gets regular exercise. IS A retired [place] FOR [employer] KIDS: [address] [place], [place] WAS IN THE [employer].   Social Determinants of Health   Financial Resource Strain: Not on file  Food Insecurity: Not on file  Transportation Needs: Not on file  Physical Activity: Not on file  Stress: Not on file  Social Connections: Not on file  Intimate Partner Violence: Not on file     ROS- All systems are reviewed and negative except as per the HPI above.  Physical Exam: Vitals:   08/31/22 1048  BP: 116/82  Pulse: 64  Weight: 76.7 kg  Height: '5\' 4"'$  (1.626 m)     GEN- The patient is a well appearing female, alert and oriented x 3 today.   HEENT-head normocephalic, atraumatic,  sclera clear, conjunctiva pink, hearing intact, trachea midline. Lungs- Clear to ausculation bilaterally, normal work of breathing Heart- Regular rate and rhythm, no murmurs, rubs or gallops  GI- soft, NT, ND, + BS Extremities- no clubbing, cyanosis, or edema MS- no significant deformity or atrophy Skin- no rash or lesion Psych- euthymic mood, full affect Neuro- strength and sensation are intact   Wt Readings from Last 3 Encounters:  08/31/22 76.7 kg  08/09/22 76.7 kg  04/19/22 76.2 kg    EKG today demonstrates  SR, 1st degree AV block Vent. rate 64 BPM PR interval 212 ms QRS duration 82 ms QT/QTcB 448/462 ms  Echo 11/30/15 demonstrated  - Left ventricle: The cavity size was normal. There was mild    concentric hypertrophy. Systolic function was normal. The    estimated ejection fraction was in the range of 60% to 65%. Wall    motion was normal; there were no regional wall motion    abnormalities. Left ventricular diastolic function parameters    were normal.  - Aortic valve: Trileaflet; normal thickness leaflets. There was no    regurgitation.  - Aortic root: The aortic root was normal in size.  - Ascending aorta: The ascending aorta was normal in size.  - Mitral valve: Structurally normal valve. There was no    regurgitation.  - Left atrium: The atrium was normal in size.  - Right ventricle: The cavity size was normal. Wall thickness was    normal. Systolic function was normal.  - Right atrium: The atrium was normal in size.  - Tricuspid valve: There was trivial regurgitation.  - Pulmonic valve: There was no regurgitation.  - Pulmonary arteries: Systolic pressure was within the normal    range.  - Inferior vena cava: The vessel was normal in size.  - Pericardium, extracardiac: There was no pericardial effusion.   Impressions:   - Normal study.   Epic records are reviewed at length today  CHA2DS2-VASc Score = 3  The patient's score is based upon: CHF History:  0 HTN History: 1 Diabetes History: 0 Stroke History: 0 Vascular Disease History: 0 Age Score: 1 Gender Score: 1       ASSESSMENT AND PLAN: 1. Paroxysmal Atrial Fibrillation (ICD10:  I48.0) The patient's CHA2DS2-VASc score is 3, indicating a 3.2% annual risk of stroke. S/p afib ablation 08/09/22 ILR shows 0% afib burden Continue Multaq 400 mg BID for now, hopefully short term post ablation.  Continue diltiazem 120 mg daily. Continue 30 mg PRN q 4 hours. Continue Xarelto 20 mg daily with no missed doses for 3 months post ablation.  Continue Lopressor 50 mg BID  2. Secondary Hypercoagulable State (ICD10:  D68.69) The patient is at significant risk for stroke/thromboembolism based upon her CHA2DS2-VASc Score of 3.  Continue Rivaroxaban (Xarelto).   3. HTN Stable, no changes today.   Follow up with Dr Curt Bears as scheduled.    [facility] [address] [place], [place] [address] [phone] 08/31/2022 10:54 AM

## 2022-09-03 ENCOUNTER — Ambulatory Visit (INDEPENDENT_AMBULATORY_CARE_PROVIDER_SITE_OTHER): Payer: Medicare Other

## 2022-09-03 DIAGNOSIS — I480 Paroxysmal atrial fibrillation: Secondary | ICD-10-CM | POA: Diagnosis not present

## 2022-09-06 DIAGNOSIS — Z6832 Body mass index (BMI) 32.0-32.9, adult: Secondary | ICD-10-CM | POA: Diagnosis not present

## 2022-09-06 DIAGNOSIS — Z23 Encounter for immunization: Secondary | ICD-10-CM | POA: Diagnosis not present

## 2022-09-06 LAB — CUP PACEART REMOTE DEVICE CHECK
Date Time Interrogation Session: 20230918080228
Implantable Pulse Generator Implant Date: 20201012

## 2022-09-17 NOTE — Progress Notes (Signed)
Carelink Summary Report / Loop Recorder 

## 2022-09-18 ENCOUNTER — Other Ambulatory Visit: Payer: Self-pay | Admitting: Physician Assistant

## 2022-09-24 ENCOUNTER — Telehealth: Payer: Self-pay | Admitting: Cardiology

## 2022-09-24 NOTE — Telephone Encounter (Signed)
Pt is going to need some peridontal work done and will need to hold blood thinner. Pt informed that best step would be to hold off until after her appt on 11/15. Aware that we will address request dentist sent in, once we receive it. Pt states would prefer to wait until after seeing Dr. Curt Bears next month. She appreciates the follow up call.

## 2022-09-24 NOTE — Telephone Encounter (Signed)
Pt is requesting call back to discuss upcoming dental procedure. She says that the dentist office is going to be sending something as well, but would like to discuss this with someone since she just had ablation recently.

## 2022-09-24 NOTE — Telephone Encounter (Signed)
   Pre-operative Risk Assessment    Patient Name: Heather Cunningham  DOB: May 19, 1951 MRN: [id number]     Request for Surgical Clearance    Procedure:  Dental Extraction - Amount of Teeth to be Pulled:  3   Date of Surgery:  Clearance TBD                                 Surgeon:  Dr. Golden Circle Group or Practice Name:  [facility]  Phone number:  [phone] Fax number:  [phone]   Type of Clearance Requested:   - Medical  - Pharmacy:  Hold Rivaroxaban (Xarelto) Need to know when to hold and for how long she can hold    Type of Anesthesia:  Local    Additional requests/questions:  Please advise surgeon/provider what medications should be held.  Signed, April Henson   09/24/2022, 1:49 PM

## 2022-09-25 NOTE — Telephone Encounter (Signed)
Primary Cardiologist:Suresh Bronson Ing, MD (Inactive)   Preoperative team, please contact this patient and set up a phone call appointment for further preoperative risk assessment. Please obtain consent and complete medication review. Thank you for your help.   I confirm that guidance regarding antiplatelet and oral anticoagulation therapy has been completed and, if necessary, noted below.   Emmaline Life, NP-C  09/25/2022, 9:51 AM [address], [address] Office [phone] Fax [phone]

## 2022-09-25 NOTE — Telephone Encounter (Signed)
Patient with diagnosis of A Fib on Xarelto for anticoagulation. Patient had ablation on 08/09/22.  Procedure: Dental Extraction - Amount of Teeth to be Pulled:  3  Date of procedure: TBD   CHA2DS2-VASc Score = 3  This indicates a 3.2% annual risk of stroke. The patient's score is based upon: CHF History: 0 HTN History: 1 Diabetes History: 0 Stroke History: 0 Vascular Disease History: 0 Age Score: 1 Gender Score: 1  CrCl 88 mL/min Platelet count 274K  Patient does not require pre-op antibiotics for dental procedure.  Patient must remain on anticoagulation for at least 2 months post ablation therefore do not recommend procedure before 10/09/22. Has follow up with Heather Cunningham on 10/31/22 when clearance can be addressed.  **This guidance is not considered finalized until pre-operative APP has relayed final recommendations.**

## 2022-09-26 NOTE — Telephone Encounter (Signed)
Left message for the pt to call back for tele pre op appt.  ?

## 2022-09-26 NOTE — Telephone Encounter (Signed)
Pt called back about clearance. I confirmed with the pt that she is having 3 teeth extracted. The pt states no, she is not having any teeth extracted. Pt tells me that she is having some fillings and gum surgery on the same visit. She states she is supposed to have a root canal as well, but feels that will not be done in the same appt.   See notes further in the note from pharm-d. Pt will need to wait until she see's Dr. Curt Bears 10/31/22, which will about 3 months post ablation.  Pt will not be having any dental procedure until after she has seen Dr. Curt Bears.   I will update Dr. Tamala Julian with [facility] pt will see her cardiologist 10/31/22 before proceeding with dental procedure.

## 2022-09-26 NOTE — Telephone Encounter (Signed)
Pt is returning call. Transferred to Julaine Hua, [place].

## 2022-10-08 ENCOUNTER — Ambulatory Visit (INDEPENDENT_AMBULATORY_CARE_PROVIDER_SITE_OTHER): Payer: Medicare Other

## 2022-10-08 DIAGNOSIS — I480 Paroxysmal atrial fibrillation: Secondary | ICD-10-CM | POA: Diagnosis not present

## 2022-10-09 LAB — CUP PACEART REMOTE DEVICE CHECK
Date Time Interrogation Session: 20231022230644
Implantable Pulse Generator Implant Date: 20201012

## 2022-10-19 ENCOUNTER — Other Ambulatory Visit (HOSPITAL_COMMUNITY): Payer: Self-pay

## 2022-10-19 MED ORDER — MULTAQ 400 MG PO TABS: 400.0000 mg | ORAL_TABLET | Freq: Two times a day (BID) | ORAL | 0 refills | Status: DC

## 2022-10-24 DIAGNOSIS — E782 Mixed hyperlipidemia: Secondary | ICD-10-CM | POA: Diagnosis not present

## 2022-10-24 DIAGNOSIS — R739 Hyperglycemia, unspecified: Secondary | ICD-10-CM | POA: Diagnosis not present

## 2022-10-24 DIAGNOSIS — I10 Essential (primary) hypertension: Secondary | ICD-10-CM | POA: Diagnosis not present

## 2022-10-24 DIAGNOSIS — R5383 Other fatigue: Secondary | ICD-10-CM | POA: Diagnosis not present

## 2022-10-24 DIAGNOSIS — K219 Gastro-esophageal reflux disease without esophagitis: Secondary | ICD-10-CM | POA: Diagnosis not present

## 2022-10-24 DIAGNOSIS — E7849 Other hyperlipidemia: Secondary | ICD-10-CM | POA: Diagnosis not present

## 2022-10-31 ENCOUNTER — Encounter: Payer: Self-pay | Admitting: Cardiology

## 2022-10-31 ENCOUNTER — Ambulatory Visit: Payer: Medicare Other | Attending: Cardiology | Admitting: Cardiology

## 2022-10-31 VITALS — BP 110/76 | HR 63 | Ht 64.0 in | Wt 171.0 lb

## 2022-10-31 DIAGNOSIS — R7301 Impaired fasting glucose: Secondary | ICD-10-CM | POA: Diagnosis not present

## 2022-10-31 DIAGNOSIS — I480 Paroxysmal atrial fibrillation: Secondary | ICD-10-CM | POA: Insufficient documentation

## 2022-10-31 DIAGNOSIS — E7849 Other hyperlipidemia: Secondary | ICD-10-CM | POA: Diagnosis not present

## 2022-10-31 DIAGNOSIS — Z6829 Body mass index (BMI) 29.0-29.9, adult: Secondary | ICD-10-CM | POA: Diagnosis not present

## 2022-10-31 DIAGNOSIS — K219 Gastro-esophageal reflux disease without esophagitis: Secondary | ICD-10-CM | POA: Diagnosis not present

## 2022-10-31 DIAGNOSIS — R5383 Other fatigue: Secondary | ICD-10-CM | POA: Diagnosis not present

## 2022-10-31 DIAGNOSIS — D6869 Other thrombophilia: Secondary | ICD-10-CM

## 2022-10-31 DIAGNOSIS — R42 Dizziness and giddiness: Secondary | ICD-10-CM | POA: Diagnosis not present

## 2022-10-31 DIAGNOSIS — E119 Type 2 diabetes mellitus without complications: Secondary | ICD-10-CM | POA: Diagnosis not present

## 2022-10-31 DIAGNOSIS — G4700 Insomnia, unspecified: Secondary | ICD-10-CM | POA: Diagnosis not present

## 2022-10-31 DIAGNOSIS — I10 Essential (primary) hypertension: Secondary | ICD-10-CM | POA: Diagnosis not present

## 2022-10-31 NOTE — Progress Notes (Addendum)
Electrophysiology Office Note   Date:  10/31/2022   ID:  Heather Cunningham, DOB 1951/04/13, MRN [id number]  PCP:  Manon Hilding, MD  Cardiologist:   Primary Electrophysiologist:   Meredith Leeds, MD    Chief Complaint: AF   History of Present Illness: Heather Cunningham is a 71 y.o. female who is being seen today for the evaluation of AF at the request of Sasser, Silvestre Moment, MD. Presenting today for electrophysiology evaluation.  She has a history significant for paroxysmal atrial fibrillation and hypertension.  She has a Linq monitor implanted for atrial fibrillation management.  She has had significant weakness and fatigue when in atrial fibrillation.  She is now status post ablation 08/09/2022.  She had ablation so that she get off Multaq.  She is currently going through issues with her brother-in-Brosious coming off of dialysis.  She would like to stop the Multaq after these issues have resolved.  Today, denies symptoms of palpitations, chest pain, shortness of breath, orthopnea, PND, lower extremity edema, claudication, dizziness, presyncope, syncope, bleeding, or neurologic sequela. The patient is tolerating medications without difficulties.     Past Medical History:  Diagnosis Date   Arthritis    back   Asthma    reactive airway around cats and dogs   Bilateral carpal tunnel syndrome    Cancer (HCC)    Skin   GERD (gastroesophageal reflux disease)    Glaucoma    Hypertension    Paroxysmal atrial fibrillation (HCC)    PONV (postoperative nausea and vomiting)    Premature ventricular contraction    Status post placement of implantable loop recorder    Past Surgical History:  Procedure Laterality Date   ATRIAL FIBRILLATION ABLATION N/A 08/09/2022   Procedure: ATRIAL FIBRILLATION ABLATION;  Surgeon: Constance Haw, MD;  Location: [facility];  Service: Cardiovascular;  Laterality: N/A;   BREAST EXCISIONAL BIOPSY Left 1996   BREAST EXCISIONAL BIOPSY Right 1993   BREAST  LUMPECTOMY     CATARACT EXTRACTION     COLONOSCOPY N/A 09/05/2015   Procedure: COLONOSCOPY;  Surgeon: [name], MD;  [facility]: [facility];  Service: Endoscopy;  Laterality: N/A;  incomplete colonoscopy   COLONOSCOPY WITH PROPOFOL N/A 11/01/2015   ZES:PQZRAQTM hemorrhoids/five polyps/mild diverticulosis, tubular adenomas.  Next colonoscopy 3 years with propofol with overtube.   COLONOSCOPY WITH PROPOFOL N/A 11/02/2019   Procedure: COLONOSCOPY WITH PROPOFOL;  Surgeon: [name], MD;  [facility]: [facility];  Service: Endoscopy;  Laterality: N/A;  2:00pm with overtube   EYE SURGERY     HYSTEROSCOPY WITH D & C N/A 12/31/2016   Procedure: DILATATION AND CURETTAGE /HYSTEROSCOPY, POSSIBLE REMOVAL OF ENDOMETRIAL LESION;  Surgeon: [name], MD;  Location: [facility];  Service: Gynecology;  Laterality: N/A;   implantable loop recorder placement  09/28/2019   Medtronic Reveal Between model LNQ22 ([place] [id number]) implantable loop recorder implanted by Dr Rayann Heman in office    Maxiofacial     POLYPECTOMY  11/01/2015   Procedure: POLYPECTOMY;  Surgeon: [name], MD;  Location: [facility];  Service: Endoscopy;;   POLYPECTOMY  11/02/2019   Procedure: POLYPECTOMY;  Surgeon: [name], MD;  Location: [facility];  Service: Endoscopy;;   RETINAL DETACHMENT SURGERY  1997  1999   TUBAL LIGATION       Current Outpatient Medications  Medication Sig Dispense Refill   acetaminophen (TYLENOL) 325 MG tablet Take 650 mg by mouth every 6 (six) hours as needed  for mild pain.     albuterol (VENTOLIN HFA) 108 (90 Base) MCG/ACT inhaler Inhale 2 puffs into the lungs every 6 (six) hours as needed for wheezing or shortness of breath.      calcium carbonate (TUMS - DOSED IN MG ELEMENTAL CALCIUM) 500 MG chewable tablet Chew 1 tablet by mouth daily as needed for heartburn. er     Calcium Carbonate-Vitamin D 600-400 MG-UNIT tablet Take 1 tablet by mouth 2 (two) times daily.     diltiazem (CARDIZEM) 30 MG  tablet TAKE 1 TABLET BY MOUTH FOUR TIMES DAILY AS NEEDED 30 tablet 6   docusate sodium (COLACE) 100 MG capsule Take 100 mg by mouth daily as needed for mild constipation or moderate constipation.     dorzolamide (TRUSOPT) 2 % ophthalmic solution Place 1 drop into both eyes 2 (two) times daily.      dronedarone (MULTAQ) 400 MG tablet Take 1 tablet (400 mg total) by mouth 2 (two) times daily with a meal. 18 tablet 0   latanoprost (XALATAN) 0.005 % ophthalmic solution Place 1 drop into both eyes at bedtime.     lisinopril-hydrochlorothiazide (PRINZIDE,ZESTORETIC) 20-25 MG tablet Take 1 tablet by mouth at bedtime.      meclizine (ANTIVERT) 25 MG tablet Take 25 mg by mouth 3 (three) times daily as needed for dizziness.     Melatonin 3 MG CAPS Take 3 mg by mouth at bedtime as needed (sleep).     metoprolol tartrate (LOPRESSOR) 50 MG tablet TAKE 1 TABLET BY MOUTH TWICE DAILY 60 tablet 6   omeprazole (PRILOSEC) 40 MG capsule Take 40 mg by mouth 2 (two) times daily with a meal.     simvastatin (ZOCOR) 10 MG tablet Take 1 tablet (10 mg total) by mouth at bedtime. 30 tablet 0   traZODone (DESYREL) 50 MG tablet Take 50 mg by mouth at bedtime as needed for sleep.     XARELTO 20 MG TABS tablet TAKE 1 TABLET BY MOUTH EVERY DAY WITH SUPPER 30 tablet 5   No current facility-administered medications for this visit.    Allergies:   Augmentin [amoxicillin-pot clavulanate], Ciprofloxacin, and Other   Social History:  The patient  reports that she quit smoking about 45 years ago. Her smoking use included cigarettes. She started smoking about 55 years ago. She has a 10.00 pack-year smoking history. She has never used smokeless tobacco. She reports that she does not drink alcohol and does not use drugs.   Family History:  The patient's family history includes Cancer in an other family member; Colon cancer in her maternal grandmother and mother; Heart failure in an other family member.   ROS:  Please see the history  of present illness.   Otherwise, review of systems is positive for none.   All other systems are reviewed and negative.   PHYSICAL EXAM: VS:  BP 110/76   Pulse 63   Ht '5\' 4"'$  (1.626 m)   Wt 171 lb (77.6 kg)   SpO2 96%   BMI 29.35 kg/m  , BMI Body mass index is 29.35 kg/m. GEN: Well nourished, well developed, in no acute distress  HEENT: normal  Neck: no JVD, carotid bruits, or masses Cardiac: RRR; no murmurs, rubs, or gallops,no edema  Respiratory:  clear to auscultation bilaterally, normal work of breathing GI: soft, nontender, nondistended, + BS MS: no deformity or atrophy  Skin: warm and dry, device site well healed Neuro:  Strength and sensation are intact Psych: euthymic mood, full affect  EKG:  EKG is ordered today. Personal review of the ekg ordered shows sinus rhythm  Personal review of the device interrogation today. Results in [facility]: 07/20/2022: BUN 17; Creatinine, Ser 0.69; Hemoglobin 12.6; Platelets 274; Potassium 4.0; Sodium 137    Lipid Panel  No results found for: "CHOL", "TRIG", "HDL", "CHOLHDL", "VLDL", "LDLCALC", "LDLDIRECT"   Wt Readings from Last 3 Encounters:  10/31/22 171 lb (77.6 kg)  08/31/22 169 lb (76.7 kg)  08/09/22 169 lb (76.7 kg)      Other studies Reviewed: Additional studies/ records that were reviewed today include: TTE 2016  Review of the above records today demonstrates:  - Left ventricle: The cavity size was normal. There was mild    concentric hypertrophy. Systolic function was normal. The    estimated ejection fraction was in the range of 60% to 65%. Wall    motion was normal; there were no regional wall motion    abnormalities. Left ventricular diastolic function parameters    were normal.  - Aortic valve: Trileaflet; normal thickness leaflets. There was no    regurgitation.  - Aortic root: The aortic root was normal in size.  - Ascending aorta: The ascending aorta was normal in size.  - Mitral valve:  Structurally normal valve. There was no    regurgitation.  - Left atrium: The atrium was normal in size.  - Right ventricle: The cavity size was normal. Wall thickness was    normal. Systolic function was normal.  - Right atrium: The atrium was normal in size.  - Tricuspid valve: There was trivial regurgitation.  - Pulmonic valve: There was no regurgitation.  - Pulmonary arteries: Systolic pressure was within the normal    range.  - Inferior vena cava: The vessel was normal in size.  - Pericardium, extracardiac: There was no pericardial effusion.    ASSESSMENT AND PLAN:  1.  Paroxysmal atrial fibrillation: CHA2DS2-VASc of 3.  Currently on Multaq 400 mg twice daily, Xarelto 20 mg daily.  She is now status post atrial fibrillation ablation 08/09/2022.  She has had no further episodes of atrial fibrillation.  She is undergoing some family issues, but would like to stop her Multaq.  Her brother-in-[place] has just stopped dialysis.  Once all of those issues get resolved, she  stop her Multaq.  2.  Hypertension: Currently well controlled  3.  Hyperlipidemia: Continue simvastatin 10 mg per primary physician  4.  Secondary hypercoagulable state: Currently on Xarelto for atrial fibrillation as above  5.  Preoperative evaluation: Patient has plans for tooth extraction.  She would be at intermediate risk for this low risk procedure.  Would be able to stop her anticoagulation around the time of tooth extraction if greater than 2 teeth are planned to be extracted.  Current medicines are reviewed at length with the patient today.   The patient does not have concerns regarding her medicines.  The following changes were made today:  stop multaq  Labs/ tests ordered today include:  Orders Placed This Encounter  Procedures   EKG 12-Lead     Disposition:   FU 6 months  Signed,  Meredith Leeds, MD  10/31/2022 3:23 PM     [facility] [address] [place]  [phone] (office) [phone] (fax)

## 2022-10-31 NOTE — Patient Instructions (Signed)
Medication Instructions:  Your physician recommends that you continue on your current medications as directed. Please refer to the Current Medication list given to you today.  *If you need a refill on your cardiac medications before your next appointment, please call your pharmacy*   Lab Work: None ordered   Testing/Procedures: None ordered   Follow-Up: At [facility], you and your health needs are our priority.  As part of our continuing mission to provide you with exceptional heart care, we have created designated Provider Care Teams.  These Care Teams include your primary Cardiologist (physician) and Advanced Practice Providers (APPs -  Physician Assistants and Nurse Practitioners) who all work together to provide you with the care you need, when you need it.  Your next appointment:   6 month(s)  The format for your next appointment:   In Person  Provider:   You will follow up in the [facility] located at [facility]. Your provider will be: Clint R. Fenton, PA-C    Thank you for choosing [facility]!!   Trinidad Curet, RN [phone]  Other Instructions    Important Information About Sugar

## 2022-11-02 NOTE — Progress Notes (Signed)
Carelink Summary Report / Loop Recorder 

## 2022-11-04 ENCOUNTER — Other Ambulatory Visit: Payer: Self-pay | Admitting: Physician Assistant

## 2022-11-04 DIAGNOSIS — I480 Paroxysmal atrial fibrillation: Secondary | ICD-10-CM

## 2022-11-12 ENCOUNTER — Ambulatory Visit (INDEPENDENT_AMBULATORY_CARE_PROVIDER_SITE_OTHER): Payer: Medicare Other

## 2022-11-12 DIAGNOSIS — R002 Palpitations: Secondary | ICD-10-CM | POA: Diagnosis not present

## 2022-11-13 LAB — CUP PACEART REMOTE DEVICE CHECK
Date Time Interrogation Session: 20231126230617
Implantable Pulse Generator Implant Date: 20201012

## 2022-11-26 DIAGNOSIS — H401131 Primary open-angle glaucoma, bilateral, mild stage: Secondary | ICD-10-CM | POA: Diagnosis not present

## 2022-11-26 DIAGNOSIS — H401111 Primary open-angle glaucoma, right eye, mild stage: Secondary | ICD-10-CM | POA: Diagnosis not present

## 2022-11-26 DIAGNOSIS — H401121 Primary open-angle glaucoma, left eye, mild stage: Secondary | ICD-10-CM | POA: Diagnosis not present

## 2022-12-13 ENCOUNTER — Other Ambulatory Visit: Payer: Self-pay | Admitting: Family Medicine

## 2022-12-13 DIAGNOSIS — Z1231 Encounter for screening mammogram for malignant neoplasm of breast: Secondary | ICD-10-CM

## 2022-12-18 ENCOUNTER — Ambulatory Visit (INDEPENDENT_AMBULATORY_CARE_PROVIDER_SITE_OTHER): Payer: Medicare Other

## 2022-12-18 DIAGNOSIS — R002 Palpitations: Secondary | ICD-10-CM | POA: Diagnosis not present

## 2022-12-18 LAB — CUP PACEART REMOTE DEVICE CHECK
Date Time Interrogation Session: 20240101230525
Implantable Pulse Generator Implant Date: 20201012

## 2022-12-24 NOTE — Progress Notes (Signed)
Carelink Summary Report / Loop Recorder 

## 2023-01-18 NOTE — Progress Notes (Signed)
Carelink Summary Report / Loop Recorder 

## 2023-01-20 LAB — CUP PACEART REMOTE DEVICE CHECK
Date Time Interrogation Session: 20240203230346
Implantable Pulse Generator Implant Date: 20201012

## 2023-01-21 ENCOUNTER — Ambulatory Visit: Payer: Medicare Other

## 2023-01-21 DIAGNOSIS — R002 Palpitations: Secondary | ICD-10-CM

## 2023-02-05 ENCOUNTER — Ambulatory Visit
Admission: RE | Admit: 2023-02-05 | Discharge: 2023-02-05 | Disposition: A | Discharge status: Home / Self Care | Payer: Medicare Other | Source: Ambulatory Visit | Attending: Family Medicine | Admitting: Family Medicine

## 2023-02-05 DIAGNOSIS — Z1231 Encounter for screening mammogram for malignant neoplasm of breast: Secondary | ICD-10-CM

## 2023-02-12 ENCOUNTER — Telehealth: Payer: Self-pay

## 2023-02-12 NOTE — Telephone Encounter (Signed)
..     Pre-operative Risk Assessment    Patient Name: Heather Cunningham  DOB: 01/06/1951 MRN: [id number]      Request for Surgical Clearance    Procedure:   GUM SURGERY W ITH POSSIBLE EXTRACTION  Date of Surgery:  Clearance TBD                                 Surgeon:  DR [facility]o Surgeon's Group or Practice Name:  [facility]  Phone number:  [phone] Fax number:  [phone]   Type of Clearance Requested:   - Medical  - Pharmacy:  Hold Rivaroxaban (Xarelto) HOLD FOR 5 DAYS   Type of Anesthesia:  Not Indicated   Additional requests/questions:    Gwenlyn Found   02/12/2023, 1:46 PM

## 2023-02-12 NOTE — Telephone Encounter (Signed)
Will route to pharm then pt will need given medical clearance requested. Last OV 10/2022, cleared for dental extraction but this is a different procedure now listed and clearance >2 months old. Preliminarily no SBE ppx needs identified.

## 2023-02-13 ENCOUNTER — Telehealth: Payer: Self-pay | Admitting: *Deleted

## 2023-02-13 NOTE — Telephone Encounter (Signed)
Patient states she is returning call and is requesting return call.

## 2023-02-13 NOTE — Telephone Encounter (Signed)
   Name: Heather Cunningham  DOB: 1951-04-16  MRN: [id number]  Primary Cardiologist: Kate Sable, MD (Inactive)   Preoperative team, please contact this patient and set up a phone call appointment for further preoperative risk assessment. Please obtain consent and complete medication review. Thank you for your help.  I confirm that guidance regarding antiplatelet and oral anticoagulation therapy has been completed and, if necessary, noted below.  Patient does not require pre-op antibiotics for dental procedure.   Procedure does not require 5 day Xarelto hold. Per office protocol, patient can hold Xarelto for 1-2 days prior to procedure.    Ledora Bottcher, PA 02/13/2023, 9:42 AM [place]

## 2023-02-13 NOTE — Telephone Encounter (Signed)
Patient with diagnosis of afib on Xarelto for anticoagulation.    Procedure: gum surgery with possible extraction Date of procedure: TBD  CHA2DS2-VASc Score = 4  This indicates a 4.8% annual risk of stroke. The patient's score is based upon: CHF History: 0 HTN History: 1 Diabetes History: 0 Stroke History: 0 Vascular Disease History: 1 Age Score: 1 Gender Score: 1  Underwent afib ablation 07/2022.  CrCl 70m/min using adjusted body weight Platelet count 274K  Patient does not require pre-op antibiotics for dental procedure.  Procedure does not require 5 day Xarelto hold. Per office protocol, patient can hold Xarelto for 1-2 days prior to procedure.    **This guidance is not considered finalized until pre-operative APP has relayed final recommendations.**

## 2023-02-13 NOTE — Telephone Encounter (Signed)
  Patient Consent for Virtual Visit         Heather Cunningham has provided verbal consent on 02/13/2023 for a virtual visit (video or telephone).   CONSENT FOR VIRTUAL VISIT FOR:  Heather Cunningham  By participating in this virtual visit I agree to the following:  I hereby voluntarily request, consent and authorize [facility] and its employed or contracted physicians, physician assistants, nurse practitioners or other licensed health care professionals (the Practitioner), to provide me with telemedicine health care services (the "Services") as deemed necessary by the treating Practitioner. I acknowledge and consent to receive the Services by the Practitioner via telemedicine. I understand that the telemedicine visit will involve communicating with the Practitioner through live audiovisual communication technology and the disclosure of certain medical information by electronic transmission. I acknowledge that I have been given the opportunity to request an in-person assessment or other available alternative prior to the telemedicine visit and am voluntarily participating in the telemedicine visit.  I understand that I have the right to withhold or withdraw my consent to the use of telemedicine in the course of my care at any time, without affecting my right to future care or treatment, and that the Practitioner or I may terminate the telemedicine visit at any time. I understand that I have the right to inspect all information obtained and/or recorded in the course of the telemedicine visit and may receive copies of available information for a reasonable fee.  I understand that some of the potential risks of receiving the Services via telemedicine include:  Delay or interruption in medical evaluation due to technological equipment failure or disruption; Information transmitted may not be sufficient (e.g. poor resolution of images) to allow for appropriate medical decision making by the Practitioner; and/or   In rare instances, security protocols could fail, causing a breach of personal health information.  Furthermore, I acknowledge that it is my responsibility to provide information about my medical history, conditions and care that is complete and accurate to the best of my ability. I acknowledge that Practitioner's advice, recommendations, and/or decision may be based on factors not within their control, such as incomplete or inaccurate data provided by me or distortions of diagnostic images or specimens that may result from electronic transmissions. I understand that the practice of medicine is not an exact science and that Practitioner makes no warranties or guarantees regarding treatment outcomes. I acknowledge that a copy of this consent can be made available to me via my patient portal ([facility]), or I can request a printed copy by calling the office of [facility].    I understand that my insurance will be billed for this visit.   I have read or had this consent read to me. I understand the contents of this consent, which adequately explains the benefits and risks of the Services being provided via telemedicine.  I have been provided ample opportunity to ask questions regarding this consent and the Services and have had my questions answered to my satisfaction. I give my informed consent for the services to be provided through the use of telemedicine in my medical care

## 2023-02-19 NOTE — Progress Notes (Unsigned)
Virtual Visit via Telephone Note   Because of Heather Cunningham's co-morbid illnesses, she is at least at moderate risk for complications without adequate follow up.  This format is felt to be most appropriate for this patient at this time.  The patient did not have access to video technology/had technical difficulties with video requiring transitioning to audio format only (telephone).  All issues noted in this document were discussed and addressed.  No physical exam could be performed with this format.  Please refer to the patient's chart for her consent to telehealth for [facility].  Evaluation Performed:  Preoperative cardiovascular risk assessment _____________   Date:  02/19/2023   Patient ID:  Heather Cunningham, DOB 01/29/1951, MRN [id number] Patient Location:  Home Provider location:   Office  Primary Care Provider:  Manon Hilding, MD Primary Cardiologist:  Kate Sable, MD (Inactive)  Chief Complaint / Patient Profile   72 y.o. y/o female with a h/o paroxysmal atrial fibrillation, hypertension who is pending gum surgery with possible extraction and presents today for telephonic preoperative cardiovascular risk assessment.  History of Present Illness    Heather Cunningham is a 72 y.o. female who presents via audio/video conferencing for a telehealth visit today.  Pt was last seen in cardiology clinic on 10/31/2022 by Dr. Curt Bears.  At that time Heather Cunningham was doing well .  The patient is now pending procedure as outlined above. Since her last visit, she remained stable from a cardiac standpoint.  Today she denies chest pain, shortness of breath, lower extremity edema, fatigue, palpitations, melena, hematuria, hemoptysis, diaphoresis, weakness, presyncope, syncope, orthopnea, and PND.   Past Medical History    Past Medical History:  Diagnosis Date   Arthritis    back   Asthma    reactive airway around cats and dogs   Bilateral carpal tunnel syndrome    Cancer (HCC)    Skin    GERD (gastroesophageal reflux disease)    Glaucoma    Hypertension    Paroxysmal atrial fibrillation (HCC)    PONV (postoperative nausea and vomiting)    Premature ventricular contraction    Status post placement of implantable loop recorder    Past Surgical History:  Procedure Laterality Date   ATRIAL FIBRILLATION ABLATION N/A 08/09/2022   Procedure: ATRIAL FIBRILLATION ABLATION;  Surgeon: Constance Haw, MD;  Location: [place] [facility];  Service: Cardiovascular;  Laterality: N/A;   BREAST EXCISIONAL BIOPSY Left 1996   BREAST EXCISIONAL BIOPSY Right 1993   BREAST LUMPECTOMY     CATARACT EXTRACTION     COLONOSCOPY N/A 09/05/2015   Procedure: COLONOSCOPY;  Surgeon: [name], MD;  [facility];  Service: Endoscopy;  Laterality: N/A;  incomplete colonoscopy   COLONOSCOPY WITH PROPOFOL N/A 11/01/2015   [id number] hemorrhoids/five polyps/mild diverticulosis, tubular adenomas.  Next colonoscopy 3 years with propofol with overtube.   COLONOSCOPY WITH PROPOFOL N/A 11/02/2019   Procedure: COLONOSCOPY WITH PROPOFOL;  Surgeon: [name], MD;  [facility];  Service: Endoscopy;  Laterality: N/A;  2:00pm with overtube   EYE SURGERY     HYSTEROSCOPY WITH D & C N/A 12/31/2016   Procedure: DILATATION AND CURETTAGE /HYSTEROSCOPY, POSSIBLE REMOVAL OF ENDOMETRIAL LESION;  Surgeon: [name], MD;  Location: [facility];  Service: Gynecology;  Laterality: N/A;   implantable loop recorder placement  09/28/2019   Medtronic Reveal [place] model LNQ22 (SN [id number]) implantable loop recorder implanted by Dr Rayann Heman in office  Maxiofacial     POLYPECTOMY  11/01/2015   Procedure: POLYPECTOMY;  Surgeon: [name], MD;  Location: [facility];  Service: Endoscopy;;   POLYPECTOMY  11/02/2019   Procedure: POLYPECTOMY;  Surgeon: [name], MD;  [facility];  Service: Endoscopy;;   RETINAL DETACHMENT SURGERY  1997  1999   TUBAL LIGATION       Allergies  Allergies  Allergen Reactions   Augmentin [Amoxicillin-Pot Clavulanate] Diarrhea and Nausea Only   Ciprofloxacin Diarrhea and Nausea Only   Other     CANNOT RECEIVE AN MRI DUE TO METAL BAND (RIGHT EYE) DUE TO DETATCHED RETINA     Home Medications    Prior to Admission medications   Medication Sig Start Date End Date Taking? Authorizing Provider  acetaminophen (TYLENOL) 325 MG tablet Take 650 mg by mouth every 6 (six) hours as needed for mild pain.    [provider]  albuterol (VENTOLIN HFA) 108 (90 Base) MCG/ACT inhaler Inhale 2 puffs into the lungs every 6 (six) hours as needed for wheezing or shortness of breath.     [provider]  calcium carbonate (TUMS - DOSED IN MG ELEMENTAL CALCIUM) 500 MG chewable tablet Chew 1 tablet by mouth daily as needed for heartburn. er    [provider]  Calcium Carbonate-Vitamin D 600-400 MG-UNIT tablet Take 1 tablet by mouth 2 (two) times daily.    [provider]  diltiazem (CARDIZEM) 30 MG tablet TAKE 1 TABLET BY MOUTH FOUR TIMES DAILY AS NEEDED 02/12/22   Fenton, Clint R, PA  docusate sodium (COLACE) 100 MG capsule Take 100 mg by mouth daily as needed for mild constipation or moderate constipation.    [provider]  dorzolamide (TRUSOPT) 2 % ophthalmic solution Place 1 drop into both eyes 2 (two) times daily.     [provider]  dronedarone (MULTAQ) 400 MG tablet Take 1 tablet (400 mg total) by mouth 2 (two) times daily with a meal. 10/19/22   Fenton, Clint R, PA  latanoprost (XALATAN) 0.005 % ophthalmic solution Place 1 drop into both eyes at bedtime.    [provider]  lisinopril-hydrochlorothiazide (PRINZIDE,ZESTORETIC) 20-25 MG tablet Take 1 tablet by mouth at bedtime.  01/04/16   [provider]  meclizine (ANTIVERT) 25 MG tablet Take 25 mg by mouth 3 (three) times daily as needed for dizziness.    [provider]  Melatonin 3 MG CAPS Take 3 mg by  mouth at bedtime as needed (sleep).    [provider]  metoprolol tartrate (LOPRESSOR) 50 MG tablet TAKE 1 TABLET BY MOUTH TWICE DAILY 12/15/21   Fenton, Clint R, PA  omeprazole (PRILOSEC) 40 MG capsule Take 40 mg by mouth 2 (two) times daily with a meal.    [provider]  simvastatin (ZOCOR) 10 MG tablet Take 1 tablet (10 mg total) by mouth at bedtime. 04/11/22   Fenton, Clint R, PA  traZODone (DESYREL) 50 MG tablet Take 50 mg by mouth at bedtime as needed for sleep. 01/14/22   [provider]  XARELTO 20 MG TABS tablet TAKE 1 TABLET BY MOUTH EVERY DAY WITH SUPPER 11/05/22   Fenton, [place] R, [place]    Physical Exam    Vital Signs:  Heather Cunningham does not have vital signs available for review today.  Given telephonic nature of communication, physical exam is limited. AAOx3. NAD. Normal affect.  Speech and respirations are unlabored.  Accessory Clinical Findings  None  Assessment & Plan    1.  Preoperative Cardiovascular Risk Assessment: GUM SURGERY W ITH POSSIBLE EXTRACTION , Dr. Docia Barrier   Primary Cardiologist: Kate Sable, MD (Inactive)  Chart reviewed as part of pre-operative protocol coverage. Given past medical history and time since last visit, based on ACC/AHA guidelines, Heather Cunningham would be at acceptable risk for the planned procedure without further cardiovascular testing.   The patient was advised that if she develops new symptoms prior to surgery to contact our office to arrange for a follow-up visit, and she verbalized understanding.  Procedure does not require 5 day Xarelto hold. Per office protocol, patient can hold Xarelto for 1-2 days prior to procedure.    I will route this recommendation to the requesting party via Epic fax function and remove from pre-op pool.      Time:   Today, I have spent 5 minutes with the patient with telehealth technology discussing medical history, symptoms, and management plan.  Prior to her phone  evaluation I spent greater than 10 minutes reviewing her past medical history and cardiac medications.   Deberah Pelton, NP  02/19/2023, 3:58 PM

## 2023-02-20 ENCOUNTER — Ambulatory Visit: Payer: Medicare Other | Attending: Internal Medicine

## 2023-02-20 DIAGNOSIS — Z01810 Encounter for preprocedural cardiovascular examination: Secondary | ICD-10-CM

## 2023-02-25 ENCOUNTER — Ambulatory Visit (INDEPENDENT_AMBULATORY_CARE_PROVIDER_SITE_OTHER): Payer: Medicare Other

## 2023-02-25 DIAGNOSIS — R002 Palpitations: Secondary | ICD-10-CM

## 2023-02-25 DIAGNOSIS — E119 Type 2 diabetes mellitus without complications: Secondary | ICD-10-CM | POA: Diagnosis not present

## 2023-02-25 DIAGNOSIS — E7849 Other hyperlipidemia: Secondary | ICD-10-CM | POA: Diagnosis not present

## 2023-02-25 DIAGNOSIS — R7301 Impaired fasting glucose: Secondary | ICD-10-CM | POA: Diagnosis not present

## 2023-02-25 DIAGNOSIS — I10 Essential (primary) hypertension: Secondary | ICD-10-CM | POA: Diagnosis not present

## 2023-02-25 DIAGNOSIS — K219 Gastro-esophageal reflux disease without esophagitis: Secondary | ICD-10-CM | POA: Diagnosis not present

## 2023-02-27 LAB — CUP PACEART REMOTE DEVICE CHECK
Date Time Interrogation Session: 20240310230139
Implantable Pulse Generator Implant Date: 20201012

## 2023-03-04 DIAGNOSIS — E119 Type 2 diabetes mellitus without complications: Secondary | ICD-10-CM | POA: Diagnosis not present

## 2023-03-04 DIAGNOSIS — R7301 Impaired fasting glucose: Secondary | ICD-10-CM | POA: Diagnosis not present

## 2023-03-04 DIAGNOSIS — K219 Gastro-esophageal reflux disease without esophagitis: Secondary | ICD-10-CM | POA: Diagnosis not present

## 2023-03-04 DIAGNOSIS — R42 Dizziness and giddiness: Secondary | ICD-10-CM | POA: Diagnosis not present

## 2023-03-04 DIAGNOSIS — G4700 Insomnia, unspecified: Secondary | ICD-10-CM | POA: Diagnosis not present

## 2023-03-04 DIAGNOSIS — E7849 Other hyperlipidemia: Secondary | ICD-10-CM | POA: Diagnosis not present

## 2023-03-04 DIAGNOSIS — R519 Headache, unspecified: Secondary | ICD-10-CM | POA: Diagnosis not present

## 2023-03-04 DIAGNOSIS — I10 Essential (primary) hypertension: Secondary | ICD-10-CM | POA: Diagnosis not present

## 2023-03-04 DIAGNOSIS — I480 Paroxysmal atrial fibrillation: Secondary | ICD-10-CM | POA: Diagnosis not present

## 2023-03-04 DIAGNOSIS — R5383 Other fatigue: Secondary | ICD-10-CM | POA: Diagnosis not present

## 2023-03-04 DIAGNOSIS — Z6829 Body mass index (BMI) 29.0-29.9, adult: Secondary | ICD-10-CM | POA: Diagnosis not present

## 2023-03-07 NOTE — Progress Notes (Signed)
Carelink Summary Report / Loop Recorder 

## 2023-03-08 DIAGNOSIS — R519 Headache, unspecified: Secondary | ICD-10-CM | POA: Diagnosis not present

## 2023-03-16 DIAGNOSIS — R197 Diarrhea, unspecified: Secondary | ICD-10-CM | POA: Diagnosis not present

## 2023-03-16 DIAGNOSIS — R030 Elevated blood-pressure reading, without diagnosis of hypertension: Secondary | ICD-10-CM | POA: Diagnosis not present

## 2023-03-16 DIAGNOSIS — B370 Candidal stomatitis: Secondary | ICD-10-CM | POA: Diagnosis not present

## 2023-03-16 DIAGNOSIS — N760 Acute vaginitis: Secondary | ICD-10-CM | POA: Diagnosis not present

## 2023-04-01 ENCOUNTER — Ambulatory Visit (INDEPENDENT_AMBULATORY_CARE_PROVIDER_SITE_OTHER): Payer: Medicare Other

## 2023-04-01 DIAGNOSIS — R002 Palpitations: Secondary | ICD-10-CM

## 2023-04-01 LAB — CUP PACEART REMOTE DEVICE CHECK
Date Time Interrogation Session: 20240414230339
Implantable Pulse Generator Implant Date: 20201012

## 2023-04-04 NOTE — Progress Notes (Signed)
Carelink Summary Report / Loop Recorder 

## 2023-04-06 ENCOUNTER — Other Ambulatory Visit: Payer: Self-pay | Admitting: Physician Assistant

## 2023-04-29 DIAGNOSIS — H401131 Primary open-angle glaucoma, bilateral, mild stage: Secondary | ICD-10-CM | POA: Diagnosis not present

## 2023-05-01 ENCOUNTER — Ambulatory Visit (HOSPITAL_COMMUNITY)
Admission: RE | Admit: 2023-05-01 | Discharge: 2023-05-01 | Disposition: A | Discharge status: Home / Self Care | Payer: Medicare Other | Source: Ambulatory Visit | Attending: Physician Assistant | Admitting: Physician Assistant

## 2023-05-01 ENCOUNTER — Other Ambulatory Visit: Payer: Self-pay | Admitting: Physician Assistant

## 2023-05-01 VITALS — BP 104/62 | HR 61 | Ht 64.0 in | Wt 170.4 lb

## 2023-05-01 DIAGNOSIS — R600 Localized edema: Secondary | ICD-10-CM | POA: Diagnosis not present

## 2023-05-01 DIAGNOSIS — D6859 Other primary thrombophilia: Secondary | ICD-10-CM | POA: Insufficient documentation

## 2023-05-01 DIAGNOSIS — D6869 Other thrombophilia: Secondary | ICD-10-CM

## 2023-05-01 DIAGNOSIS — I10 Essential (primary) hypertension: Secondary | ICD-10-CM | POA: Diagnosis not present

## 2023-05-01 DIAGNOSIS — Z87891 Personal history of nicotine dependence: Secondary | ICD-10-CM | POA: Diagnosis not present

## 2023-05-01 DIAGNOSIS — I480 Paroxysmal atrial fibrillation: Secondary | ICD-10-CM

## 2023-05-01 DIAGNOSIS — Z7901 Long term (current) use of anticoagulants: Secondary | ICD-10-CM | POA: Diagnosis not present

## 2023-05-01 DIAGNOSIS — R9431 Abnormal electrocardiogram [ECG] [EKG]: Secondary | ICD-10-CM | POA: Diagnosis not present

## 2023-05-01 LAB — BRAIN NATRIURETIC PEPTIDE: B Natriuretic Peptide: 37.8 pg/mL (ref 0.0–100.0)

## 2023-05-01 MED ORDER — DILTIAZEM HCL 30 MG PO TABS: 30.0000 mg | ORAL_TABLET | Freq: Four times a day (QID) | ORAL | 2 refills | Status: DC | PRN

## 2023-05-01 NOTE — Progress Notes (Signed)
Primary Care Physician: Estanislado Pandy, MD Primary Electrophysiologist: Dr Elberta Fortis  Referring Physician: Device clinic/Renee Keitha Butte PA   Heather Cunningham is a 72 y.o. female with a history of paroxysmal atrial fibrillation and HTN who presents for follow up in the [facility].  The patient was initially diagnosed with atrial fibrillation remotely and has been maintained on PRN diltiazem. Patient is on Xarelto for a CHADS2VASC score of 3. She underwent ILR implant on 09/2019 for afib management. She was started on Multaq which did well controlling her afib. She saw Dr Elberta Fortis to discuss ablation in order to get off AAD.   Patient is s/p afib ablation with Dr Elberta Fortis on 08/09/22.   On follow up today, patient reports that she has done well from an afib standpoint. Her ILR shows 0% afib burden with no interim episodes. She remains on Multaq. No bleeding issues on anticoagulation. She has noticed increased lower extremity edema at the end of the day, better in the AM. No orthopnea or weight gain.  Today, she denies symptoms of palpitations, chest pain, shortness of breath, orthopnea, PND, presyncope, syncope, bleeding, or neurologic sequela. The patient is tolerating medications without difficulties and is otherwise without complaint today.    Atrial Fibrillation Risk Factors:  she does have symptoms or diagnosis of sleep apnea. she does not have a history of rheumatic fever. she does not have a history of alcohol use. The patient does not have a history of early familial atrial fibrillation or other arrhythmias.  she has a BMI of Body mass index is 29.25 kg/m.Marland Kitchen Filed Weights   05/01/23 1023  Weight: 77.3 kg    Family History  Problem Relation Age of Onset   Cancer Other    Heart failure Other    Colon cancer Mother    Colon cancer Maternal Grandmother    Colon polyps Neg Hx      Atrial Fibrillation Management history:  Previous antiarrhythmic drugs:  Multaq Previous cardioversions: none Previous ablations: 08/09/22 CHADS2VASC score: 3 Anticoagulation history: Xarelto   Past Medical History:  Diagnosis Date   Arthritis    back   Asthma    reactive airway around cats and dogs   Bilateral carpal tunnel syndrome    Cancer (HCC)    Skin   GERD (gastroesophageal reflux disease)    Glaucoma    Hypertension    Paroxysmal atrial fibrillation (HCC)    PONV (postoperative nausea and vomiting)    Premature ventricular contraction    Status post placement of implantable loop recorder    Past Surgical History:  Procedure Laterality Date   ATRIAL FIBRILLATION ABLATION N/A 08/09/2022   Procedure: ATRIAL FIBRILLATION ABLATION;  Surgeon: Regan Lemming, MD;  [facility];  Service: Cardiovascular;  Laterality: N/A;   BREAST EXCISIONAL BIOPSY Left 1996   BREAST EXCISIONAL BIOPSY Right 1993   BREAST LUMPECTOMY     CATARACT EXTRACTION     COLONOSCOPY N/A 09/05/2015   Procedure: COLONOSCOPY;  Surgeon: West Bali, MD;  [facility];  Service: Endoscopy;  Laterality: N/A;  incomplete colonoscopy   COLONOSCOPY WITH PROPOFOL N/A 11/01/2015   [id number] hemorrhoids/five polyps/mild diverticulosis, tubular adenomas.  Next colonoscopy 3 years with propofol with overtube.   COLONOSCOPY WITH PROPOFOL N/A 11/02/2019   Procedure: COLONOSCOPY WITH PROPOFOL;  Surgeon: West Bali, MD;  [facility];  Service: Endoscopy;  Laterality: N/A;  2:00pm with overtube   EYE SURGERY  HYSTEROSCOPY WITH D & C N/A 12/31/2016   Procedure: DILATATION AND CURETTAGE /HYSTEROSCOPY, POSSIBLE REMOVAL OF ENDOMETRIAL LESION;  Surgeon: Tracey Harries, MD;  Location: [facility];  Service: Gynecology;  Laterality: N/A;   implantable loop recorder placement  09/28/2019   Medtronic Reveal [place] model LNQ22 ([place] [id number]) implantable loop recorder implanted by Dr Johney Frame in office    Maxiofacial     POLYPECTOMY  11/01/2015    Procedure: POLYPECTOMY;  Surgeon: West Bali, MD;  Location: [facility];  Service: Endoscopy;;   POLYPECTOMY  11/02/2019   Procedure: POLYPECTOMY;  Surgeon: West Bali, MD;  [facility];  Service: Endoscopy;;   RETINAL DETACHMENT SURGERY  1997  1999   TUBAL LIGATION      Current Outpatient Medications  Medication Sig Dispense Refill   acetaminophen (TYLENOL) 325 MG tablet Take 650 mg by mouth every 6 (six) hours as needed for mild pain.     albuterol (VENTOLIN HFA) 108 (90 Base) MCG/ACT inhaler Inhale 2 puffs into the lungs every 6 (six) hours as needed for wheezing or shortness of breath.      brimonidine (ALPHAGAN) 0.2 % ophthalmic solution Place 1 drop into both eyes in the morning and at bedtime.     calcium carbonate (TUMS - DOSED IN MG ELEMENTAL CALCIUM) 500 MG chewable tablet Chew 1 tablet by mouth daily as needed for heartburn. er     Calcium Carbonate-Vitamin D 600-400 MG-UNIT tablet Take 1 tablet by mouth 2 (two) times daily.     Carboxymethylcellulose Sodium (ARTIFICIAL TEARS OP) Place 1 drop into both eyes daily.     diltiazem (CARDIZEM) 30 MG tablet TAKE 1 TABLET BY MOUTH FOUR TIMES DAILY AS NEEDED 30 tablet 6   docusate sodium (COLACE) 100 MG capsule Take 100 mg by mouth daily as needed for mild constipation or moderate constipation.     latanoprost (XALATAN) 0.005 % ophthalmic solution Place 1 drop into both eyes at bedtime.     lisinopril-hydrochlorothiazide (PRINZIDE,ZESTORETIC) 20-25 MG tablet Take 1 tablet by mouth at bedtime.      meclizine (ANTIVERT) 25 MG tablet Take 25 mg by mouth 3 (three) times daily as needed for dizziness.     Melatonin 3 MG CAPS Take 3 mg by mouth at bedtime as needed (sleep).     metoprolol tartrate (LOPRESSOR) 50 MG tablet TAKE 1 TABLET BY MOUTH TWICE DAILY 60 tablet 6   MULTAQ 400 MG tablet TAKE 1 TABLET BY MOUTH TWICE DAILY with a meal 180 tablet 1   omeprazole (PRILOSEC) 40 MG capsule Take 40 mg by mouth 2 (two) times daily  with a meal.     simvastatin (ZOCOR) 10 MG tablet Take 1 tablet (10 mg total) by mouth at bedtime. 30 tablet 0   traZODone (DESYREL) 50 MG tablet Take 50 mg by mouth at bedtime as needed for sleep.     XARELTO 20 MG TABS tablet TAKE 1 TABLET BY MOUTH EVERY DAY WITH SUPPER 30 tablet 5   No current facility-administered medications for this encounter.    Allergies  Allergen Reactions   Augmentin [Amoxicillin-Pot Clavulanate] Diarrhea and Nausea Only   Ciprofloxacin Diarrhea and Nausea Only   Other     CANNOT RECEIVE AN MRI DUE TO METAL BAND (RIGHT EYE) DUE TO DETATCHED RETINA     Social History   Socioeconomic History   Marital status: Widowed    Spouse name: [name]   Number of children: 1   Years of education: Not  on file   Highest education level: Not on file  Occupational History   Occupation: [employer]    Employer: OTHER  Tobacco Use   Smoking status: Former    Packs/day: 1.00    Years: 10.00    Additional pack years: 0.00    Total pack years: 10.00    Types: Cigarettes    Start date: 12/17/1966    Quit date: 12/17/1976    Years since quitting: 46.4   Smokeless tobacco: Never   Tobacco comments:    Former smoker quit 53yrs ago  Vaping Use   Vaping Use: Never used  Substance and Sexual Activity   Alcohol use: No    Alcohol/week: 0.0 standard drinks of alcohol   Drug use: No   Sexual activity: Not on file  Other Topics Concern   Not on file  Social History Narrative   Pt gets regular exercise. 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 WORKED FOR [employer]. KIDS: [place], [place] WAS IN THE [employer].   Social Determinants of Health   Financial Resource Strain: Not on file  Food Insecurity: Not on file  Transportation Needs: Not on file  Physical Activity: Not on file  Stress: Not on file  Social Connections: Not on file  Intimate Partner Violence: Not on file     ROS- All systems are reviewed and negative  except as per the HPI above.  Physical Exam: Vitals:   05/01/23 1023  BP: 104/62  Pulse: 61  Weight: 77.3 kg  Height: 5\' 4"  (1.626 m)    GEN- The patient is a well appearing female, alert and oriented x 3 today.   HEENT-head normocephalic, atraumatic, sclera clear, conjunctiva pink, hearing intact, trachea midline. Lungs- Clear to ausculation bilaterally, normal work of breathing Heart- Regular rate and rhythm, no murmurs, rubs or gallops  GI- soft, NT, ND, + BS Extremities- no clubbing, cyanosis, trace edema MS- no significant deformity or atrophy Skin- no rash or lesion Psych- euthymic mood, full affect Neuro- strength and sensation are intact   Wt Readings from Last 3 Encounters:  05/01/23 77.3 kg  10/31/22 77.6 kg  08/31/22 76.7 kg    EKG today demonstrates  SR, 1st degree AV block Vent. rate 61 BPM PR interval 222 ms QRS duration 76 ms QT/QTcB 466/469 ms  Echo 11/30/15 demonstrated  - Left ventricle: The cavity size was normal. There was mild    concentric hypertrophy. Systolic function was normal. The    estimated ejection fraction was in the range of 60% to 65%. Wall    motion was normal; there were no regional wall motion    abnormalities. Left ventricular diastolic function parameters    were normal.  - Aortic valve: Trileaflet; normal thickness leaflets. There was no    regurgitation.  - Aortic root: The aortic root was normal in size.  - Ascending aorta: The ascending aorta was normal in size.  - Mitral valve: Structurally normal valve. There was no    regurgitation.  - Left atrium: The atrium was normal in size.  - Right ventricle: The cavity size was normal. Wall thickness was    normal. Systolic function was normal.  - Right atrium: The atrium was normal in size.  - Tricuspid valve: There was trivial regurgitation.  - Pulmonic valve: There was no regurgitation.  - Pulmonary arteries: Systolic pressure was within the normal    range.  - Inferior  vena cava: The vessel was  normal in size.  - Pericardium, extracardiac: There was no pericardial effusion.   Impressions:   - Normal study.   Epic records are reviewed at length today  CHA2DS2-VASc Score = 4  The patient's score is based upon: CHF History: 0 HTN History: 1 Diabetes History: 0 Stroke History: 0 Vascular Disease History: 1 Age Score: 1 Gender Score: 1       ASSESSMENT AND PLAN: 1. Paroxysmal Atrial Fibrillation (ICD10:  I48.0) The patient's CHA2DS2-VASc score is 4, indicating a 4.8% annual risk of stroke. S/p afib ablation 08/09/22 ILR continues to show 0% afib burden Continue Multaq 400 mg BID. Patient has opted to continue this for now. Continue diltiazem 120 mg daily with 30 mg PRN q 4 hours for heart racing. Continue Xarelto 20 mg daily  Continue Lopressor 50 mg BID  2. Secondary Hypercoagulable State (ICD10:  D68.69) The patient is at significant risk for stroke/thromboembolism based upon her CHA2DS2-VASc Score of 4.  Continue Rivaroxaban (Xarelto).   3. HTN Stable, no changes today.  4. Lower extremity edema Trace edema on exam today, subjectively worse at home at the end of the day. Could be from CCB. Will check BNP today.    Follow up in the AF clinic in 6 months.    Jorja Loa PA-C Afib Clinic [facility] [address] [place], [place] [address] [phone] 05/01/2023 10:43 AM

## 2023-05-06 ENCOUNTER — Ambulatory Visit (INDEPENDENT_AMBULATORY_CARE_PROVIDER_SITE_OTHER): Payer: Medicare Other

## 2023-05-06 DIAGNOSIS — R002 Palpitations: Secondary | ICD-10-CM | POA: Diagnosis not present

## 2023-05-06 LAB — CUP PACEART REMOTE DEVICE CHECK
Date Time Interrogation Session: 20240517230015
Implantable Pulse Generator Implant Date: 20201012

## 2023-05-06 NOTE — Progress Notes (Signed)
Carelink Summary Report / Loop Recorder 

## 2023-05-16 ENCOUNTER — Other Ambulatory Visit (HOSPITAL_COMMUNITY): Payer: Self-pay | Admitting: *Deleted

## 2023-05-16 MED ORDER — MULTAQ 400 MG PO TABS: 400.0000 mg | ORAL_TABLET | Freq: Two times a day (BID) | ORAL | 2 refills | Status: DC

## 2023-06-03 NOTE — Progress Notes (Signed)
Carelink Summary Report / Loop Recorder 

## 2023-06-04 ENCOUNTER — Other Ambulatory Visit (HOSPITAL_COMMUNITY): Payer: Self-pay | Admitting: Physician Assistant

## 2023-06-10 ENCOUNTER — Ambulatory Visit (INDEPENDENT_AMBULATORY_CARE_PROVIDER_SITE_OTHER): Payer: Medicare Other

## 2023-06-10 DIAGNOSIS — R002 Palpitations: Secondary | ICD-10-CM

## 2023-06-10 LAB — CUP PACEART REMOTE DEVICE CHECK
Date Time Interrogation Session: 20240623230059
Implantable Pulse Generator Implant Date: 20201012

## 2023-06-12 DIAGNOSIS — I10 Essential (primary) hypertension: Secondary | ICD-10-CM | POA: Diagnosis not present

## 2023-06-12 DIAGNOSIS — E119 Type 2 diabetes mellitus without complications: Secondary | ICD-10-CM | POA: Diagnosis not present

## 2023-06-12 DIAGNOSIS — R7301 Impaired fasting glucose: Secondary | ICD-10-CM | POA: Diagnosis not present

## 2023-06-12 DIAGNOSIS — E7849 Other hyperlipidemia: Secondary | ICD-10-CM | POA: Diagnosis not present

## 2023-06-12 DIAGNOSIS — E782 Mixed hyperlipidemia: Secondary | ICD-10-CM | POA: Diagnosis not present

## 2023-06-17 DIAGNOSIS — H401131 Primary open-angle glaucoma, bilateral, mild stage: Secondary | ICD-10-CM | POA: Diagnosis not present

## 2023-06-19 DIAGNOSIS — E1129 Type 2 diabetes mellitus with other diabetic kidney complication: Secondary | ICD-10-CM | POA: Diagnosis not present

## 2023-06-19 DIAGNOSIS — E1165 Type 2 diabetes mellitus with hyperglycemia: Secondary | ICD-10-CM | POA: Diagnosis not present

## 2023-06-19 DIAGNOSIS — I10 Essential (primary) hypertension: Secondary | ICD-10-CM | POA: Diagnosis not present

## 2023-06-19 DIAGNOSIS — R5383 Other fatigue: Secondary | ICD-10-CM | POA: Diagnosis not present

## 2023-06-19 DIAGNOSIS — Z23 Encounter for immunization: Secondary | ICD-10-CM | POA: Diagnosis not present

## 2023-06-19 DIAGNOSIS — Z0001 Encounter for general adult medical examination with abnormal findings: Secondary | ICD-10-CM | POA: Diagnosis not present

## 2023-06-19 DIAGNOSIS — Z1389 Encounter for screening for other disorder: Secondary | ICD-10-CM | POA: Diagnosis not present

## 2023-06-19 DIAGNOSIS — I480 Paroxysmal atrial fibrillation: Secondary | ICD-10-CM | POA: Diagnosis not present

## 2023-06-19 DIAGNOSIS — R7301 Impaired fasting glucose: Secondary | ICD-10-CM | POA: Diagnosis not present

## 2023-06-19 DIAGNOSIS — Z1331 Encounter for screening for depression: Secondary | ICD-10-CM | POA: Diagnosis not present

## 2023-06-19 DIAGNOSIS — R519 Headache, unspecified: Secondary | ICD-10-CM | POA: Diagnosis not present

## 2023-06-19 DIAGNOSIS — R42 Dizziness and giddiness: Secondary | ICD-10-CM | POA: Diagnosis not present

## 2023-06-28 NOTE — Progress Notes (Signed)
Carelink Summary Report / Loop Recorder 

## 2023-06-30 DIAGNOSIS — E663 Overweight: Secondary | ICD-10-CM | POA: Diagnosis not present

## 2023-06-30 DIAGNOSIS — H1033 Unspecified acute conjunctivitis, bilateral: Secondary | ICD-10-CM | POA: Diagnosis not present

## 2023-06-30 DIAGNOSIS — Z6829 Body mass index (BMI) 29.0-29.9, adult: Secondary | ICD-10-CM | POA: Diagnosis not present

## 2023-07-15 ENCOUNTER — Ambulatory Visit (INDEPENDENT_AMBULATORY_CARE_PROVIDER_SITE_OTHER): Payer: Medicare Other

## 2023-07-15 DIAGNOSIS — R002 Palpitations: Secondary | ICD-10-CM | POA: Diagnosis not present

## 2023-07-30 DIAGNOSIS — L570 Actinic keratosis: Secondary | ICD-10-CM | POA: Diagnosis not present

## 2023-07-31 NOTE — Progress Notes (Signed)
Carelink Summary Report / Loop Recorder 

## 2023-08-08 DIAGNOSIS — H401131 Primary open-angle glaucoma, bilateral, mild stage: Secondary | ICD-10-CM | POA: Diagnosis not present

## 2023-08-15 ENCOUNTER — Ambulatory Visit (INDEPENDENT_AMBULATORY_CARE_PROVIDER_SITE_OTHER): Payer: Medicare Other

## 2023-08-15 DIAGNOSIS — I480 Paroxysmal atrial fibrillation: Secondary | ICD-10-CM | POA: Diagnosis not present

## 2023-08-15 LAB — CUP PACEART REMOTE DEVICE CHECK
Date Time Interrogation Session: 20240828230042
Implantable Pulse Generator Implant Date: 20201012

## 2023-08-23 NOTE — Progress Notes (Signed)
Carelink Summary Report / Loop Recorder 

## 2023-09-17 ENCOUNTER — Ambulatory Visit: Payer: Medicare Other

## 2023-09-17 DIAGNOSIS — R002 Palpitations: Secondary | ICD-10-CM | POA: Diagnosis not present

## 2023-09-17 LAB — CUP PACEART REMOTE DEVICE CHECK
Date Time Interrogation Session: 20240930231130
Implantable Pulse Generator Implant Date: 20201012

## 2023-09-30 DIAGNOSIS — H401131 Primary open-angle glaucoma, bilateral, mild stage: Secondary | ICD-10-CM | POA: Diagnosis not present

## 2023-10-02 NOTE — Progress Notes (Signed)
Carelink Summary Report / Loop Recorder 

## 2023-10-15 DIAGNOSIS — I10 Essential (primary) hypertension: Secondary | ICD-10-CM | POA: Diagnosis not present

## 2023-10-15 DIAGNOSIS — R5383 Other fatigue: Secondary | ICD-10-CM | POA: Diagnosis not present

## 2023-10-15 DIAGNOSIS — E7849 Other hyperlipidemia: Secondary | ICD-10-CM | POA: Diagnosis not present

## 2023-10-15 DIAGNOSIS — E1165 Type 2 diabetes mellitus with hyperglycemia: Secondary | ICD-10-CM | POA: Diagnosis not present

## 2023-10-15 DIAGNOSIS — C44629 Squamous cell carcinoma of skin of left upper limb, including shoulder: Secondary | ICD-10-CM | POA: Diagnosis not present

## 2023-10-15 DIAGNOSIS — K219 Gastro-esophageal reflux disease without esophagitis: Secondary | ICD-10-CM | POA: Diagnosis not present

## 2023-10-21 ENCOUNTER — Ambulatory Visit (INDEPENDENT_AMBULATORY_CARE_PROVIDER_SITE_OTHER): Payer: Medicare Other

## 2023-10-21 DIAGNOSIS — R002 Palpitations: Secondary | ICD-10-CM

## 2023-10-21 LAB — CUP PACEART REMOTE DEVICE CHECK
Date Time Interrogation Session: 20241103054034
Implantable Pulse Generator Implant Date: 20201012

## 2023-10-22 ENCOUNTER — Other Ambulatory Visit: Payer: Self-pay | Admitting: Physician Assistant

## 2023-10-22 DIAGNOSIS — R7301 Impaired fasting glucose: Secondary | ICD-10-CM | POA: Diagnosis not present

## 2023-10-22 DIAGNOSIS — K219 Gastro-esophageal reflux disease without esophagitis: Secondary | ICD-10-CM | POA: Diagnosis not present

## 2023-10-22 DIAGNOSIS — E1129 Type 2 diabetes mellitus with other diabetic kidney complication: Secondary | ICD-10-CM | POA: Diagnosis not present

## 2023-10-22 DIAGNOSIS — R519 Headache, unspecified: Secondary | ICD-10-CM | POA: Diagnosis not present

## 2023-10-22 DIAGNOSIS — I480 Paroxysmal atrial fibrillation: Secondary | ICD-10-CM

## 2023-10-22 DIAGNOSIS — R5383 Other fatigue: Secondary | ICD-10-CM | POA: Diagnosis not present

## 2023-10-22 DIAGNOSIS — I10 Essential (primary) hypertension: Secondary | ICD-10-CM | POA: Diagnosis not present

## 2023-10-22 DIAGNOSIS — E7849 Other hyperlipidemia: Secondary | ICD-10-CM | POA: Diagnosis not present

## 2023-10-22 DIAGNOSIS — R42 Dizziness and giddiness: Secondary | ICD-10-CM | POA: Diagnosis not present

## 2023-10-22 DIAGNOSIS — Z23 Encounter for immunization: Secondary | ICD-10-CM | POA: Diagnosis not present

## 2023-10-22 DIAGNOSIS — E1165 Type 2 diabetes mellitus with hyperglycemia: Secondary | ICD-10-CM | POA: Diagnosis not present

## 2023-10-22 DIAGNOSIS — G4700 Insomnia, unspecified: Secondary | ICD-10-CM | POA: Diagnosis not present

## 2023-10-30 DIAGNOSIS — R300 Dysuria: Secondary | ICD-10-CM | POA: Diagnosis not present

## 2023-10-30 DIAGNOSIS — R319 Hematuria, unspecified: Secondary | ICD-10-CM | POA: Diagnosis not present

## 2023-10-30 DIAGNOSIS — Z6828 Body mass index (BMI) 28.0-28.9, adult: Secondary | ICD-10-CM | POA: Diagnosis not present

## 2023-10-30 DIAGNOSIS — N390 Urinary tract infection, site not specified: Secondary | ICD-10-CM | POA: Diagnosis not present

## 2023-10-31 ENCOUNTER — Ambulatory Visit (HOSPITAL_COMMUNITY)
Admission: RE | Admit: 2023-10-31 | Discharge: 2023-10-31 | Disposition: A | Discharge status: Home / Self Care | Payer: Medicare Other | Source: Ambulatory Visit | Attending: Physician Assistant | Admitting: Physician Assistant

## 2023-10-31 ENCOUNTER — Encounter (HOSPITAL_COMMUNITY): Payer: Self-pay | Admitting: Physician Assistant

## 2023-10-31 VITALS — BP 112/72 | HR 66 | Ht 64.0 in | Wt 168.8 lb

## 2023-10-31 DIAGNOSIS — I10 Essential (primary) hypertension: Secondary | ICD-10-CM | POA: Insufficient documentation

## 2023-10-31 DIAGNOSIS — R9431 Abnormal electrocardiogram [ECG] [EKG]: Secondary | ICD-10-CM | POA: Diagnosis not present

## 2023-10-31 DIAGNOSIS — Z5181 Encounter for therapeutic drug level monitoring: Secondary | ICD-10-CM | POA: Diagnosis not present

## 2023-10-31 DIAGNOSIS — Z7901 Long term (current) use of anticoagulants: Secondary | ICD-10-CM | POA: Diagnosis not present

## 2023-10-31 DIAGNOSIS — Z79899 Other long term (current) drug therapy: Secondary | ICD-10-CM

## 2023-10-31 DIAGNOSIS — D6869 Other thrombophilia: Secondary | ICD-10-CM

## 2023-10-31 DIAGNOSIS — I480 Paroxysmal atrial fibrillation: Secondary | ICD-10-CM | POA: Diagnosis not present

## 2023-10-31 DIAGNOSIS — I2510 Atherosclerotic heart disease of native coronary artery without angina pectoris: Secondary | ICD-10-CM | POA: Diagnosis not present

## 2023-10-31 NOTE — Progress Notes (Signed)
Primary Care Physician: Estanislado Pandy, MD Primary Electrophysiologist: Dr Elberta Fortis  Referring Physician: Device clinic/Renee Keitha Butte PA   Heather Cunningham is a 72 y.o. female with a history of paroxysmal atrial fibrillation and HTN who presents for follow up in the [facility].  The patient was initially diagnosed with atrial fibrillation remotely and has been maintained on PRN diltiazem. Patient is on Xarelto for a CHADS2VASC score of 3. She underwent ILR implant on 09/2019 for afib management. She was started on Multaq which did well controlling her afib. She saw Dr Elberta Fortis to discuss ablation in order to get off AAD.   Patient is s/p afib ablation with Dr Elberta Fortis on 08/09/22.   On follow up today, patient reports that she has done well from an afib standpoint. ILR continues to show 0% afib burden. However, she has noticed dizziness when she first gets up in the morning. Her symptoms improve after she has been up moving around.   Today, she denies symptoms of palpitations, chest pain, shortness of breath, orthopnea, PND, presyncope, syncope, bleeding, or neurologic sequela. The patient is tolerating medications without difficulties and is otherwise without complaint today.    Atrial Fibrillation Risk Factors:  she does have symptoms or diagnosis of sleep apnea. she does not have a history of rheumatic fever. she does not have a history of alcohol use. The patient does not have a history of early familial atrial fibrillation or other arrhythmias.   Atrial Fibrillation Management history:  Previous antiarrhythmic drugs: Multaq Previous cardioversions: none Previous ablations: 08/09/22 Anticoagulation history: Xarelto   Past Medical History:  Diagnosis Date   Arthritis    back   Asthma    reactive airway around cats and dogs   Bilateral carpal tunnel syndrome    Cancer (HCC)    Skin   GERD (gastroesophageal reflux disease)    Glaucoma    Hypertension     Paroxysmal atrial fibrillation (HCC)    PONV (postoperative nausea and vomiting)    Premature ventricular contraction    Status post placement of implantable loop recorder     Current Outpatient Medications  Medication Sig Dispense Refill   acetaminophen (TYLENOL) 325 MG tablet Take 650 mg by mouth every 6 (six) hours as needed for mild pain.     albuterol (VENTOLIN HFA) 108 (90 Base) MCG/ACT inhaler Inhale 2 puffs into the lungs every 6 (six) hours as needed for wheezing or shortness of breath.      calcium carbonate (TUMS - DOSED IN MG ELEMENTAL CALCIUM) 500 MG chewable tablet Chew 1 tablet by mouth daily as needed for heartburn. er     Calcium Carbonate-Vitamin D 600-400 MG-UNIT tablet Take 1 tablet by mouth 2 (two) times daily.     Carboxymethylcellulose Sodium (ARTIFICIAL TEARS OP) Place 1 drop into both eyes daily.     diltiazem (CARDIZEM) 30 MG tablet TAKE 1 TABLET BY MOUTH FOUR TIMES DAILY AS NEEDED 30 tablet 2   docusate sodium (COLACE) 100 MG capsule Take 100 mg by mouth daily as needed for mild constipation or moderate constipation.     dronedarone (MULTAQ) 400 MG tablet Take 1 tablet (400 mg total) by mouth 2 (two) times daily with a meal. 180 tablet 2   latanoprost (XALATAN) 0.005 % ophthalmic solution Place 1 drop into both eyes at bedtime.     lisinopril-hydrochlorothiazide (PRINZIDE,ZESTORETIC) 20-25 MG tablet Take 1 tablet by mouth at bedtime.      meclizine (ANTIVERT) 25  MG tablet Take 25 mg by mouth 3 (three) times daily as needed for dizziness.     Melatonin 3 MG CAPS Take 3 mg by mouth at bedtime as needed (sleep).     metoprolol tartrate (LOPRESSOR) 50 MG tablet TAKE 1 TABLET BY MOUTH TWICE DAILY 60 tablet 6   omeprazole (PRILOSEC) 40 MG capsule Take 40 mg by mouth 2 (two) times daily with a meal.     rivaroxaban (XARELTO) 20 MG TABS tablet TAKE 1 TABLET BY MOUTH EVERY DAY WITH SUPPER 30 tablet 11   Semaglutide (OZEMPIC, 0.25 OR 0.5 MG/DOSE, [place]) Inject into the skin.      simvastatin (ZOCOR) 10 MG tablet Take 1 tablet (10 mg total) by mouth at bedtime. 30 tablet 0   sulfamethoxazole-trimethoprim (BACTRIM DS) 800-160 MG tablet      traZODone (DESYREL) 50 MG tablet Take 50 mg by mouth at bedtime as needed for sleep.     No current facility-administered medications for this encounter.    ROS- All systems are reviewed and negative except as per the HPI above.  Physical Exam: Vitals:   10/31/23 1044  BP: 112/72  Pulse: 66  Weight: 76.6 kg  Height: 5\' 4"  (1.626 m)     GEN: Well nourished, well developed in no acute distress NECK: No JVD; No carotid bruits CARDIAC: Regular rate and rhythm, no murmurs, rubs, gallops RESPIRATORY:  Clear to auscultation without rales, wheezing or rhonchi  ABDOMEN: Soft, non-tender, non-distended EXTREMITIES:  No edema; No deformity    Wt Readings from Last 3 Encounters:  10/31/23 76.6 kg  05/01/23 77.3 kg  10/31/22 77.6 kg    EKG today demonstrates  SR, 1st degree AV block Vent. rate 66 BPM PR interval 222 ms QRS duration 86 ms QT/QTcB 466/488 ms  Echo 11/30/15 demonstrated  - Left ventricle: The cavity size was normal. There was mild    concentric hypertrophy. Systolic function was normal. The    estimated ejection fraction was in the range of 60% to 65%. Wall    motion was normal; there were no regional wall motion    abnormalities. Left ventricular diastolic function parameters    were normal.  - Aortic valve: Trileaflet; normal thickness leaflets. There was no    regurgitation.  - Aortic root: The aortic root was normal in size.  - Ascending aorta: The ascending aorta was normal in size.  - Mitral valve: Structurally normal valve. There was no    regurgitation.  - Left atrium: The atrium was normal in size.  - Right ventricle: The cavity size was normal. Wall thickness was    normal. Systolic function was normal.  - Right atrium: The atrium was normal in size.  - Tricuspid valve: There was trivial  regurgitation.  - Pulmonic valve: There was no regurgitation.  - Pulmonary arteries: Systolic pressure was within the normal    range.  - Inferior vena cava: The vessel was normal in size.  - Pericardium, extracardiac: There was no pericardial effusion.   Impressions:   - Normal study.   Epic records are reviewed at length today  CHA2DS2-VASc Score = 4  The patient's score is based upon: CHF History: 0 HTN History: 1 Diabetes History: 0 Stroke History: 0 Vascular Disease History: 1 Age Score: 1 Gender Score: 1       ASSESSMENT AND PLAN: Paroxysmal Atrial Fibrillation (ICD10:  I48.0) The patient's CHA2DS2-VASc score is 4, indicating a 4.8% annual risk of stroke. S/p afib ablation 08/09/22 ILR  continues to show 0% afib burden, no interim episodes. Continue Multaq 400 mg BID for now, patient plans to discontinue when she finishes this prescription.  Continue diltiazem 120 mg daily with 30 mg PRN q 4 hours for heart racing. Continue Xarelto 20 mg daily  Continue Lopressor 50 mg BID  Secondary Hypercoagulable State (ICD10:  D68.69) The patient is at significant risk for stroke/thromboembolism based upon her CHA2DS2-VASc Score of 4.  Continue Rivaroxaban (Xarelto).   HTN Stable on current regimen Patient will check her BP in the morning to see if hypotension could be causing her dizziness.   CAD CAC score 421 On statin No anginal symptoms    Follow up in the AF clinic in 6 months.    Jorja Loa PA-C [facility] [address] [place], [place] [address] [phone] 10/31/2023 10:59 AM

## 2023-11-07 DIAGNOSIS — R300 Dysuria: Secondary | ICD-10-CM | POA: Diagnosis not present

## 2023-11-12 NOTE — Progress Notes (Signed)
Carelink Summary Report / Loop Recorder 

## 2023-11-13 IMAGING — MG MM DIGITAL SCREENING BILAT W/ TOMO AND CAD
8 series · 8 of 24 positions shown · non-contrast
Comparison: Previous exam(s).

CLINICAL DATA: Screening.

EXAM:
DIGITAL SCREENING BILATERAL MAMMOGRAM WITH TOMOSYNTHESIS AND CAD
TECHNIQUE: Bilateral screening digital craniocaudal and mediolateral oblique
mammograms were obtained. Bilateral screening digital breast
tomosynthesis was performed. The images were evaluated with
computer-aided detection.

[L CC synth-2D]
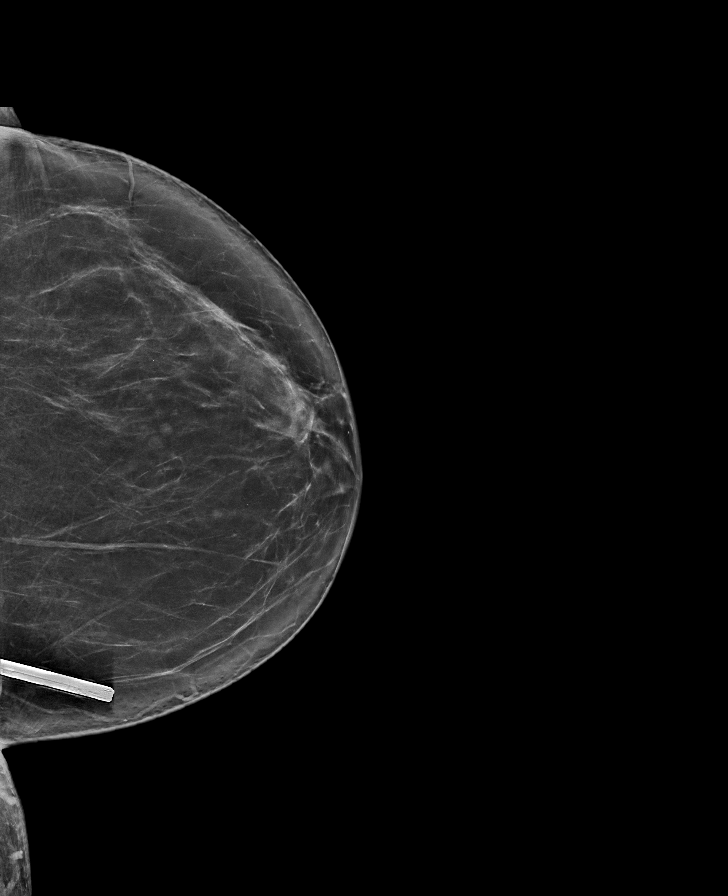

[R CC synth-2D]
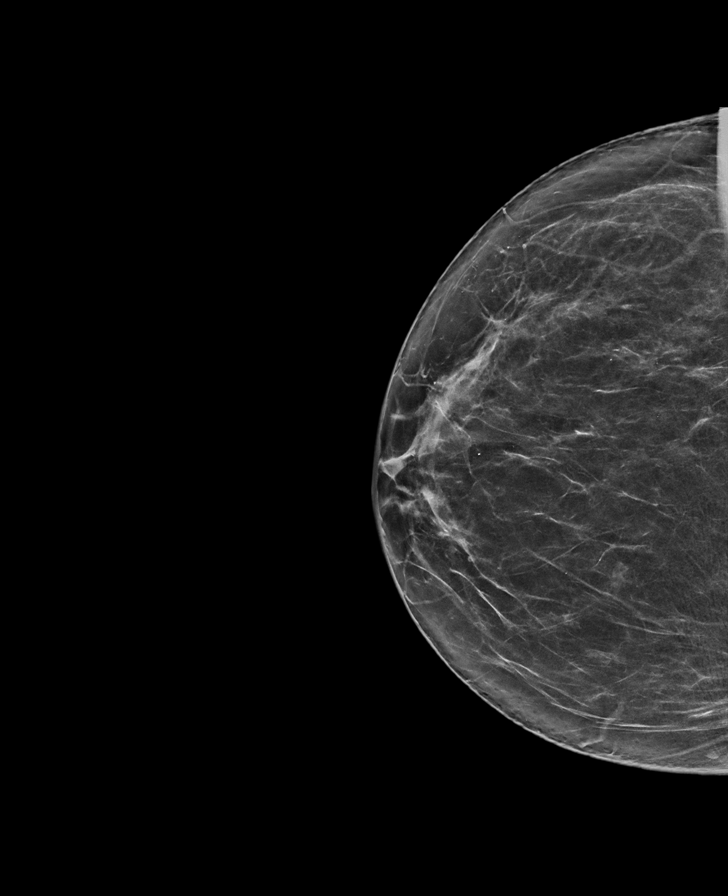

[L MLO synth-2D]
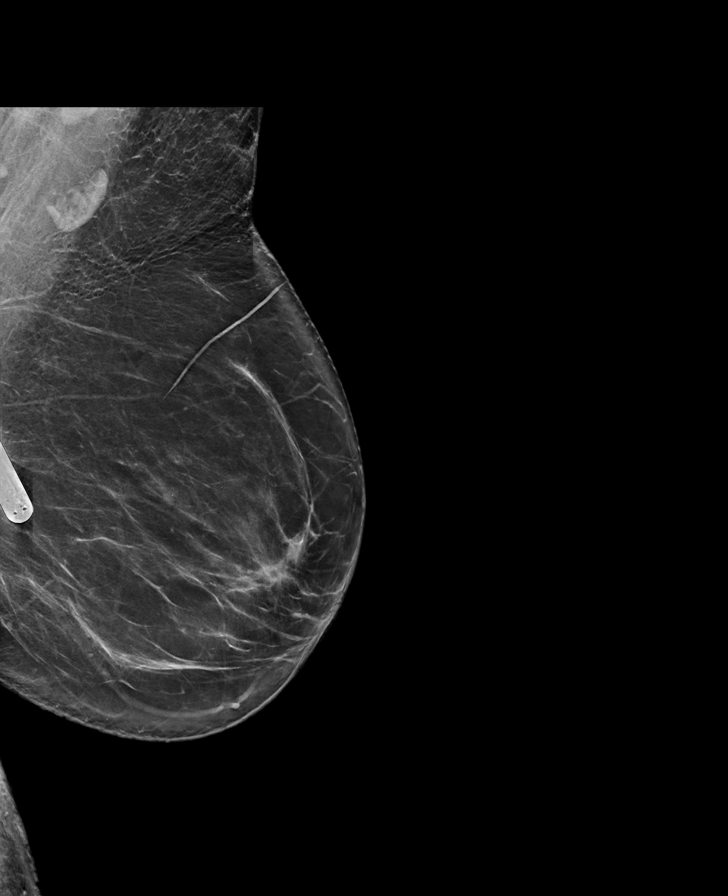

[R MLO synth-2D]
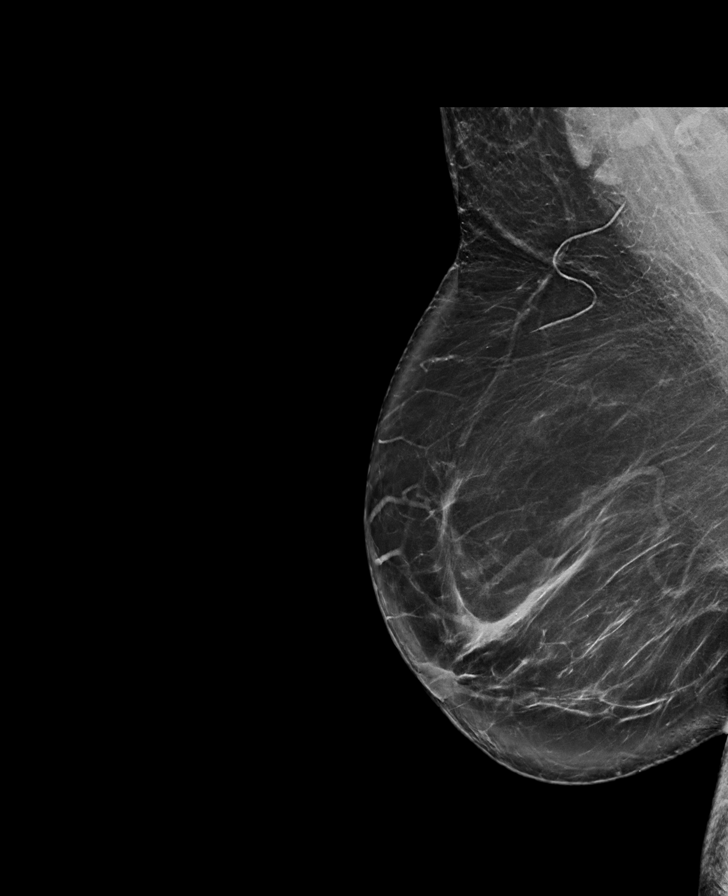

[R CC tomo · tomo slice 36/71.0]
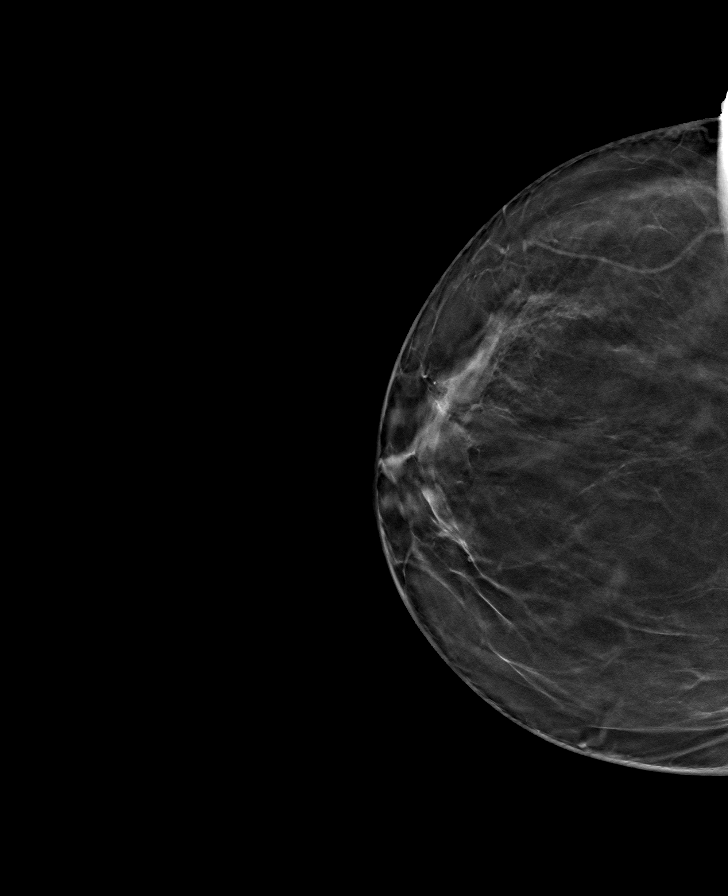

[L MLO tomo · tomo slice 44/87.0]
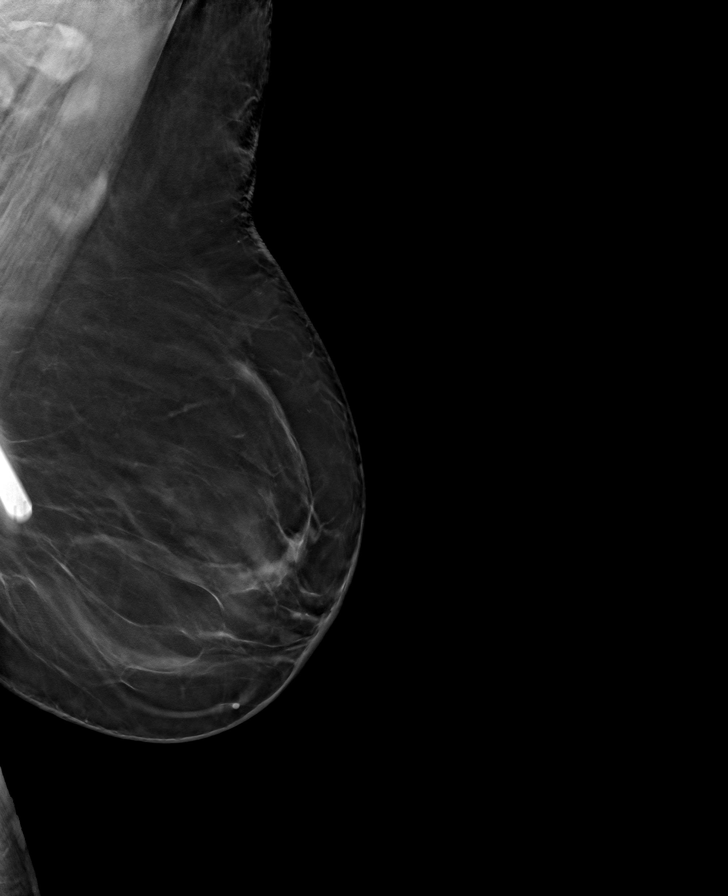

[R MLO tomo · tomo slice 48/95.0]
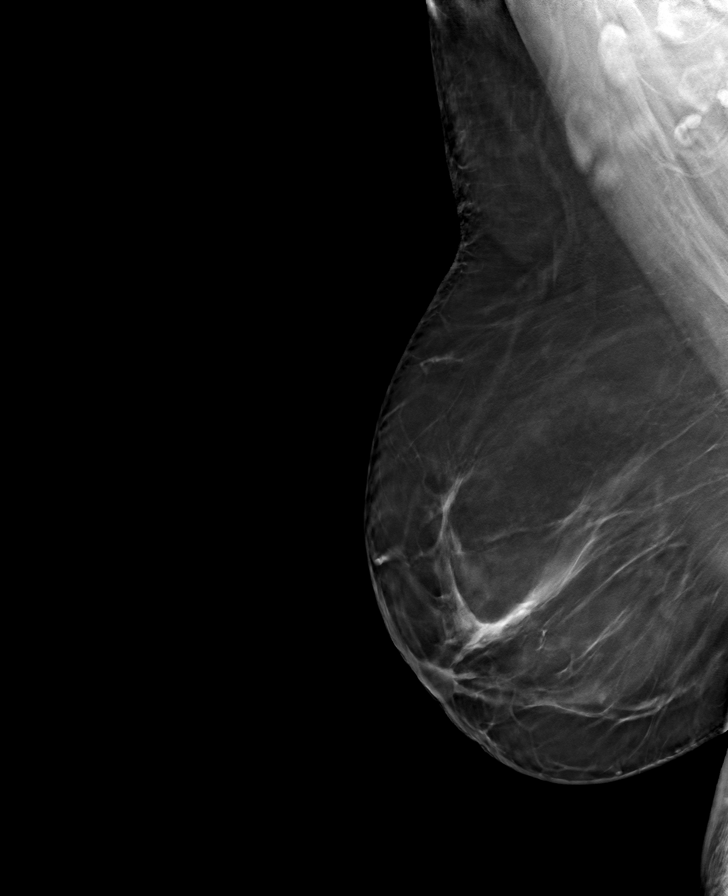

[L CC tomo · tomo slice 38/75.0]
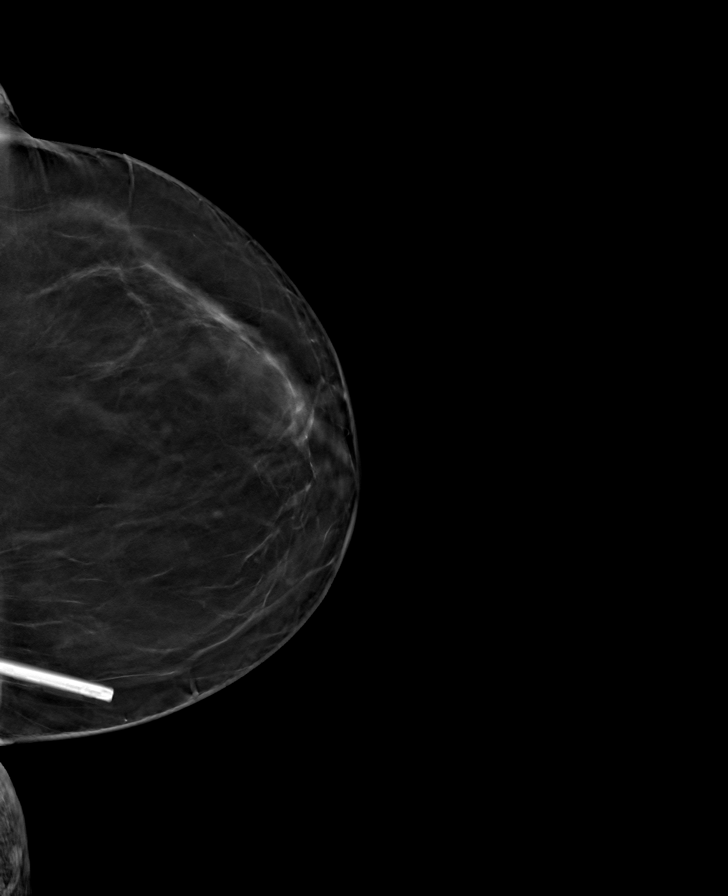

[8 of 24 positions shown; findings below may reference images not displayed]

ACR Breast Density Category b: There are scattered areas of
fibroglandular density.
FINDINGS: There are no findings suspicious for malignancy.
IMPRESSION: No mammographic evidence of malignancy. A result letter of this
screening mammogram will be mailed directly to the patient.

RECOMMENDATION:
Screening mammogram in one year. (Code:51-O-LD2)

BI-RADS CATEGORY  1: Negative.

## 2023-11-18 DIAGNOSIS — J0100 Acute maxillary sinusitis, unspecified: Secondary | ICD-10-CM | POA: Diagnosis not present

## 2023-11-18 DIAGNOSIS — Z20828 Contact with and (suspected) exposure to other viral communicable diseases: Secondary | ICD-10-CM | POA: Diagnosis not present

## 2023-11-18 DIAGNOSIS — R030 Elevated blood-pressure reading, without diagnosis of hypertension: Secondary | ICD-10-CM | POA: Diagnosis not present

## 2023-11-18 DIAGNOSIS — Z6828 Body mass index (BMI) 28.0-28.9, adult: Secondary | ICD-10-CM | POA: Diagnosis not present

## 2023-11-25 ENCOUNTER — Ambulatory Visit: Payer: Medicare Other

## 2023-11-25 DIAGNOSIS — R002 Palpitations: Secondary | ICD-10-CM | POA: Diagnosis not present

## 2023-11-25 LAB — CUP PACEART REMOTE DEVICE CHECK
Date Time Interrogation Session: 20241208230302
Implantable Pulse Generator Implant Date: 20201012

## 2023-12-30 ENCOUNTER — Ambulatory Visit (INDEPENDENT_AMBULATORY_CARE_PROVIDER_SITE_OTHER): Payer: Medicare Other

## 2023-12-30 DIAGNOSIS — R002 Palpitations: Secondary | ICD-10-CM | POA: Diagnosis not present

## 2023-12-30 LAB — CUP PACEART REMOTE DEVICE CHECK
Date Time Interrogation Session: 20250112230247
Implantable Pulse Generator Implant Date: 20201012

## 2024-01-02 DIAGNOSIS — E663 Overweight: Secondary | ICD-10-CM | POA: Diagnosis not present

## 2024-01-02 DIAGNOSIS — R030 Elevated blood-pressure reading, without diagnosis of hypertension: Secondary | ICD-10-CM | POA: Diagnosis not present

## 2024-01-02 DIAGNOSIS — Z6828 Body mass index (BMI) 28.0-28.9, adult: Secondary | ICD-10-CM | POA: Diagnosis not present

## 2024-01-02 DIAGNOSIS — A46 Erysipelas: Secondary | ICD-10-CM | POA: Diagnosis not present

## 2024-01-02 NOTE — Progress Notes (Signed)
Carelink Summary Report / Loop Recorder 

## 2024-01-09 ENCOUNTER — Other Ambulatory Visit: Payer: Self-pay | Admitting: Family Medicine

## 2024-01-09 DIAGNOSIS — Z1231 Encounter for screening mammogram for malignant neoplasm of breast: Secondary | ICD-10-CM

## 2024-01-13 DIAGNOSIS — Z6827 Body mass index (BMI) 27.0-27.9, adult: Secondary | ICD-10-CM | POA: Diagnosis not present

## 2024-01-13 DIAGNOSIS — R197 Diarrhea, unspecified: Secondary | ICD-10-CM | POA: Diagnosis not present

## 2024-01-13 DIAGNOSIS — R112 Nausea with vomiting, unspecified: Secondary | ICD-10-CM | POA: Diagnosis not present

## 2024-02-03 ENCOUNTER — Ambulatory Visit (INDEPENDENT_AMBULATORY_CARE_PROVIDER_SITE_OTHER): Payer: Medicare Other

## 2024-02-03 DIAGNOSIS — R002 Palpitations: Secondary | ICD-10-CM | POA: Diagnosis not present

## 2024-02-04 LAB — CUP PACEART REMOTE DEVICE CHECK
Date Time Interrogation Session: 20250216230143
Implantable Pulse Generator Implant Date: 20201012

## 2024-02-07 ENCOUNTER — Ambulatory Visit
Admission: RE | Admit: 2024-02-07 | Discharge: 2024-02-07 | Disposition: A | Discharge status: Home / Self Care | Payer: Medicare Other | Source: Ambulatory Visit | Attending: Family Medicine | Admitting: Family Medicine

## 2024-02-07 DIAGNOSIS — Z1231 Encounter for screening mammogram for malignant neoplasm of breast: Secondary | ICD-10-CM

## 2024-02-11 NOTE — Progress Notes (Signed)
 [facility] Summary Report / Loop Recorder

## 2024-02-13 DIAGNOSIS — H401131 Primary open-angle glaucoma, bilateral, mild stage: Secondary | ICD-10-CM | POA: Diagnosis not present

## 2024-02-19 DIAGNOSIS — E1165 Type 2 diabetes mellitus with hyperglycemia: Secondary | ICD-10-CM | POA: Diagnosis not present

## 2024-02-19 DIAGNOSIS — E7849 Other hyperlipidemia: Secondary | ICD-10-CM | POA: Diagnosis not present

## 2024-02-19 DIAGNOSIS — K219 Gastro-esophageal reflux disease without esophagitis: Secondary | ICD-10-CM | POA: Diagnosis not present

## 2024-02-19 DIAGNOSIS — I10 Essential (primary) hypertension: Secondary | ICD-10-CM | POA: Diagnosis not present

## 2024-02-19 DIAGNOSIS — R5383 Other fatigue: Secondary | ICD-10-CM | POA: Diagnosis not present

## 2024-02-19 DIAGNOSIS — E782 Mixed hyperlipidemia: Secondary | ICD-10-CM | POA: Diagnosis not present

## 2024-03-01 DIAGNOSIS — R112 Nausea with vomiting, unspecified: Secondary | ICD-10-CM | POA: Diagnosis not present

## 2024-03-01 DIAGNOSIS — N029 Recurrent and persistent hematuria with unspecified morphologic changes: Secondary | ICD-10-CM | POA: Diagnosis not present

## 2024-03-03 DIAGNOSIS — R300 Dysuria: Secondary | ICD-10-CM | POA: Diagnosis not present

## 2024-03-03 DIAGNOSIS — Z6828 Body mass index (BMI) 28.0-28.9, adult: Secondary | ICD-10-CM | POA: Diagnosis not present

## 2024-03-03 DIAGNOSIS — R310 Gross hematuria: Secondary | ICD-10-CM | POA: Diagnosis not present

## 2024-03-06 DIAGNOSIS — R310 Gross hematuria: Secondary | ICD-10-CM | POA: Diagnosis not present

## 2024-03-09 ENCOUNTER — Ambulatory Visit (INDEPENDENT_AMBULATORY_CARE_PROVIDER_SITE_OTHER): Payer: Medicare Other

## 2024-03-09 DIAGNOSIS — Z6828 Body mass index (BMI) 28.0-28.9, adult: Secondary | ICD-10-CM | POA: Diagnosis not present

## 2024-03-09 DIAGNOSIS — E782 Mixed hyperlipidemia: Secondary | ICD-10-CM | POA: Diagnosis not present

## 2024-03-09 DIAGNOSIS — R002 Palpitations: Secondary | ICD-10-CM

## 2024-03-09 DIAGNOSIS — R310 Gross hematuria: Secondary | ICD-10-CM | POA: Diagnosis not present

## 2024-03-09 DIAGNOSIS — N393 Stress incontinence (female) (male): Secondary | ICD-10-CM | POA: Diagnosis not present

## 2024-03-09 DIAGNOSIS — I10 Essential (primary) hypertension: Secondary | ICD-10-CM | POA: Diagnosis not present

## 2024-03-09 LAB — CUP PACEART REMOTE DEVICE CHECK
Date Time Interrogation Session: 20250323230054
Implantable Pulse Generator Implant Date: 20201012

## 2024-03-11 NOTE — Progress Notes (Signed)
 [facility] Summary Report / Loop Recorder

## 2024-03-11 NOTE — Addendum Note (Signed)
 Addended by: Geralyn Flash D on: 03/11/2024 03:44 PM   Modules accepted: Orders

## 2024-03-17 ENCOUNTER — Telehealth: Payer: Self-pay

## 2024-03-17 DIAGNOSIS — Z87891 Personal history of nicotine dependence: Secondary | ICD-10-CM | POA: Insufficient documentation

## 2024-03-17 NOTE — Telephone Encounter (Signed)
 Called patient PCP to get patient's urine and culture results. Staff state's she will fax over records.   "New pt coming Wednesday. Please request from referring provider / PCP:  - Urine testing results from her visits with Dr. Leandrew Koyanagi on 10/30/2023 and 11/07/2023 (seen for UTI / dysuria concerns on those dates)  - Urinalysis / urine micro results from 03/03/2024. The scanned referral notes include that however the results are blank (they all just look like this: "------"). I know they lost the urine culture from that day but would like to see the UA / micro. "

## 2024-03-17 NOTE — Progress Notes (Deleted)
 Name: Heather Cunningham DOB: 1951-07-14 MRN: [id number]  History of Present Illness: Heather Cunningham is a 73 y.o. female who presents today as a new patient at [facility] .  ***She is accompanied by ***. GU History includes: 1. Stress urinary incontinence.  She reports chief complaint of gross hematuria.   Per chart review / referral notes:  > 02/19/2024: Creatinine 0.74, GFR 84  > 03/01/2024: Seen at urgent care for hematuria (record not available for review today).  > 03/03/2024: Seen by PCP for gross hematuria, nausea, vomiting, and diarrhea. No associated dysuria or increased urinary urgency / frequency. Keflex 250 mg TID ordered for possible UTI; urine culture was lost unfortunately.  > 03/06/2024: Normal RUS - no stones, masses, or hydronephrosis; bladder unremarkable.   > 03/09/2024: Seen by PCP for follow up. Hematuria resolved.   Today: She {Actions; denies-reports:120008} urinary urgency, frequency, dysuria, straining to void, or sensations of incomplete emptying. She {Actions; denies-reports:120008} ***abdominal or ***flank pain. She {Actions; denies-reports:120008} fevers.  She {Actions; denies-reports:120008} prior history of gross hematuria.  She {Actions; denies-reports:120008} history of kidney stones.  She {Actions; denies-reports:120008} history of pyelonephritis.  She {Actions; denies-reports:120008} history of recent or recurrent UTI. She {Actions; denies-reports:120008} history of GU malignancy or pelvic radiation.  She {Actions; denies-reports:120008} history of autoimmune disease. She {Actions; denies-reports:120008} history of sickle cell disease or sickle cell trait. She reports history of smoking (quit >45 years ago; smoked *** ppd x*** years). She reports taking anticoagulants (Xarelto).  Medications: Current Outpatient Medications  Medication Sig Dispense Refill   acetaminophen (TYLENOL) 325 MG tablet Take 650 mg by mouth every 6 (six) hours as needed  for mild pain.     albuterol (VENTOLIN HFA) 108 (90 Base) MCG/ACT inhaler Inhale 2 puffs into the lungs every 6 (six) hours as needed for wheezing or shortness of breath.      calcium carbonate (TUMS - DOSED IN MG ELEMENTAL CALCIUM) 500 MG chewable tablet Chew 1 tablet by mouth daily as needed for heartburn. er     Calcium Carbonate-Vitamin D 600-400 MG-UNIT tablet Take 1 tablet by mouth 2 (two) times daily.     Carboxymethylcellulose Sodium (ARTIFICIAL TEARS OP) Place 1 drop into both eyes daily.     diltiazem (CARDIZEM) 30 MG tablet TAKE 1 TABLET BY MOUTH FOUR TIMES DAILY AS NEEDED 30 tablet 2   docusate sodium (COLACE) 100 MG capsule Take 100 mg by mouth daily as needed for mild constipation or moderate constipation.     dronedarone (MULTAQ) 400 MG tablet Take 1 tablet (400 mg total) by mouth 2 (two) times daily with a meal. 180 tablet 2   latanoprost (XALATAN) 0.005 % ophthalmic solution Place 1 drop into both eyes at bedtime.     lisinopril-hydrochlorothiazide (PRINZIDE,ZESTORETIC) 20-25 MG tablet Take 1 tablet by mouth at bedtime.      meclizine (ANTIVERT) 25 MG tablet Take 25 mg by mouth 3 (three) times daily as needed for dizziness.     Melatonin 3 MG CAPS Take 3 mg by mouth at bedtime as needed (sleep).     metoprolol tartrate (LOPRESSOR) 50 MG tablet TAKE 1 TABLET BY MOUTH TWICE DAILY 60 tablet 6   omeprazole (PRILOSEC) 40 MG capsule Take 40 mg by mouth 2 (two) times daily with a meal.     rivaroxaban (XARELTO) 20 MG TABS tablet TAKE 1 TABLET BY MOUTH EVERY DAY WITH SUPPER 30 tablet 11   Semaglutide (OZEMPIC, 0.25 OR 0.5 MG/DOSE, [place]) Inject into the skin.  simvastatin (ZOCOR) 10 MG tablet Take 1 tablet (10 mg total) by mouth at bedtime. 30 tablet 0   sulfamethoxazole-trimethoprim (BACTRIM DS) 800-160 MG tablet      traZODone (DESYREL) 50 MG tablet Take 50 mg by mouth at bedtime as needed for sleep.     No current facility-administered medications for this visit.     Allergies: Allergies  Allergen Reactions   Augmentin [Amoxicillin-Pot Clavulanate] Diarrhea and Nausea Only   Brimonidine     Other Reaction(s): Other (See Comments)  Really red - like conjunctivis   Ciprofloxacin Diarrhea and Nausea Only   Other     CANNOT RECEIVE AN MRI DUE TO METAL BAND (RIGHT EYE) DUE TO DETATCHED RETINA     Past Medical History:  Diagnosis Date   Arthritis    back   Asthma    reactive airway around cats and dogs   Bilateral carpal tunnel syndrome    Cancer (HCC)    Skin   GERD (gastroesophageal reflux disease)    Glaucoma    Hypertension    Paroxysmal atrial fibrillation (HCC)    PONV (postoperative nausea and vomiting)    Premature ventricular contraction    Status post placement of implantable loop recorder    Past Surgical History:  Procedure Laterality Date   ATRIAL FIBRILLATION ABLATION N/A 08/09/2022   Procedure: ATRIAL FIBRILLATION ABLATION;  Surgeon: Regan Lemming, MD;  Location: [facility];  Service: Cardiovascular;  Laterality: N/A;   BREAST EXCISIONAL BIOPSY Left 1996   BREAST EXCISIONAL BIOPSY Right 1993   BREAST LUMPECTOMY     CATARACT EXTRACTION     COLONOSCOPY N/A 09/05/2015   Procedure: COLONOSCOPY;  Surgeon: West Bali, MD;  Location: [facility];  Service: Endoscopy;  Laterality: N/A;  incomplete colonoscopy   COLONOSCOPY WITH PROPOFOL N/A 11/01/2015   XBJ:[id number] hemorrhoids/five polyps/mild diverticulosis, tubular adenomas.  Next colonoscopy 3 years with propofol with overtube.   COLONOSCOPY WITH PROPOFOL N/A 11/02/2019   Procedure: COLONOSCOPY WITH PROPOFOL;  Surgeon: West Bali, MD;  Location: [facility];  Service: Endoscopy;  Laterality: N/A;  2:00pm with overtube   EYE SURGERY     HYSTEROSCOPY WITH D & C N/A 12/31/2016   Procedure: DILATATION AND CURETTAGE /HYSTEROSCOPY, POSSIBLE REMOVAL OF ENDOMETRIAL LESION;  Surgeon: Tracey Harries, MD;  Location: [facility];  Service: Gynecology;  Laterality:  N/A;   implantable loop recorder placement  09/28/2019   Medtronic Reveal [place] model LNQ22 ([place] [id number]) implantable loop recorder implanted by Dr Johney Frame in office    Maxiofacial     POLYPECTOMY  11/01/2015   Procedure: POLYPECTOMY;  Surgeon: West Bali, MD;  Location: [facility];  Service: Endoscopy;;   POLYPECTOMY  11/02/2019   Procedure: POLYPECTOMY;  Surgeon: West Bali, MD;  Location: [facility];  Service: Endoscopy;;   RETINAL DETACHMENT SURGERY  1997  1999   TUBAL LIGATION     Family History  Problem Relation Age of Onset   Cancer Other    Heart failure Other    Colon cancer Mother    Colon cancer Maternal Grandmother    Colon polyps Neg Hx    Social History   Socioeconomic History   Marital status: Widowed    Spouse name: [name]   Number of children: 1   Years of education: Not on file   Highest education level: Not on file  Occupational History   Occupation: [employer]    Employer: OTHER  Tobacco Use  Smoking status: Former    Current packs/day: 0.00    Average packs/day: 1 pack/day for 10.0 years (10.0 ttl pk-yrs)    Types: Cigarettes    Start date: 12/17/1966    Quit date: 12/17/1976    Years since quitting: 47.2   Smokeless tobacco: Never   Tobacco comments:    Former smoker (quit 68yrs ago) 10/31/23  Vaping Use   Vaping status: Never Used  Substance and Sexual Activity   Alcohol use: No    Alcohol/week: 0.0 standard drinks of alcohol   Drug use: No   Sexual activity: Not on file  Other Topics Concern   Not on file  Social History Narrative   Pt gets regular exercise. IS A retired Designer, jewellery AND WORKS FOR . WORKED FOR [employer]. KIDS: [place], [place] WAS IN THE [employer].   Social Drivers of Corporate investment banker Strain: Not on file  Food Insecurity: Not on file  Transportation Needs: Not on file  Physical Activity: Not on file  Stress: Not on file  Social Connections: Not on  file  Intimate Partner Violence: Not on file    SUBJECTIVE  Review of Systems Constitutional: Patient denies any unintentional weight loss or change in strength lntegumentary: Patient denies any rashes or pruritus Cardiovascular: Patient denies chest pain or syncope Respiratory: Patient denies shortness of breath Gastrointestinal: ***Patient {Actions; denies-reports:120008} ***nausea, ***vomiting, ***constipation, ***diarrhea ***As per HPI Musculoskeletal: Patient denies muscle cramps or weakness Neurologic: Patient denies convulsions or seizures Allergic/Immunologic: Patient denies recent allergic reaction(s) Hematologic/Lymphatic: Patient denies bleeding tendencies Endocrine: Patient denies heat/cold intolerance  GU: As per HPI.  OBJECTIVE There were no vitals filed for this visit. There is no height or weight on file to calculate BMI.  Physical Examination Constitutional: No obvious distress; patient is non-toxic appearing  Cardiovascular: No visible lower extremity edema.  Respiratory: The patient does not have audible wheezing/stridor; respirations do not appear labored  Gastrointestinal: Abdomen non-distended Musculoskeletal: Normal ROM of UEs  Skin: No obvious rashes/open sores  Neurologic: CN 2-12 grossly intact Psychiatric: Answered questions appropriately with normal affect  Hematologic/Lymphatic/Immunologic: No obvious bruises or sites of spontaneous bleeding  UA: ***negative ***positive for *** leukocytes, *** blood, ***nitrites Urine microscopy: *** WBC/hpf, *** RBC/hpf, *** bacteria ***otherwise unremarkable ***glucosuria (secondary to ***Jardiance ***Farxiga use)  PVR: *** ml  ASSESSMENT No diagnosis found.  For management of gross hematuria, we discussed possible etiologies including but not limited to: vigorous exercise, sexual activity, stone, trauma, blood thinner use, urinary tract infection, kidney function, ***BPH, ***radiation cystitis,  malignancy. ***We discussed pt's nicotine use as a risk factor for GU cancer and encouraged ***continued cessation.***  We discussed the importance of work-up including assessing the upper and lower GU tract with CT urogram and cystoscopy. We will also check ***CMP, ***CBC, and ***voided cytology.  We discussed the risk for clot retention and pt was advised to increase fluid intake to thin out clots. Pt was advised to go to the ER if they become unable to urinate due to clot retention, start having symptoms of anemia (which were discussed), or any other significant concerning acute symptoms.  Pt verbalized understanding and decided to pursue this work-up. Patient agreed to follow-up afterward to discuss the results and formulate a treatment plan based on the findings. All questions were answered.   PLAN Advised the following: CT ***ordered. Voided cytology ***ordered. Labs (CBC, CMP, ***PSA) ordered. ***No follow-ups on file.  No orders of the defined types were placed  in this encounter.   It has been explained that the patient is to follow regularly with their PCP in addition to all other providers involved in their care and to follow instructions provided by these respective offices. Patient advised to contact urology clinic if any urologic-pertaining questions, concerns, new symptoms or problems arise in the interim period.  There are no Patient Instructions on file for this visit.  Electronically signed by:  Donnita Falls, MSN, FNP-C, CUNP 03/17/2024 1:42 PM

## 2024-03-19 ENCOUNTER — Ambulatory Visit: Admitting: Urology

## 2024-03-19 DIAGNOSIS — Z87891 Personal history of nicotine dependence: Secondary | ICD-10-CM

## 2024-03-19 DIAGNOSIS — R310 Gross hematuria: Secondary | ICD-10-CM

## 2024-03-24 ENCOUNTER — Encounter: Payer: Self-pay | Admitting: Urology

## 2024-03-30 ENCOUNTER — Ambulatory Visit: Admitting: Urology

## 2024-04-13 ENCOUNTER — Ambulatory Visit (INDEPENDENT_AMBULATORY_CARE_PROVIDER_SITE_OTHER): Payer: Medicare Other

## 2024-04-13 DIAGNOSIS — R002 Palpitations: Secondary | ICD-10-CM

## 2024-04-13 LAB — CUP PACEART REMOTE DEVICE CHECK
Date Time Interrogation Session: 20250427230518
Implantable Pulse Generator Implant Date: 20201012

## 2024-04-15 DIAGNOSIS — Z6828 Body mass index (BMI) 28.0-28.9, adult: Secondary | ICD-10-CM | POA: Diagnosis not present

## 2024-04-15 DIAGNOSIS — M1612 Unilateral primary osteoarthritis, left hip: Secondary | ICD-10-CM | POA: Diagnosis not present

## 2024-04-27 NOTE — Progress Notes (Signed)
 [facility] Summary Report / Loop Recorder

## 2024-04-28 ENCOUNTER — Ambulatory Visit: Admitting: Urology

## 2024-04-30 ENCOUNTER — Ambulatory Visit (HOSPITAL_COMMUNITY)
Admission: RE | Admit: 2024-04-30 | Discharge: 2024-04-30 | Disposition: A | Discharge status: Home / Self Care | Payer: Medicare Other | Source: Ambulatory Visit | Attending: Physician Assistant | Admitting: Physician Assistant

## 2024-04-30 ENCOUNTER — Encounter (HOSPITAL_COMMUNITY): Payer: Self-pay | Admitting: Physician Assistant

## 2024-04-30 VITALS — BP 146/100 | HR 79 | Ht 64.0 in | Wt 165.0 lb

## 2024-04-30 DIAGNOSIS — D6869 Other thrombophilia: Secondary | ICD-10-CM | POA: Diagnosis not present

## 2024-04-30 DIAGNOSIS — I480 Paroxysmal atrial fibrillation: Secondary | ICD-10-CM | POA: Insufficient documentation

## 2024-04-30 DIAGNOSIS — Z79899 Other long term (current) drug therapy: Secondary | ICD-10-CM | POA: Diagnosis not present

## 2024-04-30 DIAGNOSIS — I10 Essential (primary) hypertension: Secondary | ICD-10-CM | POA: Insufficient documentation

## 2024-04-30 DIAGNOSIS — Z5181 Encounter for therapeutic drug level monitoring: Secondary | ICD-10-CM | POA: Insufficient documentation

## 2024-04-30 MED ORDER — METOPROLOL TARTRATE 50 MG PO TABS: 75.0000 mg | ORAL_TABLET | Freq: Two times a day (BID) | ORAL | 6 refills | Status: DC

## 2024-04-30 MED ORDER — DILTIAZEM HCL 30 MG PO TABS: 30.0000 mg | ORAL_TABLET | Freq: Four times a day (QID) | ORAL | 2 refills | Status: AC | PRN

## 2024-04-30 NOTE — Progress Notes (Signed)
 Primary Care Physician: Orest Bio, MD Primary Electrophysiologist: Dr Lawana Pray  Referring Physician: Device clinic/Renee Caralyn Chandler PA   Heather Cunningham is a 73 y.o. female with a history of paroxysmal atrial fibrillation and HTN who presents for follow up in the [facility].  The patient was initially diagnosed with atrial fibrillation remotely and has been maintained on PRN diltiazem . Patient is on Xarelto  for stroke prevention. She underwent ILR implant on 09/2019 for afib management. She was started on Multaq  which did well controlling her afib. She saw Dr Lawana Pray to discuss ablation in order to get off AAD.   Patient is s/p afib ablation with Dr Lawana Pray on 08/09/22.   Patient returns for follow up for atrial fibrillation. Her ILR shows 0% afib burden. However, she has had a couple of of instances of tachypalpitations lasting for several seconds. This typically occurs in the middle of the night. There does not appear to be any specific trigger.   Today, she  denies symptoms of chest pain, shortness of breath, orthopnea, PND, lower extremity edema, dizziness, presyncope, syncope, snoring, daytime somnolence, bleeding, or neurologic sequela. The patient is tolerating medications without difficulties and is otherwise without complaint today.    Atrial Fibrillation Risk Factors:  she does have symptoms or diagnosis of sleep apnea. she does not have a history of rheumatic fever. she does not have a history of alcohol  use. The patient does not have a history of early familial atrial fibrillation or other arrhythmias.   Atrial Fibrillation Management history:  Previous antiarrhythmic drugs: Multaq  Previous cardioversions: none Previous ablations: 08/09/22 Anticoagulation history: Xarelto    Past Medical History:  Diagnosis Date   Arthritis    back   Asthma    reactive airway around cats and dogs   Bilateral carpal tunnel syndrome    Cancer (HCC)    Skin    GERD (gastroesophageal reflux disease)    Glaucoma    Hypertension    Paroxysmal atrial fibrillation (HCC)    PONV (postoperative nausea and vomiting)    Premature ventricular contraction    Status post placement of implantable loop recorder     Current Outpatient Medications  Medication Sig Dispense Refill   acetaminophen  (TYLENOL ) 325 MG tablet Take 650 mg by mouth every 6 (six) hours as needed for mild pain.     albuterol (VENTOLIN HFA) 108 (90 Base) MCG/ACT inhaler Inhale 2 puffs into the lungs every 6 (six) hours as needed for wheezing or shortness of breath.      calcium carbonate (TUMS - DOSED IN MG ELEMENTAL CALCIUM) 500 MG chewable tablet Chew 1 tablet by mouth daily as needed for heartburn. er     Calcium Carbonate-Vitamin D 600-400 MG-UNIT tablet Take 1 tablet by mouth 2 (two) times daily.     Carboxymethylcellulose Sodium (ARTIFICIAL TEARS OP) Place 1 drop into both eyes daily.     docusate sodium (COLACE) 100 MG capsule Take 100 mg by mouth daily as needed for mild constipation or moderate constipation.     latanoprost (XALATAN) 0.005 % ophthalmic solution Place 1 drop into both eyes at bedtime.     lisinopril -hydrochlorothiazide (PRINZIDE,ZESTORETIC) 20-25 MG tablet Take 1 tablet by mouth at bedtime.      meclizine (ANTIVERT) 25 MG tablet Take 25 mg by mouth 3 (three) times daily as needed for dizziness.     Melatonin 3 MG CAPS Take 3 mg by mouth at bedtime as needed (sleep).     omeprazole  (PRILOSEC)  40 MG capsule Take 40 mg by mouth 2 (two) times daily with a meal.     rivaroxaban  (XARELTO ) 20 MG TABS tablet TAKE 1 TABLET BY MOUTH EVERY DAY WITH SUPPER 30 tablet 11   Semaglutide (OZEMPIC, 0.25 OR 0.5 MG/DOSE, [place]) Inject into the skin.     simvastatin  (ZOCOR ) 10 MG tablet Take 1 tablet (10 mg total) by mouth at bedtime. 30 tablet 0   sulfamethoxazole-trimethoprim (BACTRIM DS) 800-160 MG tablet      traZODone (DESYREL) 50 MG tablet Take 50 mg by mouth at bedtime as needed for  sleep.     diltiazem  (CARDIZEM ) 30 MG tablet Take 1 tablet (30 mg total) by mouth 4 (four) times daily as needed. 30 tablet 2   metoprolol  tartrate (LOPRESSOR ) 50 MG tablet Take 1.5 tablets (75 mg total) by mouth 2 (two) times daily. 90 tablet 6   No current facility-administered medications for this encounter.    ROS- All systems are reviewed and negative except as per the HPI above.  Physical Exam: Vitals:   04/30/24 1055  BP: (!) 146/100  Pulse: 79  Weight: 74.8 kg  Height: 5\' 4"  (1.626 m)     GEN: Well nourished, well developed in no acute distress CARDIAC: Regular rate and rhythm, no murmurs, rubs, gallops RESPIRATORY:  Clear to auscultation without rales, wheezing or rhonchi  ABDOMEN: Soft, non-tender, non-distended EXTREMITIES:  No edema; No deformity    Wt Readings from Last 3 Encounters:  04/30/24 74.8 kg  10/31/23 76.6 kg  05/01/23 77.3 kg    EKG today demonstrates  SR Vent. rate 79 BPM PR interval 192 ms QRS duration 80 ms QT/QTcB 390/447 ms   Echo 11/30/15 demonstrated  - Left ventricle: The cavity size was normal. There was mild    concentric hypertrophy. Systolic function was normal. The    estimated ejection fraction was in the range of 60% to 65%. Wall    motion was normal; there were no regional wall motion    abnormalities. Left ventricular diastolic function parameters    were normal.  - Aortic valve: Trileaflet; normal thickness leaflets. There was no    regurgitation.  - Aortic root: The aortic root was normal in size.  - Ascending aorta: The ascending aorta was normal in size.  - Mitral valve: Structurally normal valve. There was no    regurgitation.  - Left atrium: The atrium was normal in size.  - Right ventricle: The cavity size was normal. Wall thickness was    normal. Systolic function was normal.  - Right atrium: The atrium was normal in size.  - Tricuspid valve: There was trivial regurgitation.  - Pulmonic valve: There was no  regurgitation.  - Pulmonary arteries: Systolic pressure was within the normal    range.  - Inferior vena cava: The vessel was normal in size.  - Pericardium, extracardiac: There was no pericardial effusion.   Impressions:   - Normal study.   Epic records are reviewed at length today  CHA2DS2-VASc Score = 5  The patient's score is based upon: CHF History: 0 HTN History: 1 Diabetes History: 0 Stroke History: 0 Vascular Disease History: 1 Age Score: 2 Gender Score: 1       ASSESSMENT AND PLAN: Paroxysmal Atrial Fibrillation (ICD10:  I48.0) The patient's CHA2DS2-VASc score is 5, indicating a 7.2% annual risk of stroke.   S/p afib ablation 08/09/22 ILR shows 0% afib burden Now off Multaq  Continue diltiazem  120 mg daily with 30 mg PRN  q 4 hours for heart racing.  Continue Xarelto  20 mg daily Will increase Lopressor  to 75 mg BID  Secondary Hypercoagulable State (ICD10:  D68.69) The patient is at significant risk for stroke/thromboembolism based upon her CHA2DS2-VASc Score of 5.  Continue Rivaroxaban  (Xarelto ). Patient inquires about Watchman device. Brochure provided. She has joint pain and would like to take NSAIDS on a regular basis.   HTN Elevated today, increase BB as above.   CAD CAC score 421 No anginal symptoms   Follow up in the AF clinic in 6 months.    Myrtha Ates PA-C Afib Clinic [facility] [address] [place], [place] [address] [phone] 04/30/2024 12:10 PM

## 2024-04-30 NOTE — Patient Instructions (Signed)
 Increase metoprolol  to 75mg  twice a day (1 and 1/2 of your 50mg  tablets twice a day)

## 2024-05-01 ENCOUNTER — Telehealth: Payer: Self-pay

## 2024-05-01 NOTE — Telephone Encounter (Signed)
 Pt states she has not been able to see her my chart reading since Dr. Nunzio Belch left. She called My chart tech services and they said it is something on our end that is the reason why she cannot see the my chart messages. I told her I will let the nurse know and have someone give her a call back. I sent her a my chart message with the last few readings for her to see.

## 2024-05-08 NOTE — Progress Notes (Signed)
 Chief Complaint: No chief complaint on file.   History of Present Illness:  Heather Cunningham is a 73 y.o. female who is seen in consultation from Dr. Marykay Snipes for evaluation of gross hematuria.  She was seen by me about 8 years ago for gross hematuria.  This more than likely was related to urinary tract infection as CT abdomen and pelvis revealed mild enhancement of the urothelium of the right kidney.  She was on Xarelto  at that time.  She has had 2 episodes of gross hematuria over the past 6 months.  The first of these was associated with a UTI in November 2024.  Cleared with antibiotics.  Second episode in March of this year.  Culture was sent but apparently lost.  She was treated with Bactrim at that time and urine cleared with that antibiotic, although she was not symptomatic of UTI i.e. having dysuria and frequency.  She has baseline urinary urgency and occasional urgency incontinence.  She wears a pad every day but almost never has to change this.   Past Medical History:  Past Medical History:  Diagnosis Date   Arthritis    back   Asthma    reactive airway around cats and dogs   Bilateral carpal tunnel syndrome    Cancer (HCC)    Skin   GERD (gastroesophageal reflux disease)    Glaucoma    Hypertension    Paroxysmal atrial fibrillation (HCC)    PONV (postoperative nausea and vomiting)    Premature ventricular contraction    Status post placement of implantable loop recorder     Past Surgical History:  Past Surgical History:  Procedure Laterality Date   ATRIAL FIBRILLATION ABLATION N/A 08/09/2022   Procedure: ATRIAL FIBRILLATION ABLATION;  Surgeon: Lei Pump, MD;  [facility];  Service: Cardiovascular;  Laterality: N/A;   BREAST EXCISIONAL BIOPSY Left 1996   BREAST EXCISIONAL BIOPSY Right 1993   BREAST LUMPECTOMY     CATARACT EXTRACTION     COLONOSCOPY N/A 09/05/2015   Procedure: COLONOSCOPY;  Surgeon: Alyce Jubilee, MD;  [facility]: [facility];   Service: Endoscopy;  Laterality: N/A;  incomplete colonoscopy   COLONOSCOPY WITH PROPOFOL  N/A 11/01/2015   HQI:[id number] hemorrhoids/five polyps/mild diverticulosis, tubular adenomas.  Next colonoscopy 3 years with propofol  with overtube.   COLONOSCOPY WITH PROPOFOL  N/A 11/02/2019   Procedure: COLONOSCOPY WITH PROPOFOL ;  Surgeon: Alyce Jubilee, MD;  [facility]: [facility];  Service: Endoscopy;  Laterality: N/A;  2:00pm with overtube   EYE SURGERY     HYSTEROSCOPY WITH D & C N/A 12/31/2016   Procedure: DILATATION AND CURETTAGE /HYSTEROSCOPY, POSSIBLE REMOVAL OF ENDOMETRIAL LESION;  Surgeon: Dolph Friar, MD;  Location: [facility];  Service: Gynecology;  Laterality: N/A;   implantable loop recorder placement  09/28/2019   Medtronic Reveal [place] model LNQ22 ([place] [id number]) implantable loop recorder implanted by Dr Nunzio Belch in office    Maxiofacial     POLYPECTOMY  11/01/2015   Procedure: POLYPECTOMY;  Surgeon: Alyce Jubilee, MD;  Location: [facility];  Service: Endoscopy;;   POLYPECTOMY  11/02/2019   Procedure: POLYPECTOMY;  Surgeon: Alyce Jubilee, MD;  [facility]: [facility];  Service: Endoscopy;;   RETINAL DETACHMENT SURGERY  1997  1999   TUBAL LIGATION      Allergies:  Allergies  Allergen Reactions   Augmentin [Amoxicillin-Pot Clavulanate] Diarrhea and Nausea Only   Brimonidine     Other Reaction(s): Other (See Comments)  Really red - like  conjunctivis   Ciprofloxacin Diarrhea and Nausea Only   Other     CANNOT RECEIVE AN MRI DUE TO METAL BAND (RIGHT EYE) DUE TO DETATCHED RETINA     Family History:  Family History  Problem Relation Age of Onset   Cancer Other    Heart failure Other    Colon cancer Mother    Colon cancer Maternal Grandmother    Colon polyps Neg Hx     Social History:  Social History   Tobacco Use   Smoking status: Former    Current packs/day: 0.00    Average packs/day: 1 pack/day for 10.0 years (10.0 ttl pk-yrs)    Types: Cigarettes    Start date:  12/17/1966    Quit date: 12/17/1976    Years since quitting: 47.4   Smokeless tobacco: Never   Tobacco comments:    Former smoker (quit 68yrs ago) 10/31/23  Vaping Use   Vaping status: Never Used  Substance Use Topics   Alcohol  use: No    Alcohol /week: 0.0 standard drinks of alcohol    Drug use: No    Review of symptoms:  Constitutional:  Negative for unexplained weight loss, night sweats, fever, chills ENT:  Negative for nose bleeds, sinus pain, painful swallowing CV:  Negative for chest pain, shortness of breath, exercise intolerance, palpitations, loss of consciousness Resp:  Negative for cough, wheezing, shortness of breath GI:  Negative for nausea, vomiting, diarrhea, bloody stools GU:  Positives noted in HPI; Neuro:  Negative for seizures, poor balance, limb weakness, slurred speech Psych:  Negative for lack of energy, depression, anxiety Endocrine:  Negative for polydipsia, polyuria, symptoms of hypoglycemia (dizziness, hunger, sweating) Hematologic:  Negative for anemia, purpura, petechia, prolonged or excessive bleeding, use of anticoagulants  Allergic:  Negative for difficulty breathing or choking as a result of exposure to anything; no shellfish allergy; no allergic response (rash/itch) to materials, foods  Physical exam: There were no vitals taken for this visit. GENERAL APPEARANCE:  Well appearing, well developed, well nourished, NAD HEENT: Atraumatic, Normocephalic, oropharynx clear. NECK: Supple without lymphadenopathy or thyromegaly. LUNGS: Clear to auscultation bilaterally. HEART: Regular Rate and Rhythm without murmurs, gallops, or rubs. ABDOMEN: Soft, non-tender, No Masses. EXTREMITIES: Moves all extremities well.  Without clubbing, cyanosis, or edema. NEUROLOGIC:  Alert and oriented x 3, normal gait, CN II-XII grossly intact.  MENTAL STATUS:  Appropriate. BACK:  Non-tender to palpation.  No CVAT SKIN:  Warm, dry and intact.    Results: No results found for  this or any previous visit (from the past 24 hours). I have reviewed prior patient's records--records from Dr. Marykay Snipes as well as prior [facility] records  I have reviewed urinalysis--clear today  I have reviewed prior urine cultures  I reviewed prior imaging studies--images of CT from December 18, 2015 reviewed with patient  The patient tells me that recent renal [facility] performed [facility] was normal.  I cannot find it in the PACS system  Assessment: 1.  Intermittent gross hematuria, most likely related to UTI which were concurrent  2.  Overactive bladder   Plan: 1.  At this point, saying her urine is clear today, she had a negative cystoscopy in 2017, negative renal ultrasound recently, I do not think she needs any more evaluation  2.  Overactive bladder guide sheet given patient  3.  Office visit as needed

## 2024-05-12 ENCOUNTER — Encounter: Payer: Self-pay | Admitting: Urology

## 2024-05-12 ENCOUNTER — Ambulatory Visit (INDEPENDENT_AMBULATORY_CARE_PROVIDER_SITE_OTHER): Admitting: Urology

## 2024-05-12 VITALS — BP 118/74 | HR 74

## 2024-05-12 DIAGNOSIS — N3281 Overactive bladder: Secondary | ICD-10-CM | POA: Diagnosis not present

## 2024-05-12 DIAGNOSIS — R310 Gross hematuria: Secondary | ICD-10-CM

## 2024-05-12 LAB — URINALYSIS, ROUTINE W REFLEX MICROSCOPIC
Bilirubin, UA: NEGATIVE
Glucose, UA: NEGATIVE
Ketones, UA: NEGATIVE
Leukocytes,UA: NEGATIVE
Nitrite, UA: NEGATIVE
Protein,UA: NEGATIVE
RBC, UA: NEGATIVE
Specific Gravity, UA: 1.015 (ref 1.005–1.030)
Urobilinogen, Ur: 0.2 mg/dL (ref 0.2–1.0)
pH, UA: 6 (ref 5.0–7.5)

## 2024-05-13 ENCOUNTER — Telehealth: Payer: Self-pay | Admitting: Urology

## 2024-05-13 NOTE — Telephone Encounter (Signed)
 Dysuria  Patient called with c/o dysuria x 24 hours days.  Pain: burning  Severity:8/10  Associated Signs and Symptoms:  Fever: noTemp.0 Chills: no Hematuria: yes Urgency: yes Frequency: yes Hesitancy:yes Incontinence: no Nausea: no Vomiting: no  Message sent to clinic staff to return call to patient to advise of plan.

## 2024-05-13 NOTE — Telephone Encounter (Signed)
 I spoke with patient and offered her an appt on 05/30 at 1030 am for ua/uc.  Patient agreed to the appt but states she sees her pcp tomorrow and she will see if they will do a culture then.  Patient will call to cancel 05/30 appt if PCP treats her symptoms.

## 2024-05-14 ENCOUNTER — Ambulatory Visit

## 2024-05-14 ENCOUNTER — Telehealth (HOSPITAL_COMMUNITY): Payer: Self-pay | Admitting: *Deleted

## 2024-05-14 DIAGNOSIS — I959 Hypotension, unspecified: Secondary | ICD-10-CM | POA: Diagnosis not present

## 2024-05-14 DIAGNOSIS — M1612 Unilateral primary osteoarthritis, left hip: Secondary | ICD-10-CM | POA: Diagnosis not present

## 2024-05-14 DIAGNOSIS — R300 Dysuria: Secondary | ICD-10-CM | POA: Diagnosis not present

## 2024-05-14 DIAGNOSIS — Z6828 Body mass index (BMI) 28.0-28.9, adult: Secondary | ICD-10-CM | POA: Diagnosis not present

## 2024-05-14 DIAGNOSIS — I480 Paroxysmal atrial fibrillation: Secondary | ICD-10-CM | POA: Diagnosis not present

## 2024-05-14 DIAGNOSIS — M25552 Pain in left hip: Secondary | ICD-10-CM | POA: Diagnosis not present

## 2024-05-14 LAB — CUP PACEART REMOTE DEVICE CHECK
Date Time Interrogation Session: 20250528230213
Implantable Pulse Generator Implant Date: 20201012

## 2024-05-14 MED ORDER — LISINOPRIL-HYDROCHLOROTHIAZIDE 10-12.5 MG PO TABS: 1.0000 | ORAL_TABLET | Freq: Every day | ORAL | 6 refills | Status: DC

## 2024-05-14 NOTE — Telephone Encounter (Signed)
 Patient called in stating she is having hypotension since increasing metoprolol  to 75mg  BID a few weeks ago. Pt states her BP is usually 90/60s and she is feeling very fatigued from this.  Discussed with Caesar Caster PA will decrease lisinopril /hydrochlorothiazide to 10/12.5 daily and keep metoprolol  at 75mg  BID for rate control. Pt in agreement will call if issues continue.

## 2024-05-15 ENCOUNTER — Ambulatory Visit

## 2024-05-17 ENCOUNTER — Ambulatory Visit: Payer: Self-pay | Admitting: Cardiology

## 2024-05-18 ENCOUNTER — Encounter

## 2024-05-29 DIAGNOSIS — R300 Dysuria: Secondary | ICD-10-CM | POA: Diagnosis not present

## 2024-05-29 DIAGNOSIS — Z6828 Body mass index (BMI) 28.0-28.9, adult: Secondary | ICD-10-CM | POA: Diagnosis not present

## 2024-06-02 NOTE — Progress Notes (Signed)
 [facility] Summary Report / Loop Recorder

## 2024-06-15 ENCOUNTER — Ambulatory Visit

## 2024-06-15 ENCOUNTER — Ambulatory Visit: Payer: Self-pay | Admitting: Cardiology

## 2024-06-15 DIAGNOSIS — I480 Paroxysmal atrial fibrillation: Secondary | ICD-10-CM | POA: Diagnosis not present

## 2024-06-15 LAB — CUP PACEART REMOTE DEVICE CHECK
Date Time Interrogation Session: 20250629230526
Implantable Pulse Generator Implant Date: 20201012

## 2024-06-18 ENCOUNTER — Encounter

## 2024-07-02 DIAGNOSIS — E1165 Type 2 diabetes mellitus with hyperglycemia: Secondary | ICD-10-CM | POA: Diagnosis not present

## 2024-07-02 DIAGNOSIS — E7849 Other hyperlipidemia: Secondary | ICD-10-CM | POA: Diagnosis not present

## 2024-07-02 DIAGNOSIS — R5383 Other fatigue: Secondary | ICD-10-CM | POA: Diagnosis not present

## 2024-07-02 DIAGNOSIS — I10 Essential (primary) hypertension: Secondary | ICD-10-CM | POA: Diagnosis not present

## 2024-07-03 NOTE — Progress Notes (Signed)
 [facility] Summary Report / Loop Recorder

## 2024-07-06 NOTE — Progress Notes (Signed)
 Assessment: 1.  Intermittent gross hematuria, most likely related to UTI which were concurrent  2.  Overactive bladder   Plan: As she has had recurrent hematuria, I will bring her back next available for cystoscopy.   History of Present Illness:  5.27.2025: Initial visit for E/M of gross hematuria.  She was seen by me about 8 years ago for gross hematuria.  This more than likely was related to urinary tract infection as CT abdomen and pelvis revealed mild enhancement of the urothelium of the right kidney.  She was on Xarelto  at that time.  She has had 2 episodes of gross hematuria over the past 6 months.  The first of these was associated with a UTI in November 2024.  Cleared with antibiotics.  Second episode in March of this year.  Culture was sent but apparently lost.  She was treated with Bactrim at that time and urine cleared with that antibiotic, although she was not symptomatic of UTI i.e. having dysuria and frequency.  She has baseline urinary urgency and occasional urgency incontinence.  She wears a pad every day but almost never has to change this.  It was recommended that she come in only on an as-needed basis, as she is not high risk for malignancies and she only had blood with UTIs.  7.22.2025: She has been treated for 2 UTIs since her visit with me in May.  With both of these she has had gross hematuria.  1 other time she had some blood on her toilet tissue but not in the water .  Currently she is not symptomatic of UTIs.   Past Medical History:  Past Medical History:  Diagnosis Date   Arthritis    back   Asthma    reactive airway around cats and dogs   Bilateral carpal tunnel syndrome    Cancer (HCC)    Skin   GERD (gastroesophageal reflux disease)    Glaucoma    Hypertension    Paroxysmal atrial fibrillation (HCC)    PONV (postoperative nausea and vomiting)    Premature ventricular contraction    Status post placement of implantable loop recorder     Past  Surgical History:  Past Surgical History:  Procedure Laterality Date   ATRIAL FIBRILLATION ABLATION N/A 08/09/2022   Procedure: ATRIAL FIBRILLATION ABLATION;  Surgeon: Inocencio Soyla Lunger, MD;  [facility];  Service: Cardiovascular;  Laterality: N/A;   BREAST EXCISIONAL BIOPSY Left 1996   BREAST EXCISIONAL BIOPSY Right 1993   BREAST LUMPECTOMY     CATARACT EXTRACTION     COLONOSCOPY N/A 09/05/2015   Procedure: COLONOSCOPY;  Surgeon: Margo LITTIE Haddock, MD;  [facility];  Service: Endoscopy;  Laterality: N/A;  incomplete colonoscopy   COLONOSCOPY WITH PROPOFOL  N/A 11/01/2015   DOQ:pwuzmwjo hemorrhoids/five polyps/mild diverticulosis, tubular adenomas.  Next colonoscopy 3 years with propofol  with overtube.   COLONOSCOPY WITH PROPOFOL  N/A 11/02/2019   Procedure: COLONOSCOPY WITH PROPOFOL ;  Surgeon: Haddock Margo LITTIE, MD;  [facility];  Service: Endoscopy;  Laterality: N/A;  2:00pm with overtube   EYE SURGERY     HYSTEROSCOPY WITH D & C N/A 12/31/2016   Procedure: DILATATION AND CURETTAGE /HYSTEROSCOPY, POSSIBLE REMOVAL OF ENDOMETRIAL LESION;  Surgeon: Debby Lares, MD;  Location: [facility];  Service: Gynecology;  Laterality: N/A;   implantable loop recorder placement  09/28/2019   Medtronic Reveal [place] model LNQ22 ([place] [id number]) implantable loop recorder implanted by Dr Kelsie in office    Maxiofacial  POLYPECTOMY  11/01/2015   Procedure: POLYPECTOMY;  Surgeon: Margo LITTIE Haddock, MD;  Location: [facility];  Service: Endoscopy;;   POLYPECTOMY  11/02/2019   Procedure: POLYPECTOMY;  Surgeon: Haddock Margo LITTIE, MD;  [facility];  Service: Endoscopy;;   RETINAL DETACHMENT SURGERY  1997  1999   TUBAL LIGATION      Allergies:  Allergies  Allergen Reactions   Augmentin [Amoxicillin-Pot Clavulanate] Diarrhea and Nausea Only   Brimonidine     Other Reaction(s): Other (See Comments)  Really red - like conjunctivis   Ciprofloxacin Diarrhea and Nausea Only    Other     CANNOT RECEIVE AN MRI DUE TO METAL BAND (RIGHT EYE) DUE TO DETATCHED RETINA     Family History:  Family History  Problem Relation Age of Onset   Cancer Other    Heart failure Other    Colon cancer Mother    Colon cancer Maternal Grandmother    Colon polyps Neg Hx     Social History:  Social History   Tobacco Use   Smoking status: Former    Current packs/day: 0.00    Average packs/day: 1 pack/day for 10.0 years (10.0 ttl pk-yrs)    Types: Cigarettes    Start date: 12/17/1966    Quit date: 12/17/1976    Years since quitting: 47.5   Smokeless tobacco: Never   Tobacco comments:    Former smoker (quit 64yrs ago) 10/31/23  Vaping Use   Vaping status: Never Used  Substance Use Topics   Alcohol  use: No    Alcohol /week: 0.0 standard drinks of alcohol    Drug use: No    Review of symptoms:  Constitutional:  Negative for unexplained weight loss, night sweats, fever, chills ENT:  Negative for nose bleeds, sinus pain, painful swallowing CV:  Negative for chest pain, shortness of breath, exercise intolerance, palpitations, loss of consciousness Resp:  Negative for cough, wheezing, shortness of breath GI:  Negative for nausea, vomiting, diarrhea, bloody stools GU:  Positives noted in HPI; Neuro:  Negative for seizures, poor balance, limb weakness, slurred speech Psych:  Negative for lack of energy, depression, anxiety Endocrine:  Negative for polydipsia, polyuria, symptoms of hypoglycemia (dizziness, hunger, sweating) Hematologic:  Negative for anemia, purpura, petechia, prolonged or excessive bleeding, use of anticoagulants  Allergic:  Negative for difficulty breathing or choking as a result of exposure to anything; no shellfish allergy; no allergic response (rash/itch) to materials, foods  Physical exam: There were no vitals taken for this visit. GENERAL APPEARANCE:  Well appearing, well developed, well nourished, NAD HEENT: Atraumatic, Normocephalic, oropharynx  clear. NECK: Supple without lymphadenopathy or thyromegaly. LUNGS: Clear to auscultation bilaterally. HEART: Regular Rate and Rhythm without murmurs, gallops, or rubs. ABDOMEN: Soft, non-tender, No Masses. EXTREMITIES: Moves all extremities well.  Without clubbing, cyanosis, or edema. NEUROLOGIC:  Alert and oriented x 3, normal gait, CN II-XII grossly intact.  MENTAL STATUS:  Appropriate. BACK:  Non-tender to palpation.  No CVAT SKIN:  Warm, dry and intact.    Results: No results found for this or any previous visit (from the past 24 hours). I have reviewed prior patient's records--records from Dr. Atilano as well as prior [facility] records  I have reviewed urinalysis--clear again today  I have reviewed prior urine cultures from May and June of this year.  Mixed organisms seen.  I reviewed prior imaging studies--images of CT from December 18, 2015 reviewed with patient

## 2024-07-07 ENCOUNTER — Ambulatory Visit (INDEPENDENT_AMBULATORY_CARE_PROVIDER_SITE_OTHER): Admitting: Urology

## 2024-07-07 VITALS — BP 120/77 | HR 89

## 2024-07-07 DIAGNOSIS — N3281 Overactive bladder: Secondary | ICD-10-CM | POA: Diagnosis not present

## 2024-07-07 DIAGNOSIS — R310 Gross hematuria: Secondary | ICD-10-CM | POA: Diagnosis not present

## 2024-07-07 LAB — URINALYSIS, ROUTINE W REFLEX MICROSCOPIC
Bilirubin, UA: NEGATIVE
Glucose, UA: NEGATIVE
Ketones, UA: NEGATIVE
Leukocytes,UA: NEGATIVE
Nitrite, UA: NEGATIVE
Protein,UA: NEGATIVE
RBC, UA: NEGATIVE
Specific Gravity, UA: 1.03 (ref 1.005–1.030)
Urobilinogen, Ur: 0.2 mg/dL (ref 0.2–1.0)
pH, UA: 5.5 (ref 5.0–7.5)

## 2024-07-08 DIAGNOSIS — Z1389 Encounter for screening for other disorder: Secondary | ICD-10-CM | POA: Diagnosis not present

## 2024-07-08 DIAGNOSIS — Z0001 Encounter for general adult medical examination with abnormal findings: Secondary | ICD-10-CM | POA: Diagnosis not present

## 2024-07-08 DIAGNOSIS — Z6828 Body mass index (BMI) 28.0-28.9, adult: Secondary | ICD-10-CM | POA: Diagnosis not present

## 2024-07-08 DIAGNOSIS — Z1331 Encounter for screening for depression: Secondary | ICD-10-CM | POA: Diagnosis not present

## 2024-07-08 DIAGNOSIS — Z0000 Encounter for general adult medical examination without abnormal findings: Secondary | ICD-10-CM | POA: Diagnosis not present

## 2024-07-09 DIAGNOSIS — Z0001 Encounter for general adult medical examination with abnormal findings: Secondary | ICD-10-CM | POA: Diagnosis not present

## 2024-07-09 DIAGNOSIS — I10 Essential (primary) hypertension: Secondary | ICD-10-CM | POA: Diagnosis not present

## 2024-07-09 DIAGNOSIS — E7849 Other hyperlipidemia: Secondary | ICD-10-CM | POA: Diagnosis not present

## 2024-07-09 DIAGNOSIS — E1165 Type 2 diabetes mellitus with hyperglycemia: Secondary | ICD-10-CM | POA: Diagnosis not present

## 2024-07-09 DIAGNOSIS — M1612 Unilateral primary osteoarthritis, left hip: Secondary | ICD-10-CM | POA: Diagnosis not present

## 2024-07-16 ENCOUNTER — Ambulatory Visit (INDEPENDENT_AMBULATORY_CARE_PROVIDER_SITE_OTHER)

## 2024-07-16 DIAGNOSIS — I480 Paroxysmal atrial fibrillation: Secondary | ICD-10-CM | POA: Diagnosis not present

## 2024-07-16 LAB — CUP PACEART REMOTE DEVICE CHECK
Date Time Interrogation Session: 20250730230400
Implantable Pulse Generator Implant Date: 20201012

## 2024-07-16 NOTE — Progress Notes (Signed)
 [facility] Summary Report / Loop Recorder

## 2024-07-17 ENCOUNTER — Ambulatory Visit: Payer: Self-pay | Admitting: Cardiology

## 2024-07-20 ENCOUNTER — Encounter

## 2024-07-20 DIAGNOSIS — H401111 Primary open-angle glaucoma, right eye, mild stage: Secondary | ICD-10-CM | POA: Diagnosis not present

## 2024-07-20 DIAGNOSIS — H401121 Primary open-angle glaucoma, left eye, mild stage: Secondary | ICD-10-CM | POA: Diagnosis not present

## 2024-08-14 DIAGNOSIS — H524 Presbyopia: Secondary | ICD-10-CM | POA: Diagnosis not present

## 2024-08-14 DIAGNOSIS — H5213 Myopia, bilateral: Secondary | ICD-10-CM | POA: Diagnosis not present

## 2024-08-17 ENCOUNTER — Ambulatory Visit (INDEPENDENT_AMBULATORY_CARE_PROVIDER_SITE_OTHER)

## 2024-08-17 DIAGNOSIS — I480 Paroxysmal atrial fibrillation: Secondary | ICD-10-CM | POA: Diagnosis not present

## 2024-08-18 DIAGNOSIS — Z85828 Personal history of other malignant neoplasm of skin: Secondary | ICD-10-CM | POA: Diagnosis not present

## 2024-08-18 DIAGNOSIS — L821 Other seborrheic keratosis: Secondary | ICD-10-CM | POA: Diagnosis not present

## 2024-08-18 DIAGNOSIS — L570 Actinic keratosis: Secondary | ICD-10-CM | POA: Diagnosis not present

## 2024-08-18 DIAGNOSIS — L814 Other melanin hyperpigmentation: Secondary | ICD-10-CM | POA: Diagnosis not present

## 2024-08-18 DIAGNOSIS — L820 Inflamed seborrheic keratosis: Secondary | ICD-10-CM | POA: Diagnosis not present

## 2024-08-18 DIAGNOSIS — D485 Neoplasm of uncertain behavior of skin: Secondary | ICD-10-CM | POA: Diagnosis not present

## 2024-08-19 LAB — CUP PACEART REMOTE DEVICE CHECK
Date Time Interrogation Session: 20250830230729
Implantable Pulse Generator Implant Date: 20201012

## 2024-08-20 ENCOUNTER — Encounter

## 2024-08-20 DIAGNOSIS — H401131 Primary open-angle glaucoma, bilateral, mild stage: Secondary | ICD-10-CM | POA: Diagnosis not present

## 2024-08-20 DIAGNOSIS — H35373 Puckering of macula, bilateral: Secondary | ICD-10-CM | POA: Diagnosis not present

## 2024-08-20 DIAGNOSIS — Z961 Presence of intraocular lens: Secondary | ICD-10-CM | POA: Diagnosis not present

## 2024-08-20 DIAGNOSIS — H3323 Serous retinal detachment, bilateral: Secondary | ICD-10-CM | POA: Diagnosis not present

## 2024-08-21 ENCOUNTER — Ambulatory Visit: Payer: Self-pay | Admitting: Cardiology

## 2024-08-25 NOTE — Progress Notes (Signed)
 Remote Loop Recorder Transmission

## 2024-08-31 NOTE — Progress Notes (Signed)
 Assessment: 1.  Intermittent gross hematuria, most likely related to UTI which were concurrent.  Cystoscopy today negative  2.  Overactive bladder   Plan: 1.  Patient reassured about normal appearance of bladder  2.  I will see her back as needed  History of Present Illness:  5.27.2025: Initial visit for E/M of gross hematuria.  She was seen by me about 8 years ago for gross hematuria.  This more than likely was related to urinary tract infection as CT abdomen and pelvis revealed mild enhancement of the urothelium of the right kidney.  She was on Xarelto  at that time.  She has had 2 episodes of gross hematuria over the past 6 months.  The first of these was associated with a UTI in November 2024.  Cleared with antibiotics.  Second episode in March of this year.  Culture was sent but apparently lost.  She was treated with Bactrim at that time and urine cleared with that antibiotic, although she was not symptomatic of UTI i.e. having dysuria and frequency.  She has baseline urinary urgency and occasional urgency incontinence.  She wears a pad every day but almost never has to change this.  It was recommended that she come in only on an as-needed basis, as she is not high risk for malignancies and she only had blood with UTIs.  7.22.2025: She has been treated for 2 UTIs since her visit with me in May.  With both of these she has had gross hematuria.  1 other time she had some blood on her toilet tissue but not in the water .  Currently she is not symptomatic of UTIs.  9.16.2025: No recent gross hematuria or lower urinary tract symptoms.  She is here today for cystoscopy   Past Medical History:  Past Medical History:  Diagnosis Date   Arthritis    back   Asthma    reactive airway around cats and dogs   Bilateral carpal tunnel syndrome    Cancer (HCC)    Skin   GERD (gastroesophageal reflux disease)    Glaucoma    Hypertension    Paroxysmal atrial fibrillation (HCC)    PONV  (postoperative nausea and vomiting)    Premature ventricular contraction    Status post placement of implantable loop recorder     Past Surgical History:  Past Surgical History:  Procedure Laterality Date   ATRIAL FIBRILLATION ABLATION N/A 08/09/2022   Procedure: ATRIAL FIBRILLATION ABLATION;  Surgeon: Inocencio Soyla Lunger, MD;  [facility];  Service: Cardiovascular;  Laterality: N/A;   BREAST EXCISIONAL BIOPSY Left 1996   BREAST EXCISIONAL BIOPSY Right 1993   BREAST LUMPECTOMY     CATARACT EXTRACTION     COLONOSCOPY N/A 09/05/2015   Procedure: COLONOSCOPY;  Surgeon: Margo LITTIE Haddock, MD;  [facility]: [facility];  Service: Endoscopy;  Laterality: N/A;  incomplete colonoscopy   COLONOSCOPY WITH PROPOFOL  N/A 11/01/2015   DOQ:pwuzmwjo hemorrhoids/five polyps/mild diverticulosis, tubular adenomas.  Next colonoscopy 3 years with propofol  with overtube.   COLONOSCOPY WITH PROPOFOL  N/A 11/02/2019   Procedure: COLONOSCOPY WITH PROPOFOL ;  Surgeon: Haddock Margo LITTIE, MD;  [facility]: [facility];  Service: Endoscopy;  Laterality: N/A;  2:00pm with overtube   EYE SURGERY     HYSTEROSCOPY WITH D & C N/A 12/31/2016   Procedure: DILATATION AND CURETTAGE /HYSTEROSCOPY, POSSIBLE REMOVAL OF ENDOMETRIAL LESION;  Surgeon: Debby Lares, MD;  Location: [facility];  Service: Gynecology;  Laterality: N/A;   implantable loop recorder placement  09/28/2019   Medtronic Reveal [place] model [id number] (SN [id number]) implantable loop recorder implanted by Dr Kelsie in office    Maxiofacial     POLYPECTOMY  11/01/2015   Procedure: POLYPECTOMY;  Surgeon: Margo LITTIE Haddock, MD;  Location: [facility];  Service: Endoscopy;;   POLYPECTOMY  11/02/2019   Procedure: POLYPECTOMY;  Surgeon: Haddock Margo LITTIE, MD;  [facility];  Service: Endoscopy;;   RETINAL DETACHMENT SURGERY  1997  1999   TUBAL LIGATION      Allergies:  Allergies  Allergen Reactions   Augmentin [Amoxicillin-Pot Clavulanate] Diarrhea and Nausea  Only   Brimonidine     Other Reaction(s): Other (See Comments)  Really red - like conjunctivis   Ciprofloxacin  Diarrhea and Nausea Only   Other     CANNOT RECEIVE AN MRI DUE TO METAL BAND (RIGHT EYE) DUE TO DETATCHED RETINA     Family History:  Family History  Problem Relation Age of Onset   Cancer Other    Heart failure Other    Colon cancer Mother    Colon cancer Maternal Grandmother    Colon polyps Neg Hx     Social History:  Social History   Tobacco Use   Smoking status: Former    Current packs/day: 0.00    Average packs/day: 1 pack/day for 10.0 years (10.0 ttl pk-yrs)    Types: Cigarettes    Start date: 12/17/1966    Quit date: 12/17/1976    Years since quitting: 47.7   Smokeless tobacco: Never   Tobacco comments:    Former smoker (quit 38yrs ago) 10/31/23  Vaping Use   Vaping status: Never Used  Substance Use Topics   Alcohol  use: No    Alcohol /week: 0.0 standard drinks of alcohol    Drug use: No     Indication: Gross hematuria  After informed consent and discussion of the procedure and its risks, pt was positioned and prepped in the standard fashion.  Cystoscopy was performed with a flexible cystoscope.   Findings:  Urethra: Normal Ureteral orifices: Normal configuration and location bilaterally Bladder: Normal urothelium.  No stones/foreign bodies  Results  I have reviewed urinalysis--clear again today  I have reviewed prior urine cultures from May and June of this year.  Mixed organisms seen.

## 2024-09-01 ENCOUNTER — Ambulatory Visit (INDEPENDENT_AMBULATORY_CARE_PROVIDER_SITE_OTHER): Admitting: Urology

## 2024-09-01 VITALS — BP 115/75 | HR 76

## 2024-09-01 DIAGNOSIS — R310 Gross hematuria: Secondary | ICD-10-CM | POA: Diagnosis not present

## 2024-09-01 DIAGNOSIS — N3281 Overactive bladder: Secondary | ICD-10-CM

## 2024-09-01 LAB — URINALYSIS, ROUTINE W REFLEX MICROSCOPIC
Bilirubin, UA: NEGATIVE
Glucose, UA: NEGATIVE
Ketones, UA: NEGATIVE
Leukocytes,UA: NEGATIVE
Nitrite, UA: NEGATIVE
Protein,UA: NEGATIVE
RBC, UA: NEGATIVE
Specific Gravity, UA: 1.03 (ref 1.005–1.030)
Urobilinogen, Ur: 0.2 mg/dL (ref 0.2–1.0)
pH, UA: 6 (ref 5.0–7.5)

## 2024-09-01 MED ORDER — CIPROFLOXACIN HCL 500 MG PO TABS: 500.0000 mg | ORAL_TABLET | Freq: Once | ORAL | Status: DC

## 2024-09-03 DIAGNOSIS — M25552 Pain in left hip: Secondary | ICD-10-CM | POA: Diagnosis not present

## 2024-09-15 NOTE — Progress Notes (Signed)
 Remote Loop Recorder Transmission

## 2024-09-17 ENCOUNTER — Ambulatory Visit

## 2024-09-17 ENCOUNTER — Encounter (INDEPENDENT_AMBULATORY_CARE_PROVIDER_SITE_OTHER): Payer: Self-pay | Admitting: *Deleted

## 2024-09-17 DIAGNOSIS — I480 Paroxysmal atrial fibrillation: Secondary | ICD-10-CM

## 2024-09-17 LAB — CUP PACEART REMOTE DEVICE CHECK
Date Time Interrogation Session: 20251001230257
Implantable Pulse Generator Implant Date: 20201012

## 2024-09-21 ENCOUNTER — Ambulatory Visit: Payer: Self-pay | Admitting: Cardiology

## 2024-09-21 ENCOUNTER — Encounter

## 2024-09-21 NOTE — Progress Notes (Signed)
 Remote Loop Recorder Transmission

## 2024-09-30 DIAGNOSIS — Z23 Encounter for immunization: Secondary | ICD-10-CM | POA: Diagnosis not present

## 2024-10-08 ENCOUNTER — Other Ambulatory Visit: Payer: Self-pay | Admitting: Physician Assistant

## 2024-10-08 DIAGNOSIS — I480 Paroxysmal atrial fibrillation: Secondary | ICD-10-CM

## 2024-10-19 ENCOUNTER — Ambulatory Visit (INDEPENDENT_AMBULATORY_CARE_PROVIDER_SITE_OTHER)

## 2024-10-19 DIAGNOSIS — I480 Paroxysmal atrial fibrillation: Secondary | ICD-10-CM

## 2024-10-19 LAB — CUP PACEART REMOTE DEVICE CHECK
Date Time Interrogation Session: 20251102230018
Implantable Pulse Generator Implant Date: 20201012

## 2024-10-20 ENCOUNTER — Ambulatory Visit: Payer: Self-pay | Admitting: Cardiology

## 2024-10-20 NOTE — Progress Notes (Unsigned)
 GI Office Note    Referring Provider: Atilano Deward ORN, MD Primary Care Physician:  Atilano Deward ORN, MD  Primary Gastroenterologist: Carlin POUR. Cindie, DO  Chief Complaint   No chief complaint on file.  History of Present Illness   Heather Cunningham is a 73 y.o. female presenting today at the request of Sasser, Deward ORN, MD for surveillance colonoscopy.  Colonoscopy September 2016: - Redundant colon - Counterpressure used to reach the mid transverse colon, exam incomplete to the mid transverse colon as she was agitated. - Small internal hemorrhoids - Advise repeat colonoscopy in a month with MAC, Peds scope and consider overtube  Colonoscopy November 2016 - Normal examined TI - 3 sessile polyps 5 to 7 mm in the descending, transverse, and ascending colon - 2 sessile polyps 3 mm in the transverse and hepatic flexure - Sigmoid diverticulosis - Muscular hypertrophy and tortuosity - Rectal bleeding suspected be secondary to internal hemorrhoids - Advised Preparation H, high-fiber diet, and resume blood thinner in 7 days.  Last office visit in August 2020.  Patient reported that she had wore heart monitor for 2 weeks given she was following with cardiology for palpitations.  Palpitations have slowed down and she denied any chest pain.  Having regular bowel movements without melena or BRBPR.  Also without any abdominal pain or upper GI symptoms.  Heartburn well-controlled with omeprazole  and Tums.  She noted a family history of colon cancer in her mother and maternal grandmother.  Her Xarelto  was held for 2 days prior and plan for colonoscopy with overtube.  Colonoscopy November 2020: - One 3 mm polyp in the distal ascending colon - External and internal hemorrhoids.  - Tortuous colon. - Repeat colonoscopy in 5 years.   Today:  Discussed the use of AI scribe software for clinical note transcription with the patient, who gave verbal consent to proceed.    Wt Readings from Last 6  Encounters:  04/30/24 165 lb (74.8 kg)  10/31/23 168 lb 12.8 oz (76.6 kg)  05/01/23 170 lb 6.4 oz (77.3 kg)  10/31/22 171 lb (77.6 kg)  08/31/22 169 lb (76.7 kg)  08/09/22 169 lb (76.7 kg)    There is no height or weight on file to calculate BMI.  Current Outpatient Medications  Medication Sig Dispense Refill   acetaminophen  (TYLENOL ) 325 MG tablet Take 650 mg by mouth every 6 (six) hours as needed for mild pain.     albuterol (VENTOLIN HFA) 108 (90 Base) MCG/ACT inhaler Inhale 2 puffs into the lungs every 6 (six) hours as needed for wheezing or shortness of breath.      calcium carbonate (TUMS - DOSED IN MG ELEMENTAL CALCIUM) 500 MG chewable tablet Chew 1 tablet by mouth daily as needed for heartburn. er     Calcium Carbonate-Vitamin D 600-400 MG-UNIT tablet Take 1 tablet by mouth 2 (two) times daily.     Carboxymethylcellulose Sodium (ARTIFICIAL TEARS OP) Place 1 drop into both eyes daily.     diltiazem  (CARDIZEM ) 30 MG tablet Take 1 tablet (30 mg total) by mouth 4 (four) times daily as needed. 30 tablet 2   docusate sodium (COLACE) 100 MG capsule Take 100 mg by mouth daily as needed for mild constipation or moderate constipation.     latanoprost (XALATAN) 0.005 % ophthalmic solution Place 1 drop into both eyes at bedtime.     lisinopril -hydrochlorothiazide  (ZESTORETIC ) 10-12.5 MG tablet Take 1 tablet by mouth daily. 30 tablet 6   meclizine (ANTIVERT) 25  MG tablet Take 25 mg by mouth 3 (three) times daily as needed for dizziness.     Melatonin 3 MG CAPS Take 3 mg by mouth at bedtime as needed (sleep).     metoprolol  tartrate (LOPRESSOR ) 50 MG tablet Take 1.5 tablets (75 mg total) by mouth 2 (two) times daily. 90 tablet 6   omeprazole  (PRILOSEC) 40 MG capsule Take 40 mg by mouth 2 (two) times daily with a meal.     rivaroxaban  (XARELTO ) 20 MG TABS tablet TAKE 1 TABLET BY MOUTH EVERY DAY WITH SUPPER 30 tablet 6   Semaglutide (OZEMPIC, 0.25 OR 0.5 MG/DOSE, [place]) Inject into the skin.      simvastatin  (ZOCOR ) 10 MG tablet Take 1 tablet (10 mg total) by mouth at bedtime. 30 tablet 0   sulfamethoxazole-trimethoprim (BACTRIM DS) 800-160 MG tablet      traZODone (DESYREL) 50 MG tablet Take 50 mg by mouth at bedtime as needed for sleep.     No current facility-administered medications for this visit.    Past Medical History:  Diagnosis Date   Arthritis    back   Asthma    reactive airway around cats and dogs   Bilateral carpal tunnel syndrome    Cancer (HCC)    Skin   GERD (gastroesophageal reflux disease)    Glaucoma    Hypertension    Paroxysmal atrial fibrillation (HCC)    PONV (postoperative nausea and vomiting)    Premature ventricular contraction    Status post placement of implantable loop recorder     Past Surgical History:  Procedure Laterality Date   ATRIAL FIBRILLATION ABLATION N/A 08/09/2022   Procedure: ATRIAL FIBRILLATION ABLATION;  Surgeon: Inocencio Soyla Lunger, MD;  [facility]: [facility];  Service: Cardiovascular;  Laterality: N/A;   BREAST EXCISIONAL BIOPSY Left 1996   BREAST EXCISIONAL BIOPSY Right 1993   BREAST LUMPECTOMY     CATARACT EXTRACTION     COLONOSCOPY N/A 09/05/2015   Procedure: COLONOSCOPY;  Surgeon: Margo LITTIE Haddock, MD;  [facility];  Service: Endoscopy;  Laterality: N/A;  incomplete colonoscopy   COLONOSCOPY WITH PROPOFOL  N/A 11/01/2015   DOQ:pwuzmwjo hemorrhoids/five polyps/mild diverticulosis, tubular adenomas.  Next colonoscopy 3 years with propofol  with overtube.   COLONOSCOPY WITH PROPOFOL  N/A 11/02/2019   Procedure: COLONOSCOPY WITH PROPOFOL ;  Surgeon: Haddock Margo LITTIE, MD;  [facility];  Service: Endoscopy;  Laterality: N/A;  2:00pm with overtube   EYE SURGERY     HYSTEROSCOPY WITH D & C N/A 12/31/2016   Procedure: DILATATION AND CURETTAGE /HYSTEROSCOPY, POSSIBLE REMOVAL OF ENDOMETRIAL LESION;  Surgeon: Debby Lares, MD;  Location: [facility];  Service: Gynecology;  Laterality: N/A;   implantable loop  recorder placement  09/28/2019   Medtronic Reveal [place] model [id number] ([place] [id number]) implantable loop recorder implanted by Dr Kelsie in office    Maxiofacial     POLYPECTOMY  11/01/2015   Procedure: POLYPECTOMY;  Surgeon: Margo LITTIE Haddock, MD;  Location: [facility];  Service: Endoscopy;;   POLYPECTOMY  11/02/2019   Procedure: POLYPECTOMY;  Surgeon: Haddock Margo LITTIE, MD;  [facility];  Service: Endoscopy;;   RETINAL DETACHMENT SURGERY  1997  1999   TUBAL LIGATION      Family History  Problem Relation Age of Onset   Cancer Other    Heart failure Other    Colon cancer Mother    Colon cancer Maternal Grandmother    Colon polyps Neg Hx     Allergies as  of 10/21/2024 - Review Complete 09/01/2024  Allergen Reaction Noted   Augmentin [amoxicillin-pot clavulanate] Diarrhea and Nausea Only 07/30/2022   Brimonidine  09/30/2023   Ciprofloxacin  Diarrhea and Nausea Only 07/30/2022   Other  10/29/2015    Social History   Socioeconomic History   Marital status: Widowed    Spouse name: [name]   Number of children: 1   Years of education: Not on file   Highest education level: Not on file  Occupational History   Occupation: [employer]    Employer: OTHER  Tobacco Use   Smoking status: Former    Current packs/day: 0.00    Average packs/day: 1 pack/day for 10.0 years (10.0 ttl pk-yrs)    Types: Cigarettes    Start date: 12/17/1966    Quit date: 12/17/1976    Years since quitting: 47.8   Smokeless tobacco: Never   Tobacco comments:    Former smoker (quit 63yrs ago) 10/31/23  Vaping Use   Vaping status: Never Used  Substance and Sexual Activity   Alcohol  use: No    Alcohol /week: 0.0 standard drinks of alcohol    Drug use: No   Sexual activity: Not on file  Other Topics Concern   Not on file  Social History Narrative   Pt gets regular exercise. IS A retired DESIGNER, [employer] AND WORKS FOR [employer]. WORKED FOR [employer]. KIDS: [place],  [place] WAS IN THE [employer].   Social Drivers of Corporate Investment Banker Strain: Not on file  Food Insecurity: Not on file  Transportation Needs: Not on file  Physical Activity: Not on file  Stress: Not on file  Social Connections: Not on file  Intimate Partner Violence: Not on file    Review of Systems   Gen: Denies any fever, chills, fatigue, weight loss, lack of appetite.  CV: Denies chest pain, heart palpitations, peripheral edema, syncope.  Resp: Denies shortness of breath at rest or with exertion. Denies wheezing or cough.  GI: see HPI GU : Denies urinary burning, urinary frequency, urinary hesitancy MS: Denies joint pain, muscle weakness, cramps, or limitation of movement.  Derm: Denies rash, itching, dry skin Psych: Denies depression, anxiety, memory loss, and confusion Heme: Denies bruising, bleeding, and enlarged lymph nodes.  Physical Exam   There were no vitals taken for this visit.  General:   Alert and oriented. Pleasant and cooperative. Well-nourished and well-developed.  Head:  Normocephalic and atraumatic. Eyes:  Without icterus, sclera clear and conjunctiva pink.  Ears:  Normal auditory acuity. Mouth:  No deformity or lesions, oral mucosa pink.  Lungs:  Clear to auscultation bilaterally. No wheezes, rales, or rhonchi. No distress.  Heart:  S1, S2 present without murmurs appreciated.  Abdomen:  +BS, soft, non-tender and non-distended. No HSM noted. No guarding or rebound. No masses appreciated.  Rectal:  deferred *** Msk:  Symmetrical without gross deformities. Normal posture. Extremities:  Without edema. Neurologic:  Alert and  oriented x4;  grossly normal neurologically. Skin:  Intact without significant lesions or rashes. Psych:  Alert and cooperative. Normal mood and affect.  Assessment & Plan   Heather Cunningham is a 73 y.o. female with a history of paroxysmal A-fib with implantable loop recorder on Xarelto , skin cancer, asthma, arthritis, GERD, and HTN  presenting today with ***   Proceed with colonoscopy with propofol  by Dr. Cindie  in near future: the risks, benefits, and alternatives have been discussed with the patient in detail. The patient states understanding and  desires to proceed. ASA 3  Cardiac clearance to hold Xarelto  for 2 days prior   Follow up   ***Follow up ***   Charmaine Melia, MSN, FNP-BC, AGACNP-BC [facility]

## 2024-10-21 ENCOUNTER — Encounter: Payer: Self-pay | Admitting: Gastroenterology

## 2024-10-21 ENCOUNTER — Ambulatory Visit: Admitting: Gastroenterology

## 2024-10-21 ENCOUNTER — Telehealth: Payer: Self-pay | Admitting: *Deleted

## 2024-10-21 VITALS — BP 127/78 | HR 69 | Temp 97.9°F | Ht 64.0 in | Wt 165.8 lb

## 2024-10-21 DIAGNOSIS — I471 Supraventricular tachycardia, unspecified: Secondary | ICD-10-CM | POA: Diagnosis not present

## 2024-10-21 DIAGNOSIS — K5901 Slow transit constipation: Secondary | ICD-10-CM

## 2024-10-21 DIAGNOSIS — R195 Other fecal abnormalities: Secondary | ICD-10-CM

## 2024-10-21 DIAGNOSIS — Z800 Family history of malignant neoplasm of digestive organs: Secondary | ICD-10-CM

## 2024-10-21 DIAGNOSIS — Z95818 Presence of other cardiac implants and grafts: Secondary | ICD-10-CM | POA: Diagnosis not present

## 2024-10-21 DIAGNOSIS — K219 Gastro-esophageal reflux disease without esophagitis: Secondary | ICD-10-CM

## 2024-10-21 DIAGNOSIS — Z860100 Personal history of colon polyps, unspecified: Secondary | ICD-10-CM | POA: Diagnosis not present

## 2024-10-21 DIAGNOSIS — I480 Paroxysmal atrial fibrillation: Secondary | ICD-10-CM

## 2024-10-21 NOTE — Patient Instructions (Addendum)
 We will get you scheduled for colonoscopy after the first of year per your request after we receive clearance from cardiology about the safety of performing the procedure and holding your Xarelto .  Continue to avoid spicy foods or high fat content foods to avoid loose stools afterwards.  Continue omeprazole  40 mg twice daily for control of your reflux.  I try to reiterate a GERD diet to all my patients with reflux on PPIs.  Follow a GERD diet:  Avoid fried, fatty, greasy, spicy, citrus foods. Avoid caffeine and carbonated beverages. Avoid chocolate. Try eating 4-6 small meals a day rather than 3 large meals. Do not eat within 3 hours of laying down. Prop head of bed up on wood or bricks to create a 6 inch incline.    It was a pleasure to see you today. I want to create trusting relationships with patients. If you receive a survey regarding your visit,  I greatly appreciate you taking time to fill this out on paper or through your MyChart. I value your feedback.  Charmaine Melia, MSN, FNP-BC, AGACNP-BC [facility]

## 2024-10-22 ENCOUNTER — Encounter

## 2024-10-22 DIAGNOSIS — M7062 Trochanteric bursitis, left hip: Secondary | ICD-10-CM | POA: Diagnosis not present

## 2024-10-22 NOTE — Progress Notes (Signed)
 Remote Loop Recorder Transmission

## 2024-10-28 ENCOUNTER — Ambulatory Visit (HOSPITAL_COMMUNITY)
Admission: RE | Admit: 2024-10-28 | Discharge: 2024-10-28 | Disposition: A | Discharge status: Home / Self Care | Source: Ambulatory Visit | Attending: Physician Assistant | Admitting: Physician Assistant

## 2024-10-28 VITALS — BP 138/84 | HR 67 | Ht 64.0 in | Wt 168.6 lb

## 2024-10-28 DIAGNOSIS — I4891 Unspecified atrial fibrillation: Secondary | ICD-10-CM | POA: Insufficient documentation

## 2024-10-28 DIAGNOSIS — I10 Essential (primary) hypertension: Secondary | ICD-10-CM | POA: Diagnosis present

## 2024-10-28 DIAGNOSIS — D6869 Other thrombophilia: Secondary | ICD-10-CM | POA: Diagnosis present

## 2024-10-28 DIAGNOSIS — I480 Paroxysmal atrial fibrillation: Secondary | ICD-10-CM | POA: Insufficient documentation

## 2024-10-28 DIAGNOSIS — R002 Palpitations: Secondary | ICD-10-CM | POA: Diagnosis not present

## 2024-10-28 MED ORDER — METOPROLOL TARTRATE 50 MG PO TABS: 100.0000 mg | ORAL_TABLET | Freq: Two times a day (BID) | ORAL | Status: DC

## 2024-10-28 NOTE — Progress Notes (Signed)
 Primary Care Physician: Atilano Deward ORN, MD Primary Electrophysiologist: Dr Inocencio  Referring Physician: Device clinic/Renee Leverne PA   Heather Cunningham is a 73 y.o. female with a history of paroxysmal atrial fibrillation and HTN who presents for follow up in the [facility].  The patient was initially diagnosed with atrial fibrillation remotely and has been maintained on PRN diltiazem . Patient is on Xarelto  for stroke prevention. She underwent ILR implant on 09/2019 for afib management. She was started on Multaq  which did well controlling her afib. She saw Dr Inocencio to discuss ablation in order to get off AAD.   Patient is s/p afib ablation with Dr Inocencio on 08/09/22.   Patient returns for follow up for atrial fibrillation. She is in SR today and feels well. Her ILR shows 0% afib burden. She denies any bleeding issues on anticoagulation. She does have brief palpitations in the middle of the night. She admits to waking herself up snoring and is fatigued during the day.   Today, she  denies symptoms of palpitations, chest pain, shortness of breath, orthopnea, PND, lower extremity edema, dizziness, presyncope, syncope, bleeding, or neurologic sequela. The patient is tolerating medications without difficulties and is otherwise without complaint today.    Atrial Fibrillation Risk Factors:  she does have symptoms or diagnosis of sleep apnea. she does not have a history of rheumatic fever. she does not have a history of alcohol  use. The patient does not have a history of early familial atrial fibrillation or other arrhythmias.   Atrial Fibrillation Management history:  Previous antiarrhythmic drugs: Multaq  Previous cardioversions: none Previous ablations: 08/09/22 Anticoagulation history: Xarelto    Past Medical History:  Diagnosis Date   Arthritis    back   Asthma    reactive airway around cats and dogs   Bilateral carpal tunnel syndrome    Cancer (HCC)     Skin   GERD (gastroesophageal reflux disease)    Glaucoma    Hypertension    Paroxysmal atrial fibrillation (HCC)    PONV (postoperative nausea and vomiting)    Premature ventricular contraction    Status post placement of implantable loop recorder     Current Outpatient Medications  Medication Sig Dispense Refill   acetaminophen  (TYLENOL ) 325 MG tablet Take 650 mg by mouth every 6 (six) hours as needed for mild pain. (Patient taking differently: Take 650 mg by mouth as needed for mild pain (pain score 1-3).)     albuterol (VENTOLIN HFA) 108 (90 Base) MCG/ACT inhaler Inhale 2 puffs into the lungs every 6 (six) hours as needed for wheezing or shortness of breath.      calcium carbonate (TUMS - DOSED IN MG ELEMENTAL CALCIUM) 500 MG chewable tablet Chew 1 tablet by mouth daily as needed for heartburn. er (Patient taking differently: Chew 1 tablet by mouth as needed for heartburn. er)     Calcium Carbonate-Vitamin D 600-400 MG-UNIT tablet Take 1 tablet by mouth 2 (two) times daily.     Carboxymethylcellulose Sodium (ARTIFICIAL TEARS OP) Place 1 drop into both eyes daily. (Patient taking differently: Place 1 drop into both eyes as needed.)     diltiazem  (CARDIZEM ) 30 MG tablet Take 1 tablet (30 mg total) by mouth 4 (four) times daily as needed. (Patient taking differently: Take 30 mg by mouth as needed.) 30 tablet 2   docusate sodium (COLACE) 100 MG capsule Take 100 mg by mouth daily as needed for mild constipation or moderate constipation.  latanoprost (XALATAN) 0.005 % ophthalmic solution Place 1 drop into both eyes at bedtime.     lisinopril -hydrochlorothiazide  (ZESTORETIC ) 10-12.5 MG tablet Take 1 tablet by mouth daily. 30 tablet 6   meclizine (ANTIVERT) 25 MG tablet Take 25 mg by mouth 3 (three) times daily as needed for dizziness.     Melatonin 3 MG CAPS Take 3 mg by mouth at bedtime as needed (sleep).     metoprolol  tartrate (LOPRESSOR ) 50 MG tablet Take 1.5 tablets (75 mg total) by mouth  2 (two) times daily. 90 tablet 6   omeprazole  (PRILOSEC) 40 MG capsule Take 40 mg by mouth 2 (two) times daily with a meal.     rivaroxaban  (XARELTO ) 20 MG TABS tablet TAKE 1 TABLET BY MOUTH EVERY DAY WITH SUPPER 30 tablet 6   Semaglutide (OZEMPIC, 0.25 OR 0.5 MG/DOSE, [place]) Inject into the skin. (Patient taking differently: Inject 0.25 mg into the skin once a week.)     simvastatin  (ZOCOR ) 10 MG tablet Take 1 tablet (10 mg total) by mouth at bedtime. 30 tablet 0   timolol (TIMOPTIC) 0.5 % ophthalmic solution 1 drop daily.     traZODone (DESYREL) 50 MG tablet Take 50 mg by mouth at bedtime as needed for sleep.     sulfamethoxazole-trimethoprim (BACTRIM DS) 800-160 MG tablet  (Patient not taking: Reported on 10/21/2024)     No current facility-administered medications for this encounter.    ROS- All systems are reviewed and negative except as per the HPI above.  Physical Exam: Vitals:   10/28/24 1110  BP: 138/84  Pulse: 67  Weight: 76.5 kg  Height: 5' 4 (1.626 m)    GEN: Well nourished, well developed in no acute distress CARDIAC: Regular rate and rhythm, no murmurs, rubs, gallops RESPIRATORY:  Clear to auscultation without rales, wheezing or rhonchi  ABDOMEN: Soft, non-tender, non-distended EXTREMITIES:  No edema; No deformity    Wt Readings from Last 3 Encounters:  10/28/24 76.5 kg  10/21/24 75.2 kg  04/30/24 74.8 kg    EKG today demonstrates  SR, 1st degree AV block Vent. rate 67 BPM PR interval 208 ms QRS duration 72 ms QT/QTcB 404/426 ms   Echo 11/30/15 demonstrated  - Left ventricle: The cavity size was normal. There was mild    concentric hypertrophy. Systolic function was normal. The    estimated ejection fraction was in the range of 60% to 65%. Wall    motion was normal; there were no regional wall motion    abnormalities. Left ventricular diastolic function parameters    were normal.  - Aortic valve: Trileaflet; normal thickness leaflets. There was no     regurgitation.  - Aortic root: The aortic root was normal in size.  - Ascending aorta: The ascending aorta was normal in size.  - Mitral valve: Structurally normal valve. There was no    regurgitation.  - Left atrium: The atrium was normal in size.  - Right ventricle: The cavity size was normal. Wall thickness was    normal. Systolic function was normal.  - Right atrium: The atrium was normal in size.  - Tricuspid valve: There was trivial regurgitation.  - Pulmonic valve: There was no regurgitation.  - Pulmonary arteries: Systolic pressure was within the normal    range.  - Inferior vena cava: The vessel was normal in size.  - Pericardium, extracardiac: There was no pericardial effusion.   Impressions:   - Normal study.   Epic records are reviewed at length today  CHA2DS2-VASc Score = 5  The patient's score is based upon: CHF History: 0 HTN History: 1 Diabetes History: 0 Stroke History: 0 Vascular Disease History: 1 Age Score: 2 Gender Score: 1       ASSESSMENT AND PLAN: Paroxysmal Atrial Fibrillation (ICD10:  I48.0) The patient's CHA2DS2-VASc score is 5, indicating a 7.2% annual risk of stroke.   S/p afib ablation 08/09/22 ILR shows 0% afib burden. Continue diltiazem  120 mg daily with 30 mg PRN q 4 hours for heart racing.  Increase Lopressor  to 100 mg BID Continue Xarelto  20 mg daily  Secondary Hypercoagulable State (ICD10:  D68.69) The patient is at significant risk for stroke/thromboembolism based upon her CHA2DS2-VASc Score of 5.  Continue Rivaroxaban  (Xarelto ). No bleeding issues.   HTN Stable but patient reports her diastolic readings are occasionally elevated.  Increase BB to 100 mg BID as above.   CAD CAC score 421 No anginal symptoms  Snoring/daytime somnolence The importance of adequate treatment of sleep apnea was discussed today in order to improve our ability to maintain sinus rhythm long term. She has deferred sleep study for now.     Follow  up with Dr Inocencio in 6 months and AF clinic in one year.    Daril Kicks PA-C [facility] [address] [place], [place] [address] [phone] 10/28/2024 11:49 AM

## 2024-11-06 DIAGNOSIS — Z131 Encounter for screening for diabetes mellitus: Secondary | ICD-10-CM | POA: Diagnosis not present

## 2024-11-06 DIAGNOSIS — Z13220 Encounter for screening for lipoid disorders: Secondary | ICD-10-CM | POA: Diagnosis not present

## 2024-11-06 DIAGNOSIS — R7889 Finding of other specified substances, not normally found in blood: Secondary | ICD-10-CM | POA: Diagnosis not present

## 2024-11-09 ENCOUNTER — Other Ambulatory Visit (HOSPITAL_COMMUNITY): Payer: Self-pay | Admitting: *Deleted

## 2024-11-09 MED ORDER — METOPROLOL TARTRATE 100 MG PO TABS: 100.0000 mg | ORAL_TABLET | Freq: Two times a day (BID) | ORAL | 11 refills | Status: AC

## 2024-11-10 DIAGNOSIS — K2100 Gastro-esophageal reflux disease with esophagitis, without bleeding: Secondary | ICD-10-CM | POA: Diagnosis not present

## 2024-11-10 DIAGNOSIS — I10 Essential (primary) hypertension: Secondary | ICD-10-CM | POA: Diagnosis not present

## 2024-11-10 DIAGNOSIS — Z6828 Body mass index (BMI) 28.0-28.9, adult: Secondary | ICD-10-CM | POA: Diagnosis not present

## 2024-11-10 DIAGNOSIS — M1612 Unilateral primary osteoarthritis, left hip: Secondary | ICD-10-CM | POA: Diagnosis not present

## 2024-11-10 DIAGNOSIS — I482 Chronic atrial fibrillation, unspecified: Secondary | ICD-10-CM | POA: Diagnosis not present

## 2024-11-10 DIAGNOSIS — E1165 Type 2 diabetes mellitus with hyperglycemia: Secondary | ICD-10-CM | POA: Diagnosis not present

## 2024-11-10 DIAGNOSIS — M810 Age-related osteoporosis without current pathological fracture: Secondary | ICD-10-CM | POA: Diagnosis not present

## 2024-11-10 DIAGNOSIS — M5090 Cervical disc disorder, unspecified, unspecified cervical region: Secondary | ICD-10-CM | POA: Diagnosis not present

## 2024-11-18 DIAGNOSIS — R300 Dysuria: Secondary | ICD-10-CM | POA: Diagnosis not present

## 2024-11-18 DIAGNOSIS — N390 Urinary tract infection, site not specified: Secondary | ICD-10-CM | POA: Diagnosis not present

## 2024-11-18 DIAGNOSIS — Z6828 Body mass index (BMI) 28.0-28.9, adult: Secondary | ICD-10-CM | POA: Diagnosis not present

## 2024-11-19 ENCOUNTER — Encounter

## 2024-11-21 ENCOUNTER — Ambulatory Visit: Attending: Cardiology

## 2024-11-21 DIAGNOSIS — I4891 Unspecified atrial fibrillation: Secondary | ICD-10-CM | POA: Diagnosis not present

## 2024-11-23 ENCOUNTER — Encounter

## 2024-11-23 LAB — CUP PACEART REMOTE DEVICE CHECK
Date Time Interrogation Session: 20251205231055
Implantable Pulse Generator Implant Date: 20201012

## 2024-11-24 NOTE — Telephone Encounter (Signed)
  Request for patient to stop medication prior to procedure or is needing cleareance  11/24/24  Heather Cunningham 04/03/1951  What type of surgery is being performed? Colonscopy  When is surgery scheduled? TBD  What type of clearance is required (medical or pharmacy to hold medication or both? BOTH (medical and medication)  Patient is needing to have cardiac clearance and clearance to hold Xarelto  x 2 days prior to procedure.  Name of physician performing surgery?  Dr. Cindie Rouse [facility] at [facility] Phone: [phone], option 5 Fax: [phone]  Anesthesia type (none, local, MAC, general)? MAC   ? Yes ? No Patient can hold medication as requested   Signature: ___________________________

## 2024-11-25 ENCOUNTER — Ambulatory Visit: Payer: Self-pay | Admitting: Cardiology

## 2024-11-25 NOTE — Progress Notes (Signed)
 Remote Loop Recorder Transmission

## 2024-12-01 NOTE — Telephone Encounter (Addendum)
° °  Patient Name: Heather Cunningham  DOB: 1951-12-10 MRN: [id number]  Primary Cardiologist: Pearla Rout, MD (Inactive)  Chart reviewed as part of pre-operative protocol coverage. Pre-op clearance already addressed by colleagues in earlier phone notes. To summarize recommendations:  -Per office protocol, patient can hold Xarelto  for 2 days prior to procedure.  She can resume when medically safe to do so.  She was recently seen in the office and doing well at that time.  Will route this bundled recommendation to requesting provider via Epic fax function and remove from pre-op pool. Please call with questions.  Orren LOISE Fabry, PA-C 12/01/2024, 10:39 AM

## 2024-12-01 NOTE — Telephone Encounter (Signed)
 Patient with diagnosis of afib on Xarelto  for anticoagulation.    Procedure: colonoscopy Date of procedure: TBD   CHA2DS2-VASc Score = 5   This indicates a 7.2% annual risk of stroke. The patient's score is based upon: CHF History: 0 HTN History: 1 Diabetes History: 0 Stroke History: 0 Vascular Disease History: 1 Age Score: 2 Gender Score: 1      CrCl 63 ml/min  Patient has not had an Afib/aflutter ablation in the last 3 months, DCCV within the last 4 weeks or a watchman implanted in the last 45 days    Per office protocol, patient can hold Xarelto  for 2 days prior to procedure.    **This guidance is not considered finalized until pre-operative APP has relayed final recommendations.**

## 2024-12-02 ENCOUNTER — Other Ambulatory Visit: Payer: Self-pay | Admitting: Gastroenterology

## 2024-12-02 DIAGNOSIS — Z1211 Encounter for screening for malignant neoplasm of colon: Secondary | ICD-10-CM

## 2024-12-02 DIAGNOSIS — Z800 Family history of malignant neoplasm of digestive organs: Secondary | ICD-10-CM

## 2024-12-02 DIAGNOSIS — Z860100 Personal history of colon polyps, unspecified: Secondary | ICD-10-CM

## 2024-12-02 NOTE — Telephone Encounter (Signed)
 Called to schedule pt. She says she has talked with her doctor and has decided to do the cologuard.

## 2024-12-03 NOTE — Telephone Encounter (Signed)
 LMOVM to return call.

## 2024-12-03 NOTE — Telephone Encounter (Signed)
Pt informed of providers message and recommendations. Verbalized understanding

## 2024-12-07 ENCOUNTER — Other Ambulatory Visit (HOSPITAL_COMMUNITY): Payer: Self-pay | Admitting: Internal Medicine

## 2024-12-20 LAB — COLOGUARD: COLOGUARD: POSITIVE — AB

## 2024-12-21 ENCOUNTER — Encounter

## 2024-12-22 ENCOUNTER — Ambulatory Visit

## 2024-12-22 DIAGNOSIS — I4891 Unspecified atrial fibrillation: Secondary | ICD-10-CM | POA: Diagnosis not present

## 2024-12-22 LAB — CUP PACEART REMOTE DEVICE CHECK
Date Time Interrogation Session: 20260105230446
Implantable Pulse Generator Implant Date: 20201012

## 2024-12-23 ENCOUNTER — Ambulatory Visit: Payer: Self-pay | Admitting: Cardiology

## 2024-12-23 ENCOUNTER — Ambulatory Visit: Payer: Self-pay | Admitting: Gastroenterology

## 2024-12-24 ENCOUNTER — Encounter

## 2024-12-24 ENCOUNTER — Encounter: Payer: Self-pay | Admitting: *Deleted

## 2024-12-24 ENCOUNTER — Other Ambulatory Visit: Payer: Self-pay | Admitting: *Deleted

## 2024-12-24 MED ORDER — PEG 3350-KCL-NA BICARB-NACL 420 G PO SOLR: 4000.0000 mL | Freq: Once | ORAL | 0 refills | Status: AC

## 2024-12-24 NOTE — Progress Notes (Signed)
 Remote Loop Recorder Transmission

## 2024-12-28 ENCOUNTER — Other Ambulatory Visit: Payer: Self-pay | Admitting: Family Medicine

## 2024-12-28 DIAGNOSIS — Z1231 Encounter for screening mammogram for malignant neoplasm of breast: Secondary | ICD-10-CM

## 2024-12-31 ENCOUNTER — Other Ambulatory Visit: Payer: Self-pay

## 2024-12-31 ENCOUNTER — Encounter (HOSPITAL_COMMUNITY): Payer: Self-pay

## 2024-12-31 ENCOUNTER — Encounter (HOSPITAL_COMMUNITY)
Admission: RE | Admit: 2024-12-31 | Discharge: 2024-12-31 | Disposition: A | Discharge status: Home / Self Care | Source: Ambulatory Visit | Attending: Internal Medicine | Admitting: Internal Medicine

## 2024-12-31 HISTORY — DX: Type 2 diabetes mellitus without complications: E11.9

## 2025-01-05 ENCOUNTER — Encounter (HOSPITAL_COMMUNITY): Payer: Self-pay | Admitting: Internal Medicine

## 2025-01-05 ENCOUNTER — Ambulatory Visit (HOSPITAL_COMMUNITY)

## 2025-01-05 ENCOUNTER — Encounter (HOSPITAL_COMMUNITY)
Admission: RE | Disposition: A | Discharge status: Home / Self Care | Payer: Self-pay | Source: Home / Self Care | Attending: Internal Medicine

## 2025-01-05 ENCOUNTER — Ambulatory Visit (HOSPITAL_COMMUNITY)
Admission: RE | Admit: 2025-01-05 | Discharge: 2025-01-05 | Disposition: A | Discharge status: Home / Self Care | Attending: Internal Medicine | Admitting: Internal Medicine

## 2025-01-05 DIAGNOSIS — I10 Essential (primary) hypertension: Secondary | ICD-10-CM | POA: Insufficient documentation

## 2025-01-05 DIAGNOSIS — K648 Other hemorrhoids: Secondary | ICD-10-CM | POA: Diagnosis not present

## 2025-01-05 DIAGNOSIS — D123 Benign neoplasm of transverse colon: Secondary | ICD-10-CM | POA: Insufficient documentation

## 2025-01-05 DIAGNOSIS — K635 Polyp of colon: Secondary | ICD-10-CM

## 2025-01-05 DIAGNOSIS — R195 Other fecal abnormalities: Secondary | ICD-10-CM | POA: Diagnosis not present

## 2025-01-05 DIAGNOSIS — K219 Gastro-esophageal reflux disease without esophagitis: Secondary | ICD-10-CM | POA: Insufficient documentation

## 2025-01-05 DIAGNOSIS — E119 Type 2 diabetes mellitus without complications: Secondary | ICD-10-CM | POA: Insufficient documentation

## 2025-01-05 DIAGNOSIS — Z860101 Personal history of adenomatous and serrated colon polyps: Secondary | ICD-10-CM | POA: Diagnosis not present

## 2025-01-05 DIAGNOSIS — Z87891 Personal history of nicotine dependence: Secondary | ICD-10-CM | POA: Insufficient documentation

## 2025-01-05 DIAGNOSIS — D122 Benign neoplasm of ascending colon: Secondary | ICD-10-CM | POA: Insufficient documentation

## 2025-01-05 DIAGNOSIS — Z1211 Encounter for screening for malignant neoplasm of colon: Secondary | ICD-10-CM | POA: Insufficient documentation

## 2025-01-05 DIAGNOSIS — I2510 Atherosclerotic heart disease of native coronary artery without angina pectoris: Secondary | ICD-10-CM | POA: Diagnosis not present

## 2025-01-05 HISTORY — PX: COLONOSCOPY: SHX5424

## 2025-01-05 HISTORY — PX: POLYPECTOMY: SHX149

## 2025-01-05 LAB — HM COLONOSCOPY

## 2025-01-05 LAB — GLUCOSE, CAPILLARY: Glucose-Capillary: 119 mg/dL — ABNORMAL HIGH (ref 70–99)

## 2025-01-05 MED ORDER — LACTATED RINGERS IV SOLN: INTRAVENOUS | Status: DC

## 2025-01-05 MED ORDER — PROPOFOL 500 MG/50ML IV EMUL
INTRAVENOUS | Status: DC | PRN
  Administered 2025-01-05: 200 ug/kg/min via INTRAVENOUS

## 2025-01-05 MED ORDER — LACTATED RINGERS IV SOLN: INTRAVENOUS | Status: DC | PRN

## 2025-01-05 MED ORDER — PROPOFOL 10 MG/ML IV BOLUS
INTRAVENOUS | Status: DC | PRN
  Administered 2025-01-05: 50 mg via INTRAVENOUS

## 2025-01-05 NOTE — Op Note (Signed)
 [facility] Patient Name: Heather Cunningham Procedure Date: 01/05/2025 12:53 PM MRN: [id number] Date of Birth: [birth year] Attending MD: Carlin POUR. Cindie , [place], [id number] CSN: [id number] Age: 74 Admit Type: Outpatient Procedure:                Colonoscopy Indications:              Positive Cologuard test Providers:                Carlin POUR. Cindie, DO, Ashley Goins, Daphne Mulch                            Technician, Technician Referring MD:              Medicines:                See the Anesthesia note for documentation of the                            administered medications Complications:            No immediate complications. Estimated Blood Loss:     Estimated blood loss was minimal. Procedure:                Pre-Anesthesia Assessment:                           - The anesthesia plan was to use monitored                            anesthesia care (MAC).                           After obtaining informed consent, the colonoscope                            was passed under direct vision. Throughout the                            procedure, the patient's blood pressure, pulse, and                            oxygen saturations were monitored continuously. The                            [id number]-[id number] ([id number]) Peds Colon was introduced                            through the anus and advanced to the the cecum,                            identified by appendiceal orifice and ileocecal                            valve. The colonoscopy was performed without                            difficulty. The patient tolerated the procedure  well. The quality of the bowel preparation was                            evaluated using the BBPS [facility] Bowel Preparation                            Scale) with scores of: Right Colon = 3, Transverse                            Colon = 3 and Left Colon = 3 (entire mucosa seen                            well with no residual staining, small  fragments of                            stool or opaque liquid). The total BBPS score                            equals 9. Scope In: 1:08:19 PM Scope Out: 1:25:45 PM Scope Withdrawal Time: 0 hours 11 minutes 3 seconds  Total Procedure Duration: 0 hours 17 minutes 26 seconds  Findings:      Non-bleeding internal hemorrhoids were found.      A 6 mm polyp was found in the ileocecal valve. The polyp was sessile.       The polyp was removed with a cold snare. Resection and retrieval were       complete.      A 12-14 mm polyp was found in the transverse colon. The polyp was       sessile. The polyp was removed with a cold snare. Resection and       retrieval were complete.      The exam was otherwise without abnormality. Impression:               - Non-bleeding internal hemorrhoids.                           - One 6 mm polyp at the ileocecal valve, removed                            with a cold snare. Resected and retrieved.                           - One 12 mm polyp in the transverse colon, removed                            with a cold snare. Resected and retrieved.                           - The examination was otherwise normal. Moderate Sedation:      Per Anesthesia Care Recommendation:           - Patient has a contact number available for  emergencies. The signs and symptoms of potential                            delayed complications were discussed with the                            patient. Return to normal activities tomorrow.                            Written discharge instructions were provided to the                            patient.                           - Resume previous diet.                           - Continue present medications.                           - Await pathology results.                           - Repeat colonoscopy in 3 years for surveillance if                            benefits outweigh risks                            - Return to GI clinic PRN. Procedure Code(s):        --- Professional ---                           [id number], Colonoscopy, flexible; with removal of                            tumor(s), polyp(s), or other lesion(s) by snare                            technique Diagnosis Code(s):        --- Professional ---                           D12.0, Benign neoplasm of cecum                           D12.3, Benign neoplasm of transverse colon (hepatic                            flexure or splenic flexure)                           K64.8, Other hemorrhoids                           R19.5, Other fecal abnormalities CPT copyright 2022 American Medical  Association. All rights reserved. The codes documented in this report are preliminary and upon coder review may  be revised to meet current compliance requirements. Carlin POUR. Cindie, DO Carlin POUR. Cindie, DO 01/05/2025 1:32:20 PM This report has been signed electronically. Number of Addenda: 0

## 2025-01-05 NOTE — Anesthesia Postprocedure Evaluation (Signed)
"   Anesthesia Post Note  Patient: Heather Cunningham  Procedure(s) Performed: COLONOSCOPY POLYPECTOMY, INTESTINE  Patient location during evaluation: PACU Anesthesia Type: General Level of consciousness: awake and alert Pain management: pain level controlled Vital Signs Assessment: post-procedure vital signs reviewed and stable Respiratory status: spontaneous breathing, nonlabored ventilation, respiratory function stable and patient connected to nasal cannula oxygen Cardiovascular status: blood pressure returned to baseline and stable Postop Assessment: no apparent nausea or vomiting Anesthetic complications: no   No notable events documented.   Last Vitals:  Vitals:   01/05/25 1331 01/05/25 1334  BP: (!) 98/54 109/65  Pulse: (!) 58   Resp: 19   Temp: 36.7 C   SpO2: 96% 96%    Last Pain:  Vitals:   01/05/25 1334  TempSrc:   PainSc: 0-No pain                 Andrea Limes      "

## 2025-01-05 NOTE — H&P (Signed)
 Primary Care Physician:  Toribio Jerel MATSU, MD Primary Gastroenterologist:  Dr. Cindie  Pre-Procedure History & Physical: HPI:  Heather Cunningham is a 74 y.o. female is here for a colonoscopy to be performed for surveillance purposes, personal history of adenomatous colon polyps   Past Medical History:  Diagnosis Date   Arthritis    back   Asthma    reactive airway around cats and dogs   Bilateral carpal tunnel syndrome    Cancer (HCC)    Skin   Diabetes mellitus without complication (HCC)    GERD (gastroesophageal reflux disease)    Glaucoma    Hypertension    Paroxysmal atrial fibrillation (HCC)    PONV (postoperative nausea and vomiting)    Premature ventricular contraction    Status post placement of implantable loop recorder     Past Surgical History:  Procedure Laterality Date   ATRIAL FIBRILLATION ABLATION N/A 08/09/2022   Procedure: ATRIAL FIBRILLATION ABLATION;  Surgeon: Inocencio Soyla Lunger, MD;  Location: [facility];  Service: Cardiovascular;  Laterality: N/A;   BREAST EXCISIONAL BIOPSY Left 1996   BREAST EXCISIONAL BIOPSY Right 1993   BREAST LUMPECTOMY     CATARACT EXTRACTION Bilateral    COLONOSCOPY N/A 09/05/2015   Procedure: COLONOSCOPY;  Surgeon: Margo LITTIE Haddock, MD;  Location: [facility];  Service: Endoscopy;  Laterality: N/A;  incomplete colonoscopy   COLONOSCOPY WITH PROPOFOL  N/A 11/01/2015   DOQ:pwuzmwjo hemorrhoids/five polyps/mild diverticulosis, tubular adenomas.  Next colonoscopy 3 years with propofol  with overtube.   COLONOSCOPY WITH PROPOFOL  N/A 11/02/2019   Procedure: COLONOSCOPY WITH PROPOFOL ;  Surgeon: Haddock Margo LITTIE, MD;  Location: [facility];  Service: Endoscopy;  Laterality: N/A;  2:00pm with overtube   EYE SURGERY     HYSTEROSCOPY WITH D & C N/A 12/31/2016   Procedure: DILATATION AND CURETTAGE /HYSTEROSCOPY, POSSIBLE REMOVAL OF ENDOMETRIAL LESION;  Surgeon: Debby Lares, MD;  Location: [facility];  Service: Gynecology;  Laterality: N/A;    implantable loop recorder placement  09/28/2019   Medtronic Reveal [place] model LNQ22 ([place] [id number]) implantable loop recorder implanted by Dr Kelsie in office    Maxiofacial     for over bite- 1980   POLYPECTOMY  11/01/2015   Procedure: POLYPECTOMY;  Surgeon: Margo LITTIE Haddock, MD;  Location: [facility];  Service: Endoscopy;;   POLYPECTOMY  11/02/2019   Procedure: POLYPECTOMY;  Surgeon: Haddock Margo LITTIE, MD;  Location: [facility];  Service: Endoscopy;;   RETINAL DETACHMENT SURGERY  1997  1999   TUBAL LIGATION      Prior to Admission medications  Medication Sig Start Date End Date Taking? Authorizing Provider  docusate sodium (COLACE) 100 MG capsule Take 100 mg by mouth daily as needed for mild constipation or moderate constipation.   Yes [provider]  lisinopril -hydrochlorothiazide  (ZESTORETIC ) 10-12.5 MG tablet Take 1 tablet by mouth daily. 12/07/24  Yes Terra Fairy PARAS, PA-C  meclizine (ANTIVERT) 25 MG tablet Take 25 mg by mouth 3 (three) times daily as needed for dizziness.   Yes [provider]  Melatonin 3 MG CAPS Take 3 mg by mouth at bedtime as needed (sleep).   Yes [provider]  metoprolol  tartrate (LOPRESSOR ) 100 MG tablet Take 1 tablet (100 mg total) by mouth 2 (two) times daily. 11/09/24  Yes Fenton, Clint R, PA  simvastatin  (ZOCOR ) 10 MG tablet Take 1 tablet (10 mg total) by mouth at bedtime. 04/11/22  Yes Fenton, Clint R, PA  traZODone (DESYREL) 50 MG tablet  Take 50 mg by mouth at bedtime as needed for sleep. 01/14/22  Yes [provider]  acetaminophen  (TYLENOL ) 325 MG tablet Take 650 mg by mouth every 6 (six) hours as needed for mild pain. Patient taking differently: Take 650 mg by mouth as needed for mild pain (pain score 1-3).    [provider]  albuterol (VENTOLIN HFA) 108 (90 Base) MCG/ACT inhaler Inhale 2 puffs into the lungs every 6 (six) hours as needed for wheezing or shortness of breath.     [provider]   calcium carbonate (TUMS - DOSED IN MG ELEMENTAL CALCIUM) 500 MG chewable tablet Chew 1 tablet by mouth daily as needed for heartburn. er Patient taking differently: Chew 1 tablet by mouth as needed for heartburn. er    [provider]  Calcium Carbonate-Vitamin D 600-400 MG-UNIT tablet Take 1 tablet by mouth 2 (two) times daily.    [provider]  Carboxymethylcellulose Sodium (ARTIFICIAL TEARS OP) Place 1 drop into both eyes daily. Patient taking differently: Place 1 drop into both eyes as needed.    [provider]  diltiazem  (CARDIZEM ) 30 MG tablet Take 1 tablet (30 mg total) by mouth 4 (four) times daily as needed. Patient taking differently: Take 30 mg by mouth as needed. 04/30/24   Fenton, Clint R, PA  latanoprost (XALATAN) 0.005 % ophthalmic solution Place 1 drop into both eyes at bedtime.    [provider]  omeprazole  (PRILOSEC) 40 MG capsule Take 40 mg by mouth 2 (two) times daily with a meal.    [provider]  rivaroxaban  (XARELTO ) 20 MG TABS tablet TAKE 1 TABLET BY MOUTH EVERY DAY WITH SUPPER 10/08/24   Fenton, Clint R, PA  Semaglutide (OZEMPIC, 0.25 OR 0.5 MG/DOSE, [place]) Inject into the skin. Patient taking differently: Inject 0.25 mg into the skin once a week.    [provider]  sulfamethoxazole-trimethoprim (BACTRIM DS) 800-160 MG tablet  10/30/23   [provider]  timolol (TIMOPTIC) 0.5 % ophthalmic solution 1 drop daily. 10/01/24   [provider]    Allergies as of 12/24/2024 - Review Complete 10/28/2024  Allergen Reaction Noted   Augmentin [amoxicillin-pot clavulanate] Diarrhea and Nausea Only 07/30/2022   Brimonidine  09/30/2023   Ciprofloxacin  Diarrhea and Nausea Only 07/30/2022   Other  10/29/2015    Family History  Problem Relation Age of Onset   Cancer Other    Heart failure Other    Colon cancer Mother    Colon cancer Maternal Grandmother    Colon polyps Neg Hx     Social History    Socioeconomic History   Marital status: Widowed    Spouse name: [name]   Number of children: 1   Years of education: Not on file   Highest education level: Not on file  Occupational History   Occupation: [employer]    Employer: OTHER  Tobacco Use   Smoking status: Former    Current packs/day: 0.00    Average packs/day: 1 pack/day for 10.0 years (10.0 ttl pk-yrs)    Types: Cigarettes    Start date: 12/17/1966    Quit date: 12/17/1976    Years since quitting: 48.0   Smokeless tobacco: Never   Tobacco comments:    Former smoker (quit 35yrs ago) 10/31/23  Vaping Use   Vaping status: Never Used  Substance and Sexual Activity   Alcohol  use: No    Alcohol /week: 0.0 standard drinks of alcohol    Drug use: No  Sexual activity: Not on file  Other Topics Concern   Not on file  Social History Narrative   Pt gets regular exercise. IS A retired DESIGNER, [employer] AND WORKS FOR [employer]. WORKED FOR [employer]. KIDS: [place], [place] WAS IN THE [employer].   Social Drivers of Health   Tobacco Use: Medium Risk (01/05/2025)   Patient History    Smoking Tobacco Use: Former    Smokeless Tobacco Use: Never    Passive Exposure: Not on file  Financial Resource Strain: Not on file  Food Insecurity: Not on file  Transportation Needs: Not on file  Physical Activity: Not on file  Stress: Not on file  Social Connections: Not on file  Intimate Partner Violence: Not on file  Depression (PHQ2-9): Not on file  Alcohol  Screen: Not on file  Housing: Not on file  Utilities: Not on file  Health Literacy: Not on file    Review of Systems: See HPI, otherwise negative ROS  Physical Exam: Vital signs in last 24 hours: Temp:  [97.8 F (36.6 C)] 97.8 F (36.6 C) (01/20 1209) Pulse Rate:  [58] 58 (01/20 1209) Resp:  [16] 16 (01/20 1209) BP: (143)/(66) 143/66 (01/20 1209) SpO2:  [97 %] 97 % (01/20 1209) Weight:  [76.5 kg] 76.5 kg (01/20 1209)   General:    Alert,  Well-developed, well-nourished, pleasant and cooperative in NAD Head:  Normocephalic and atraumatic. Eyes:  Sclera clear, no icterus.   Conjunctiva pink. Ears:  Normal auditory acuity. Nose:  No deformity, discharge,  or lesions. Msk:  Symmetrical without gross deformities. Normal posture. Extremities:  Without clubbing or edema. Neurologic:  Alert and  oriented x4;  grossly normal neurologically. Skin:  Intact without significant lesions or rashes. Psych:  Alert and cooperative. Normal mood and affect.  Impression/Plan: Heather Cunningham is here for a colonoscopy to be performed for surveillance purposes, personal history of adenomatous colon polyps   The risks of the procedure including infection, bleed, or perforation as well as benefits, limitations, alternatives and imponderables have been reviewed with the patient. Questions have been answered. All parties agreeable.

## 2025-01-05 NOTE — Anesthesia Preprocedure Evaluation (Signed)
"                                    Anesthesia Evaluation  Patient identified by MRN, date of birth, ID band Patient awake    Reviewed: Allergy & Precautions, H&P , NPO status , Patient's Chart, lab work & pertinent test results  History of Anesthesia Complications (+) PONV and history of anesthetic complications  Airway Mallampati: II  TM Distance: >3 FB Neck ROM: Full    Dental no notable dental hx.    Pulmonary asthma , former smoker   Pulmonary exam normal breath sounds clear to auscultation       Cardiovascular hypertension, + CAD  Normal cardiovascular exam+ dysrhythmias Atrial Fibrillation  Rhythm:Regular Rate:Normal  Hx afib s/p ablation   Neuro/Psych  Neuromuscular disease  negative psych ROS   GI/Hepatic Neg liver ROS,GERD  ,,  Endo/Other  diabetes    Renal/GU negative Renal ROS  negative genitourinary   Musculoskeletal  (+) Arthritis ,    Abdominal   Peds negative pediatric ROS (+)  Hematology negative hematology ROS (+)   Anesthesia Other Findings   Reproductive/Obstetrics negative OB ROS                              Anesthesia Physical Anesthesia Plan  ASA: 3  Anesthesia Plan: General   Post-op Pain Management:    Induction: Intravenous  PONV Risk Score and Plan:   Airway Management Planned: Nasal Cannula and Natural Airway  Additional Equipment:   Intra-op Plan:   Post-operative Plan:   Informed Consent: I have reviewed the patients History and Physical, chart, labs and discussed the procedure including the risks, benefits and alternatives for the proposed anesthesia with the patient or authorized representative who has indicated his/her understanding and acceptance.     Dental advisory given  Plan Discussed with: CRNA  Anesthesia Plan Comments:         Anesthesia Quick Evaluation  "

## 2025-01-05 NOTE — Discharge Instructions (Addendum)
" °  Colonoscopy Discharge Instructions  Read the instructions outlined below and refer to this sheet in the next few weeks. These discharge instructions provide you with general information on caring for yourself after you leave the hospital. Your doctor may also give you specific instructions. While your treatment has been planned according to the most current medical practices available, unavoidable complications occasionally occur.   ACTIVITY You may resume your regular activity, but move at a slower pace for the next 24 hours.  Take frequent rest periods for the next 24 hours.  Walking will help get rid of the air and reduce the bloated feeling in your belly (abdomen).  No driving for 24 hours (because of the medicine (anesthesia) used during the test).   Do not sign any important legal documents or operate any machinery for 24 hours (because of the anesthesia used during the test).  NUTRITION Drink plenty of fluids.  You may resume your normal diet as instructed by your doctor.  Begin with a light meal and progress to your normal diet. Heavy or fried foods are harder to digest and may make you feel sick to your stomach (nauseated).  Avoid alcoholic beverages for 24 hours or as instructed.  MEDICATIONS You may resume your normal medications unless your doctor tells you otherwise.  WHAT YOU CAN EXPECT TODAY Some feelings of bloating in the abdomen.  Passage of more gas than usual.  Spotting of blood in your stool or on the toilet paper.  IF YOU HAD POLYPS REMOVED DURING THE COLONOSCOPY: No aspirin products for 7 days or as instructed.  No alcohol  for 7 days or as instructed.  Eat a soft diet for the next 24 hours.  FINDING OUT THE RESULTS OF YOUR TEST Not all test results are available during your visit. If your test results are not back during the visit, make an appointment with your caregiver to find out the results. Do not assume everything is normal if you have not heard from your  caregiver or the medical facility. It is important for you to follow up on all of your test results.  SEEK IMMEDIATE MEDICAL ATTENTION IF: You have more than a spotting of blood in your stool.  Your belly is swollen (abdominal distention).  You are nauseated or vomiting.  You have a temperature over 101.  You have abdominal pain or discomfort that is severe or gets worse throughout the day.   Your colonoscopy revealed 2 polyp(s) which I removed successfully. One of the polyps was >1 cm in size. Await pathology results, my office will contact you. I recommend repeating colonoscopy in 3 years for surveillance purposes, depending on pathology results.  Otherwise follow up with GI as needed.    I hope you have a great rest of your week!  Carlin POUR. Cindie, D.O. Gastroenterology and Hepatology [facility]  Resume taking Xarelto  tomorrow.  "

## 2025-01-05 NOTE — Transfer of Care (Signed)
 Immediate Anesthesia Transfer of Care Note  Patient: Heather Cunningham  Procedure(s) Performed: COLONOSCOPY POLYPECTOMY, INTESTINE  Patient Location: Short Stay  Anesthesia Type:General  Level of Consciousness: awake, alert , oriented, and patient cooperative  Airway & Oxygen Therapy: Patient Spontanous Breathing  Post-op Assessment: Report given to RN, Post -op Vital signs reviewed and stable, and Patient moving all extremities X 4  Post vital signs: Reviewed and stable  Last Vitals:  Vitals Value Taken Time  BP 98/54 01/05/25 13:31  Temp 36.7 C 01/05/25 13:31  Pulse 58 01/05/25 13:31  Resp 19 01/05/25 13:31  SpO2 96 % 01/05/25 13:31    Last Pain:  Vitals:   01/05/25 1331  TempSrc: Oral  PainSc: Asleep      Patients Stated Pain Goal: 6 (01/05/25 1209)  Complications: No notable events documented.

## 2025-01-06 ENCOUNTER — Encounter (HOSPITAL_COMMUNITY): Payer: Self-pay | Admitting: Internal Medicine

## 2025-01-06 ENCOUNTER — Encounter (INDEPENDENT_AMBULATORY_CARE_PROVIDER_SITE_OTHER): Payer: Self-pay | Admitting: *Deleted

## 2025-01-06 LAB — SURGICAL PATHOLOGY

## 2025-01-08 ENCOUNTER — Ambulatory Visit: Payer: Self-pay | Admitting: Internal Medicine

## 2025-01-20 NOTE — Progress Notes (Signed)
3 yr TCS noted in recall  

## 2025-01-21 ENCOUNTER — Telehealth: Payer: Self-pay

## 2025-01-21 NOTE — Telephone Encounter (Signed)
 ILR reached RRT: 01/20/25 Patient called, discussed options to leave device in or explanted.  Patient would like to leave device in.  Marked I in Paceart: Done Enter note in Paceart: Done Canceled future remotes: Done Discontinued from website: Done Entered in Speciality Comments: Done  Advised if further questions arise to please call the device clinic at [phone].

## 2025-01-22 ENCOUNTER — Ambulatory Visit

## 2025-02-08 ENCOUNTER — Ambulatory Visit

## 2025-02-22 ENCOUNTER — Ambulatory Visit

## 2025-03-25 ENCOUNTER — Ambulatory Visit

## 2025-04-25 ENCOUNTER — Ambulatory Visit

## 2025-05-26 ENCOUNTER — Ambulatory Visit
